# Patient Record
Sex: Female | Born: 1970 | Race: White | Hispanic: No | Marital: Married | Smoking: Never smoker
Health system: Southern US, Community
[De-identification: ages and names within clinical notes are randomized; demographics above are authoritative.]

## PROBLEM LIST (undated history)

## (undated) DIAGNOSIS — E079 Disorder of thyroid, unspecified: Secondary | ICD-10-CM

## (undated) DIAGNOSIS — C50919 Malignant neoplasm of unspecified site of unspecified female breast: Secondary | ICD-10-CM

## (undated) DIAGNOSIS — M1990 Unspecified osteoarthritis, unspecified site: Secondary | ICD-10-CM

## (undated) DIAGNOSIS — F329 Major depressive disorder, single episode, unspecified: Secondary | ICD-10-CM

## (undated) DIAGNOSIS — E288 Other ovarian dysfunction: Secondary | ICD-10-CM

## (undated) DIAGNOSIS — E2839 Other primary ovarian failure: Secondary | ICD-10-CM

## (undated) DIAGNOSIS — F32A Depression, unspecified: Secondary | ICD-10-CM

## (undated) DIAGNOSIS — Z923 Personal history of irradiation: Secondary | ICD-10-CM

## (undated) DIAGNOSIS — E349 Endocrine disorder, unspecified: Secondary | ICD-10-CM

## (undated) DIAGNOSIS — F419 Anxiety disorder, unspecified: Secondary | ICD-10-CM

## (undated) DIAGNOSIS — E039 Hypothyroidism, unspecified: Secondary | ICD-10-CM

## (undated) HISTORY — DX: Endocrine disorder, unspecified: E34.9

## (undated) HISTORY — PX: WISDOM TOOTH EXTRACTION: SHX21

## (undated) HISTORY — DX: Disorder of thyroid, unspecified: E07.9

## (undated) HISTORY — DX: Other ovarian dysfunction: E28.8

## (undated) HISTORY — DX: Other primary ovarian failure: E28.39

## (undated) HISTORY — DX: Malignant neoplasm of unspecified site of unspecified female breast: C50.919

---

## 2015-08-22 ENCOUNTER — Encounter: Payer: Self-pay | Admitting: Obstetrics and Gynecology

## 2015-08-22 ENCOUNTER — Ambulatory Visit (INDEPENDENT_AMBULATORY_CARE_PROVIDER_SITE_OTHER): Payer: Managed Care, Other (non HMO) | Admitting: Obstetrics and Gynecology

## 2015-08-22 VITALS — BP 100/66 | HR 50 | Resp 18 | Ht 62.5 in | Wt 142.8 lb

## 2015-08-22 DIAGNOSIS — Z0000 Encounter for general adult medical examination without abnormal findings: Secondary | ICD-10-CM | POA: Diagnosis not present

## 2015-08-22 DIAGNOSIS — N950 Postmenopausal bleeding: Secondary | ICD-10-CM | POA: Diagnosis not present

## 2015-08-22 DIAGNOSIS — Z23 Encounter for immunization: Secondary | ICD-10-CM

## 2015-08-22 DIAGNOSIS — Z01419 Encounter for gynecological examination (general) (routine) without abnormal findings: Secondary | ICD-10-CM | POA: Diagnosis not present

## 2015-08-22 DIAGNOSIS — E039 Hypothyroidism, unspecified: Secondary | ICD-10-CM | POA: Diagnosis not present

## 2015-08-22 LAB — CBC
HCT: 40.8 % (ref 35.0–45.0)
Hemoglobin: 13.9 g/dL (ref 11.7–15.5)
MCH: 31.1 pg (ref 27.0–33.0)
MCHC: 34.1 g/dL (ref 32.0–36.0)
MCV: 91.3 fL (ref 80.0–100.0)
MPV: 11.3 fL (ref 7.5–12.5)
Platelets: 211 10*3/uL (ref 140–400)
RBC: 4.47 MIL/uL (ref 3.80–5.10)
RDW: 13.6 % (ref 11.0–15.0)
WBC: 6.4 10*3/uL (ref 3.8–10.8)

## 2015-08-22 LAB — ESTRADIOL: Estradiol: 89 pg/mL

## 2015-08-22 LAB — POCT URINALYSIS DIPSTICK
Bilirubin, UA: NEGATIVE
Blood, UA: NEGATIVE
Glucose, UA: NEGATIVE
Ketones, UA: NEGATIVE
Leukocytes, UA: NEGATIVE
Nitrite, UA: NEGATIVE
Protein, UA: NEGATIVE
Urobilinogen, UA: NEGATIVE
pH, UA: 5

## 2015-08-22 LAB — FOLLICLE STIMULATING HORMONE: FSH: 89.6 m[IU]/mL

## 2015-08-22 MED ORDER — ESTRADIOL 0.05 MG/24HR TD PTTW: 1.0000 | MEDICATED_PATCH | TRANSDERMAL | Status: DC

## 2015-08-22 MED ORDER — LEVOTHYROXINE SODIUM 112 MCG PO TABS: 112.0000 ug | ORAL_TABLET | Freq: Every day | ORAL | Status: DC

## 2015-08-22 MED ORDER — PROGESTERONE MICRONIZED 200 MG PO CAPS: 200.0000 mg | ORAL_CAPSULE | Freq: Every day | ORAL | Status: DC

## 2015-08-22 NOTE — Progress Notes (Signed)
Patient ID: Michaela Roman, female   DOB: 1970/11/14, 45 y.o.   MRN: [id number] 45 y.o. [place] Married Caucasian female here for annual exam.    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 Has a menses twice a year.  Feels that there are really menses.  Some vaginal dryness issues.  Hot flashes are well controlled.  PCP:  None--just recently moved from [place]   Patient's last menstrual period was 08/07/2015 (exact date).     Period Pattern: (!) Irregular (usually only 2-3 times/year)     Sexually active: Yes.   female/female(currently female--husband) The current method of family planning is none.    Exercising: Yes.    cardio and weight training 5 days/week. Smoker:  no  Health Maintenance: Pap:  2016 normal per patient in [place] History of abnormal Pap:  no MMG:  2016 normal per [place] Colonoscopy:  n/a BMD:   2010  Result  Normal per [place] in [place]--performed due to hx of POF TDaP:  ?2005--WOULD LIKE TODAY Gardasil:   N/A   Screening Labs:   Urine today: Neg   reports that she has never smoked. She does not have any smokeless tobacco history on file. She reports that she drinks about 1.2 oz of alcohol per week. She reports that she does not use illicit drugs.  Past Medical History  Diagnosis Date  . Hormone disorder   . Thyroid disease     hypothyroid  . Premature ovarian failure     Dx'd age 39    History reviewed. No pertinent past surgical history.  Current Outpatient Prescriptions  Medication Sig Dispense Refill  . estradiol (VIVELLE-DOT) 0.05 MG/24HR patch Place 1 patch onto the skin 2 (two) times a week.    . levothyroxine (SYNTHROID, LEVOTHROID) 112 MCG tablet Take 112 mcg by mouth daily before breakfast.    . Multiple Vitamins-Minerals (MULTIVITAMIN PO) Take 1 tablet by mouth daily.    . Omega 3-6-9 Fatty Acids (OMEGA-3 FUSION) LIQD Take by mouth. Takes 1 tsp. daily    . progesterone (PROMETRIUM) 200 MG capsule Take 200 mg by mouth daily.     No  current facility-administered medications for this visit.    Family History  Problem Relation Age of Onset  . Thyroid disease Sister     hypothyroidism  . Hypertension Maternal Grandmother   . Heart disease Maternal Grandmother   . Diabetes Maternal Grandfather   . Other Maternal Grandfather     benign brain tumor  . Multiple sclerosis Father   . Dementia Paternal Grandmother     ROS:  Pertinent items are noted in HPI.  Otherwise, a comprehensive ROS was negative.  Exam:   BP 100/66 mmHg  Pulse 50  Resp 18  Ht 5' 2.5" (1.588 m)  Wt 142 lb 12.8 oz (64.774 kg)  BMI 25.69 kg/m2  LMP 08/07/2015 (Exact Date)    General appearance: alert, cooperative and appears stated age Head: Normocephalic, without obvious abnormality, atraumatic Neck: no adenopathy, supple, symmetrical, trachea midline and thyroid normal to inspection and palpation Lungs: clear to auscultation bilaterally Breasts: normal appearance, no masses or tenderness, Inspection negative, No nipple retraction or dimpling, No nipple discharge or bleeding, No axillary or supraclavicular adenopathy Heart: regular rate and rhythm Abdomen: incisions:  No.    , soft, non-tender; no masses, no organomegaly Extremities: extremities normal, atraumatic, no cyanosis or edema Skin: Skin color, texture, turgor normal. No rashes or lesions Lymph nodes: Cervical, supraclavicular, and axillary nodes normal. No  abnormal inguinal nodes palpated Neurologic: Grossly normal  Pelvic: External genitalia:  no lesions              Urethra:  normal appearing urethra with no masses, tenderness or lesions              Bartholins and Skenes: normal                 Vagina: normal appearing vagina with normal color and discharge, no lesions              Cervix: no lesions              Pap taken: No. Bimanual Exam:  Uterus:  normal size, contour, position, consistency, mobility, non-tender              Adnexa: normal adnexa and no mass, fullness,  tenderness              Rectal exam: Yes.  .  Confirms.              Anus:  normal sphincter tone, no lesions  Chaperone was present for exam.  Assessment:   Well woman visit with normal exam. Premature ovarian failure.  Postmenopausal bleeding on HRT. Hypothyroidism.  Plan: Yearly mammogram recommended after age 80. Information to do this at the [facility]. Recommended self breast exam.  Pap and HR HPV as above. Discussed Calcium, Vitamin D, regular exercise program including cardiovascular and weight bearing exercise. Labs performed.  Yes.  .   See orders.  Routine labs, TFTs, FSH, estradiol. Prescription medication(s) given.  Yes.  .  See orders.  Vivelle Dot 0.05 mg daily, Prometrium 200 mg daily, Synthroid 112 mcg daily.  I did discuss the benefits of HRT due to premature menopause. Rerturn for pelvic ultrasound. Explained the rationale.  This can evaluate for fibroids, polyps, endometrial hyperplasia and cancer.  She understands the importance of follow up. TDap today.  Follow up annually and prn.       After visit summary provided.

## 2015-08-23 LAB — THYROID PANEL WITH TSH
Free Thyroxine Index: 3 (ref 1.4–3.8)
T3 Uptake: 35 % (ref 22–35)
T4, Total: 8.5 ug/dL (ref 4.5–12.0)
TSH: 4.09 mIU/L

## 2015-08-23 LAB — LIPID PANEL
Cholesterol: 174 mg/dL (ref 125–200)
HDL: 74 mg/dL (ref >=46)
LDL Cholesterol: 88 mg/dL (ref <130)
Total CHOL/HDL Ratio: 2.4 Ratio (ref <=5.0)
Triglycerides: 62 mg/dL (ref <150)
VLDL: 12 mg/dL (ref <30)

## 2015-08-23 LAB — COMPREHENSIVE METABOLIC PANEL
ALT: 15 U/L (ref 6–29)
AST: 21 U/L (ref 10–30)
Albumin: 4.2 g/dL (ref 3.6–5.1)
Alkaline Phosphatase: 35 U/L (ref 33–115)
BUN: 15 mg/dL (ref 7–25)
CO2: 25 mmol/L (ref 20–31)
Calcium: 9.2 mg/dL (ref 8.6–10.2)
Chloride: 103 mmol/L (ref 98–110)
Creat: 0.95 mg/dL (ref 0.50–1.10)
Glucose, Bld: 85 mg/dL (ref 65–99)
Potassium: 4.1 mmol/L (ref 3.5–5.3)
Sodium: 139 mmol/L (ref 135–146)
Total Bilirubin: 0.6 mg/dL (ref 0.2–1.2)
Total Protein: 6.8 g/dL (ref 6.1–8.1)

## 2015-08-23 LAB — VITAMIN D 25 HYDROXY (VIT D DEFICIENCY, FRACTURES): Vit D, 25-Hydroxy: 32 ng/mL (ref 30–100)

## 2015-08-27 ENCOUNTER — Telehealth: Payer: Self-pay | Admitting: *Deleted

## 2015-08-27 DIAGNOSIS — N950 Postmenopausal bleeding: Secondary | ICD-10-CM

## 2015-08-27 NOTE — Telephone Encounter (Signed)
-----   Message from Nunzio Cobbs, MD sent at 08/24/2015 12:40 PM EDT ----- Please report to the patient her labs from her office visit.   She is menopausal by the [facility] level.  Her estradiol level is elevated, but she is using estrogen replacement (and progesterone) for premature menopause. I do recommend proceeding forward with her evaluation for postmenopausal bleeding now that I have seen the [facility] level. I already discussed with her and ordered a pelvic ultrasound, and I now would like to add a sonohysterogram and EMB.  These latter two will need to be explained to the patient and precerted. For this type of evaluation, I start with the pelvic ultrasound and then at that visit, determine the necessity of proceeding forward with the sonohysterogram and EMB depending on the findings of the ultrasound.  The remainder of her tests were normal - lipids, vitamin D, CBC, thyroid panel, and CMP.  Cc- Marisa Sprinkles

## 2015-08-27 NOTE — Telephone Encounter (Signed)
Patient notified of lab results as directed by Dr Quincy Simmonds. Advised of recommendation to add SHGM and endo biopsy to pelvic ultrasound. Procedures explained to patient. Instructed to take Motrin 800 mg one hour prior with food. Appointment scheduled for 09-04-15 at 1100 with Dr Quincy Simmonds.   Routing to provider for final review. Patient agreeable to disposition. Will close encounter.

## 2015-09-04 ENCOUNTER — Other Ambulatory Visit: Payer: Self-pay | Admitting: Obstetrics and Gynecology

## 2015-09-04 ENCOUNTER — Encounter: Payer: Self-pay | Admitting: Obstetrics and Gynecology

## 2015-09-04 ENCOUNTER — Ambulatory Visit (INDEPENDENT_AMBULATORY_CARE_PROVIDER_SITE_OTHER): Payer: Managed Care, Other (non HMO)

## 2015-09-04 ENCOUNTER — Ambulatory Visit (INDEPENDENT_AMBULATORY_CARE_PROVIDER_SITE_OTHER): Payer: Managed Care, Other (non HMO) | Admitting: Obstetrics and Gynecology

## 2015-09-04 VITALS — BP 102/60 | HR 64 | Resp 14 | Ht 62.5 in | Wt 142.0 lb

## 2015-09-04 DIAGNOSIS — N950 Postmenopausal bleeding: Secondary | ICD-10-CM | POA: Diagnosis not present

## 2015-09-04 DIAGNOSIS — N9489 Other specified conditions associated with female genital organs and menstrual cycle: Secondary | ICD-10-CM

## 2015-09-04 NOTE — Progress Notes (Signed)
GYNECOLOGY  VISIT   HPI: 45 y.o.   Married  Caucasian  female   G0P0000 with Patient's last menstrual period was 08/07/2015 (exact date).   here for   Postmenopausal bleeding.   Husband present for the discussion portion of the visit.  Hx premature menopause.  On Vivelle Dot and Prometrium. Bleeding today.  Switched patch late.  Recent labs: [place] - 89 E2 - 89.  GYNECOLOGIC HISTORY: Patient's last menstrual period was 08/07/2015 (exact date).   Menopausal hormone therapy:  Vivelle Dot, Prometrium. Last mammogram:  2016 - [place], [place]. Last pap smear:  2016 - [place], [place].        OB History    Gravida Para Term Preterm AB TAB SAB Ectopic Multiple Living   0 0 0 0 0 0 0 0 0 0          There are no active problems to display for this patient.   Past Medical History  Diagnosis Date  . Hormone disorder   . Thyroid disease     hypothyroid  . Premature ovarian failure     Dx'd age 42    History reviewed. No pertinent past surgical history.  Current Outpatient Prescriptions  Medication Sig Dispense Refill  . estradiol (VIVELLE-DOT) 0.05 MG/24HR patch Place 1 patch (0.05 mg total) onto the skin 2 (two) times a week. 8 patch 11  . levothyroxine (SYNTHROID, LEVOTHROID) 112 MCG tablet Take 1 tablet (112 mcg total) by mouth daily before breakfast. 30 tablet 11  . Multiple Vitamins-Minerals (MULTIVITAMIN PO) Take 1 tablet by mouth daily.    . Omega 3-6-9 Fatty Acids (OMEGA-3 FUSION) LIQD Take by mouth. Takes 1 tsp. daily    . progesterone (PROMETRIUM) 200 MG capsule Take 1 capsule (200 mg total) by mouth daily. 60 capsule 11   No current facility-administered medications for this visit.     ALLERGIES: Review of patient's allergies indicates no known allergies.  Family History  Problem Relation Age of Onset  . Thyroid disease Sister     hypothyroidism  . Hypertension Maternal Grandmother   . Heart disease Maternal Grandmother   . Diabetes Maternal Grandfather   . Other  Maternal Grandfather     benign brain tumor  . Multiple sclerosis Father   . Dementia Paternal Grandmother     Social History   Social History  . Marital Status: Married    Spouse Name: N/A  . Number of Children: N/A  . Years of Education: N/A   Occupational History  . Not on file.   Social History Main Topics  . Smoking status: Never Smoker   . Smokeless tobacco: Not on file  . Alcohol Use: 1.2 oz/week    2 Standard drinks or equivalent per week  . Drug Use: No  . Sexual Activity:    Partners: Female, Female    Birth Control/ Protection: None   Other Topics Concern  . Not on file   Social History Narrative    ROS:  Pertinent items are noted in HPI.  PHYSICAL EXAMINATION:    BP 102/60 mmHg  Pulse 64  Resp 14  Ht 5' 2.5" (1.588 m)  Wt 142 lb (64.411 kg)  BMI 25.54 kg/m2  LMP 08/07/2015 (Exact Date)    General appearance: alert, cooperative and appears stated age   Technique:  Both transabdominal and transvaginal ultrasound examinations of the pelvis were performed. Transabdominal technique was performed for global imaging of the pelvis including uterus, ovaries, adnexal regions, and pelvic cul-de-sac. It was  necessary to proceed with endovaginal exam following the abdominal ultrasound.  Transabdominal exam to visualize the endometrium and adnexa.  Color and duplex Doppler ultrasound was utilized to evaluate blood flow to the ovaries.   Pelvic ultrasound Uterus with no mass.  EMS - 5.11 mm. Normal ovaries.  No free fluid.  Procedure - sonohysterogram Consent performed. Speculum placed in vagina. Sterile prep of cervix with Hibiclens Cannula placed inside endometrial cavity without difficulty. Speculum removed. Sterile saline injected.      1        filling defect noted: 9 x 5 mm. Cannula removed. No complication.   Procedure - endometrial biopsy Consent performed. Speculum place in vagina.  Sterile prep of cervix with  Hibiclens Tenaculum to anterior  cervical lip.   Pipelle placed to     7     cm without difficulty twice. Tissue obtained and sent to pathology. Speculum removed.  No complications. Minimal EBL.  ASSESSMENT  Postmenopausal bleeding.  Endometrial mass.  On HRT.  PLAN  Discussion of postmenopausal bleeding, etiologies, evaluation and treatment.  Follow up EMB - sent to [facility].  Biopsy precautions given.  Discussion of hysteroscopy with Myosure polypectomy, dilation and curettage.  Risks, benefits, and alternatives reviewed. Risks include but are not limited to bleeding, infection, damage to surrounding organs including uterine perforation requiring hospitalization and laparoscopy, pulmonary edema, reaction to anesthesia, DVT, PE, death, need for further treatment and surgery including repeat hysteroscopy, hysterectomy or medical therapy.   Surgical expectations and recovery discussed.  Patient wishes to proceed.   An After Visit Summary was printed and given to the patient.  _25_____ minutes face to face time of which over 50% was spent in counseling.

## 2015-09-04 NOTE — Patient Instructions (Signed)

## 2015-09-11 ENCOUNTER — Telehealth: Payer: Self-pay | Admitting: Obstetrics and Gynecology

## 2015-09-11 NOTE — Telephone Encounter (Signed)
Please keep patient in the work queue and call her back in one week if she does not return the call to discuss scheduling.

## 2015-09-11 NOTE — Telephone Encounter (Signed)
Spoke with pt regarding benefit for recommended surgery. Patient understood and agreeable. Patient aware this is professional benefit only. Patient request to contact hospital for their benefits and states she will call back when she is ready to schedule.  Routing to Dr Quincy Simmonds  cc; Lamont Snowball

## 2015-09-12 ENCOUNTER — Encounter (HOSPITAL_COMMUNITY): Payer: Self-pay | Admitting: *Deleted

## 2015-09-12 NOTE — Telephone Encounter (Signed)
Call to patient. Surgery date options discussed. Patient requests 10-07-15. Surgery scheduled for 10-07-15 at [facility], time to be determined. Surgery instructions reviewed and printed surgery sheet will be mailed to patient. Questions answered and appointments scheduled.  Routing to provider for final review. Patient agreeable to disposition. Will close encounter.

## 2015-09-12 NOTE — Telephone Encounter (Signed)
Patient returned call and provided benefit in full. Patient ready to schedule. Forward to [place] for scheduling. Surgery chart to [facility].

## 2015-09-26 ENCOUNTER — Ambulatory Visit (INDEPENDENT_AMBULATORY_CARE_PROVIDER_SITE_OTHER): Payer: Managed Care, Other (non HMO) | Admitting: Obstetrics and Gynecology

## 2015-09-26 ENCOUNTER — Encounter: Payer: Self-pay | Admitting: Obstetrics and Gynecology

## 2015-09-26 VITALS — BP 110/70 | HR 56 | Ht 62.5 in

## 2015-09-26 DIAGNOSIS — N950 Postmenopausal bleeding: Secondary | ICD-10-CM

## 2015-09-26 DIAGNOSIS — N9489 Other specified conditions associated with female genital organs and menstrual cycle: Secondary | ICD-10-CM

## 2015-09-26 NOTE — Patient Instructions (Signed)
I will see you the day of surgery! 

## 2015-09-26 NOTE — Progress Notes (Signed)
GYNECOLOGY  VISIT   HPI: 45 y.o.   Married  Caucasian  female   G0P0000 with Patient's last menstrual period was 08/07/2015 (approximate).   here for surgical consult.     Endometrial mass noted on pelvic ultrasound/sonohysterogram ordered for postmenopausal bleeding. 9 x 5 mm mass noted. EMB showed benign proliferative endometrium.  Patient is on HRT for premature menopause. Recent labs: [place] - 89 E2 - 89.  Will do preop visit at hospital the day of the surgery.   GYNECOLOGIC HISTORY: Patient's last menstrual period was 08/07/2015 (approximate). Contraception:  Postmenopausal Menopausal hormone therapy:  Vivelle-Dot, Prometrium 200mg  Last mammogram:  [place] per [place] in [place] Last pap smear:   2016 [place]        OB History    Gravida Para Term Preterm AB Living   0 0 0 0 0 0   SAB TAB Ectopic Multiple Live Births   0 0 0 0           There are no active problems to display for this patient.   Past Medical History:  Diagnosis Date  . Anxiety    no meds  . Depression    no meds  . Hormone disorder   . Hypothyroidism   . Premature ovarian failure    Dx'd age 50  . Thyroid disease    hypothyroid    Past Surgical History:  Procedure Laterality Date  . WISDOM TOOTH EXTRACTION      Current Outpatient Prescriptions  Medication Sig Dispense Refill  . estradiol (VIVELLE-DOT) 0.05 MG/24HR patch Place 1 patch (0.05 mg total) onto the skin 2 (two) times a week. 8 patch 11  . levothyroxine (SYNTHROID, LEVOTHROID) 112 MCG tablet Take 1 tablet (112 mcg total) by mouth daily before breakfast. 30 tablet 11  . Multiple Vitamins-Minerals (MULTIVITAMIN PO) Take 1 tablet by mouth daily.    . Omega 3-6-9 Fatty Acids (OMEGA-3 FUSION) LIQD Take by mouth. Takes 1 tsp. daily    . progesterone (PROMETRIUM) 200 MG capsule Take 1 capsule (200 mg total) by mouth daily. 60 capsule 11   No current facility-administered medications for this visit.      ALLERGIES:  Review of patient's allergies indicates no known allergies.  Family History  Problem Relation Age of Onset  . Thyroid disease Sister     hypothyroidism  . Hypertension Maternal Grandmother   . Heart disease Maternal Grandmother   . Diabetes Maternal Grandfather   . Other Maternal Grandfather     benign brain tumor  . Multiple sclerosis Father   . Dementia Paternal Grandmother     Social History   Social History  . Marital status: Married    Spouse name: N/A  . Number of children: N/A  . Years of education: N/A   Occupational History  . Not on file.   Social History Main Topics  . Smoking status: Never Smoker  . Smokeless tobacco: Never Used  . Alcohol use 2.4 oz/week    2 Standard drinks or equivalent, 2 Glasses of wine per week  . Drug use: No  . Sexual activity: Yes    Partners: Female, Female    Birth control/ protection: None   Other Topics Concern  . Not on file   Social History Narrative  . No narrative on file    ROS:  Pertinent items are noted in HPI.  PHYSICAL EXAMINATION:    LMP 08/07/2015 (Approximate)     General appearance: alert, cooperative  and appears stated age Head: Normocephalic, without obvious abnormality, atraumatic Neck: no adenopathy, supple, symmetrical, trachea midline and thyroid normal to inspection and palpation Lungs: clear to auscultation bilaterally Breasts: normal appearance, no masses or tenderness, No nipple retraction or dimpling, No nipple discharge or bleeding, No axillary or supraclavicular adenopathy Heart: regular rate and rhythm Abdomen: soft, non-tender, no masses,  no organomegaly Extremities: extremities normal, atraumatic, no cyanosis or edema Skin: Skin color, texture, turgor normal. No rashes or lesions Lymph nodes: Cervical, supraclavicular, and axillary nodes normal. No abnormal inguinal nodes palpated Neurologic: Grossly normal  Pelvic: External genitalia:  no lesions              Urethra:  normal appearing  urethra with no masses, tenderness or lesions              Bartholins and Skenes: normal                 Vagina: normal appearing vagina with normal color and discharge, no lesions              Cervix: no lesions                Bimanual Exam:  Uterus:  normal size, contour, position, consistency, mobility, non-tender              Adnexa: no mass, fullness, tenderness              Chaperone was present for exam.  ASSESSMENT  Postmenopausal bleeding with endometrial mass and negative EMB. Premature menopause.  On HRT.  PLAN  Proceed with hysteroscopy with dilation and curettage.  Risks, benefits, and alternatives discussed with the patient who wishes to proceed.  I did review the possibility of a negative hysteroscopy. Ok to continue with HRT at this time.    An After Visit Summary was printed and given to the patient.  __15____ minutes face to face time of which over 50% was spent in counseling.

## 2015-10-01 ENCOUNTER — Telehealth: Payer: Self-pay | Admitting: *Deleted

## 2015-10-01 NOTE — Telephone Encounter (Signed)
Call to patient. Advised surgery time adjusted to 0730 and arrival at 0600 on 10-07-15 at [place] to provider for final review. Patient agreeable to disposition. Will close encounter.

## 2015-10-07 ENCOUNTER — Ambulatory Visit (HOSPITAL_COMMUNITY)
Admission: RE | Admit: 2015-10-07 | Discharge: 2015-10-07 | Disposition: A | Discharge status: Home / Self Care | Payer: Managed Care, Other (non HMO) | Source: Ambulatory Visit | Attending: Obstetrics and Gynecology | Admitting: Obstetrics and Gynecology

## 2015-10-07 ENCOUNTER — Encounter (HOSPITAL_COMMUNITY): Payer: Self-pay | Admitting: *Deleted

## 2015-10-07 ENCOUNTER — Encounter (HOSPITAL_COMMUNITY)
Admission: RE | Disposition: A | Discharge status: Home / Self Care | Payer: Self-pay | Source: Ambulatory Visit | Attending: Obstetrics and Gynecology

## 2015-10-07 ENCOUNTER — Ambulatory Visit (HOSPITAL_COMMUNITY): Payer: Managed Care, Other (non HMO) | Admitting: Certified Registered Nurse Anesthetist

## 2015-10-07 DIAGNOSIS — E039 Hypothyroidism, unspecified: Secondary | ICD-10-CM | POA: Insufficient documentation

## 2015-10-07 DIAGNOSIS — Z79890 Hormone replacement therapy (postmenopausal): Secondary | ICD-10-CM | POA: Insufficient documentation

## 2015-10-07 DIAGNOSIS — N840 Polyp of corpus uteri: Secondary | ICD-10-CM | POA: Insufficient documentation

## 2015-10-07 DIAGNOSIS — N950 Postmenopausal bleeding: Secondary | ICD-10-CM | POA: Insufficient documentation

## 2015-10-07 HISTORY — DX: Hypothyroidism, unspecified: E03.9

## 2015-10-07 HISTORY — DX: Depression, unspecified: F32.A

## 2015-10-07 HISTORY — PX: DILATATION & CURETTAGE/HYSTEROSCOPY WITH MYOSURE: SHX6511

## 2015-10-07 HISTORY — DX: Anxiety disorder, unspecified: F41.9

## 2015-10-07 HISTORY — DX: Major depressive disorder, single episode, unspecified: F32.9

## 2015-10-07 LAB — CBC
HCT: 37.7 % (ref 36.0–46.0)
Hemoglobin: 13.3 g/dL (ref 12.0–15.0)
MCH: 31.6 pg (ref 26.0–34.0)
MCHC: 35.3 g/dL (ref 30.0–36.0)
MCV: 89.5 fL (ref 78.0–100.0)
Platelets: 196 10*3/uL (ref 150–400)
RBC: 4.21 MIL/uL (ref 3.87–5.11)
RDW: 13 % (ref 11.5–15.5)
WBC: 5.4 10*3/uL (ref 4.0–10.5)

## 2015-10-07 LAB — PREGNANCY, URINE: Preg Test, Ur: NEGATIVE

## 2015-10-07 SURGERY — DILATATION & CURETTAGE/HYSTEROSCOPY WITH MYOSURE
Anesthesia: Choice

## 2015-10-07 MED ORDER — DEXAMETHASONE SODIUM PHOSPHATE 10 MG/ML IJ SOLN
INTRAMUSCULAR | Status: DC | PRN
  Administered 2015-10-07: 4 mg via INTRAVENOUS

## 2015-10-07 MED ORDER — SCOPOLAMINE 1 MG/3DAYS TD PT72
1.0000 | MEDICATED_PATCH | Freq: Once | TRANSDERMAL | Status: DC
  Administered 2015-10-07: 1.5 mg via TRANSDERMAL

## 2015-10-07 MED ORDER — LACTATED RINGERS IV SOLN
INTRAVENOUS | Status: DC
  Administered 2015-10-07: 125 mL/h via INTRAVENOUS

## 2015-10-07 MED ORDER — KETOROLAC TROMETHAMINE 30 MG/ML IJ SOLN
INTRAMUSCULAR | Status: AC
  Filled 2015-10-07: qty 1

## 2015-10-07 MED ORDER — GLYCOPYRROLATE 0.2 MG/ML IJ SOLN
INTRAMUSCULAR | Status: DC | PRN
  Administered 2015-10-07: 0.2 mg via INTRAVENOUS

## 2015-10-07 MED ORDER — MIDAZOLAM HCL 2 MG/2ML IJ SOLN
INTRAMUSCULAR | Status: DC | PRN
  Administered 2015-10-07: 2 mg via INTRAVENOUS

## 2015-10-07 MED ORDER — LIDOCAINE HCL (CARDIAC) 20 MG/ML IV SOLN
INTRAVENOUS | Status: DC | PRN
  Administered 2015-10-07: 100 mg via INTRAVENOUS

## 2015-10-07 MED ORDER — LIDOCAINE HCL 1 % IJ SOLN
INTRAMUSCULAR | Status: DC | PRN
  Administered 2015-10-07: 10 mL

## 2015-10-07 MED ORDER — MEPERIDINE HCL 25 MG/ML IJ SOLN: 6.2500 mg | INTRAMUSCULAR | Status: DC | PRN

## 2015-10-07 MED ORDER — MIDAZOLAM HCL 2 MG/2ML IJ SOLN
INTRAMUSCULAR | Status: AC
  Filled 2015-10-07: qty 2

## 2015-10-07 MED ORDER — HYDROMORPHONE HCL 1 MG/ML IJ SOLN
INTRAMUSCULAR | Status: DC
Start: 2015-10-07 — End: 2015-10-07
  Filled 2015-10-07: qty 1

## 2015-10-07 MED ORDER — PROPOFOL 10 MG/ML IV BOLUS
INTRAVENOUS | Status: AC
  Filled 2015-10-07: qty 20

## 2015-10-07 MED ORDER — LIDOCAINE HCL 1 % IJ SOLN
INTRAMUSCULAR | Status: AC
  Filled 2015-10-07: qty 20

## 2015-10-07 MED ORDER — GLYCOPYRROLATE 0.2 MG/ML IJ SOLN
INTRAMUSCULAR | Status: AC
  Filled 2015-10-07: qty 1

## 2015-10-07 MED ORDER — ONDANSETRON HCL 4 MG/2ML IJ SOLN: 4.0000 mg | Freq: Once | INTRAMUSCULAR | Status: DC | PRN

## 2015-10-07 MED ORDER — SODIUM CHLORIDE 0.9 % IR SOLN
Status: DC | PRN
  Administered 2015-10-07: 3000 mL

## 2015-10-07 MED ORDER — FENTANYL CITRATE (PF) 100 MCG/2ML IJ SOLN
INTRAMUSCULAR | Status: DC | PRN
  Administered 2015-10-07: 25 ug via INTRAVENOUS

## 2015-10-07 MED ORDER — ONDANSETRON HCL 4 MG/2ML IJ SOLN
INTRAMUSCULAR | Status: DC | PRN
  Administered 2015-10-07: 4 mg via INTRAVENOUS

## 2015-10-07 MED ORDER — FENTANYL CITRATE (PF) 100 MCG/2ML IJ SOLN
INTRAMUSCULAR | Status: AC
  Filled 2015-10-07: qty 2

## 2015-10-07 MED ORDER — KETOROLAC TROMETHAMINE 30 MG/ML IJ SOLN
INTRAMUSCULAR | Status: DC | PRN
  Administered 2015-10-07: 30 mg via INTRAVENOUS

## 2015-10-07 MED ORDER — SCOPOLAMINE 1 MG/3DAYS TD PT72
MEDICATED_PATCH | TRANSDERMAL | Status: AC
  Administered 2015-10-07: 1.5 mg via TRANSDERMAL
  Filled 2015-10-07: qty 1

## 2015-10-07 MED ORDER — IBUPROFEN 800 MG PO TABS: 800.0000 mg | ORAL_TABLET | Freq: Three times a day (TID) | ORAL | 0 refills | Status: DC | PRN

## 2015-10-07 MED ORDER — PROPOFOL 10 MG/ML IV BOLUS
INTRAVENOUS | Status: DC | PRN
  Administered 2015-10-07: 200 mg via INTRAVENOUS

## 2015-10-07 MED ORDER — LACTATED RINGERS IV SOLN
INTRAVENOUS | Status: DC
  Administered 2015-10-07: 07:00:00 via INTRAVENOUS

## 2015-10-07 MED ORDER — ONDANSETRON HCL 4 MG/2ML IJ SOLN
INTRAMUSCULAR | Status: AC
  Filled 2015-10-07: qty 2

## 2015-10-07 MED ORDER — DEXAMETHASONE SODIUM PHOSPHATE 4 MG/ML IJ SOLN
INTRAMUSCULAR | Status: AC
  Filled 2015-10-07: qty 1

## 2015-10-07 MED ORDER — LIDOCAINE HCL (CARDIAC) 20 MG/ML IV SOLN
INTRAVENOUS | Status: AC
  Filled 2015-10-07: qty 5

## 2015-10-07 MED ORDER — HYDROMORPHONE HCL 1 MG/ML IJ SOLN
0.2500 mg | INTRAMUSCULAR | Status: DC | PRN
  Administered 2015-10-07: 0.25 mg via INTRAVENOUS

## 2015-10-07 SURGICAL SUPPLY — 20 items
CANISTER SUCT 3000ML (MISCELLANEOUS) ×4 IMPLANT
CATH ROBINSON RED A/P 16FR (CATHETERS) ×2 IMPLANT
CLOTH BEACON ORANGE TIMEOUT ST (SAFETY) ×2 IMPLANT
CONTAINER PREFILL 10% NBF 60ML (FORM) ×4 IMPLANT
DEVICE MYOSURE LITE (MISCELLANEOUS) ×2 IMPLANT
DEVICE MYOSURE REACH (MISCELLANEOUS) IMPLANT
ELECT REM PT RETURN 9FT ADLT (ELECTROSURGICAL)
ELECTRODE REM PT RTRN 9FT ADLT (ELECTROSURGICAL) IMPLANT
FILTER ARTHROSCOPY CONVERTOR (FILTER) ×2 IMPLANT
GLOVE BIO SURGEON STRL SZ 6.5 (GLOVE) ×2 IMPLANT
GLOVE BIOGEL PI IND STRL 7.0 (GLOVE) ×2 IMPLANT
GLOVE BIOGEL PI INDICATOR 7.0 (GLOVE) ×2
GOWN STRL REUS W/TWL LRG LVL3 (GOWN DISPOSABLE) ×4 IMPLANT
PACK VAGINAL MINOR WOMEN LF (CUSTOM PROCEDURE TRAY) ×2 IMPLANT
PAD OB MATERNITY 4.3X12.25 (PERSONAL CARE ITEMS) ×2 IMPLANT
SEAL ROD LENS SCOPE MYOSURE (ABLATOR) ×2 IMPLANT
TOWEL OR 17X24 6PK STRL BLUE (TOWEL DISPOSABLE) ×4 IMPLANT
TUBING AQUILEX INFLOW (TUBING) ×2 IMPLANT
TUBING AQUILEX OUTFLOW (TUBING) ×2 IMPLANT
WATER STERILE IRR 1000ML POUR (IV SOLUTION) ×2 IMPLANT

## 2015-10-07 NOTE — Progress Notes (Signed)
Update to History and Physical  Has had some continued bleeding.   Patient examined.   OK to proceed with surgery.

## 2015-10-07 NOTE — Anesthesia Procedure Notes (Signed)
Procedure Name: LMA Insertion Date/Time: 10/07/2015 7:20 AM Performed by: Hewitt Blade Pre-anesthesia Checklist: Patient identified, Emergency Drugs available, Suction available and Patient being monitored Patient Re-evaluated:Patient Re-evaluated prior to inductionOxygen Delivery Method: Circle system utilized Preoxygenation: Pre-oxygenation with 100% oxygen Intubation Type: IV induction LMA Size: 4.0 Number of attempts: 1 Placement Confirmation: breath sounds checked- equal and bilateral and positive ETCO2 Tube secured with: Tape Dental Injury: Teeth and Oropharynx as per pre-operative assessment

## 2015-10-07 NOTE — H&P (Signed)
Office Visit   09/26/2015 [place], MD  Obstetrics and Gynecology   Postmenopausal bleeding +1 more  Dx   Surgical Consult   Reason for Visit   Progress Notes     GYNECOLOGY  VISIT   HPI: 45 y.o.   Married  Caucasian  female   G0P0000 with Patient's last menstrual period was 08/07/2015 (approximate).   here for surgical consult.     Endometrial mass noted on pelvic ultrasound/sonohysterogram ordered for postmenopausal bleeding. 9 x 5 mm mass noted. EMB showed benign proliferative endometrium.  Patient is on HRT for premature menopause. Recent labs: [place] - 89 E2 - 89.  Will do preop visit at hospital the day of the surgery.   GYNECOLOGIC HISTORY: Patient's last menstrual period was 08/07/2015 (approximate). Contraception:  Postmenopausal Menopausal hormone therapy:  Vivelle-Dot, Prometrium 200mg  Last mammogram:  2016 [place] in [place] Last pap smear:   2016 [place] in [place]                OB History    Gravida Para Term Preterm AB Living   0 0 0 0 0 0   SAB TAB Ectopic Multiple Live Births   0 0 0 0           There are no active problems to display for this patient.       Past Medical History:  Diagnosis Date  . Anxiety    no meds  . Depression    no meds  . Hormone disorder   . Hypothyroidism   . Premature ovarian failure    Dx'd age 3  . Thyroid disease    hypothyroid         Past Surgical History:  Procedure Laterality Date  . WISDOM TOOTH EXTRACTION            Current Outpatient Prescriptions  Medication Sig Dispense Refill  . estradiol (VIVELLE-DOT) 0.05 MG/24HR patch Place 1 patch (0.05 mg total) onto the skin 2 (two) times a week. 8 patch 11  . levothyroxine (SYNTHROID, LEVOTHROID) 112 MCG tablet Take 1 tablet (112 mcg total) by mouth daily before breakfast. 30 tablet 11  . Multiple Vitamins-Minerals (MULTIVITAMIN PO) Take 1 tablet by mouth daily.     . Omega 3-6-9 Fatty Acids (OMEGA-3 FUSION) LIQD Take by mouth. Takes 1 tsp. daily    . progesterone (PROMETRIUM) 200 MG capsule Take 1 capsule (200 mg total) by mouth daily. 60 capsule 11   No current facility-administered medications for this visit.      ALLERGIES: Review of patient's allergies indicates no known allergies.        Family History  Problem Relation Age of Onset  . Thyroid disease Sister     hypothyroidism  . Hypertension Maternal Grandmother   . Heart disease Maternal Grandmother   . Diabetes Maternal Grandfather   . Other Maternal Grandfather     benign brain tumor  . Multiple sclerosis Father   . Dementia Paternal Grandmother     Social History        Social History  . Marital status: Married    Spouse name: N/A  . Number of children: N/A  . Years of education: N/A      Occupational History  . Not on file.        Social History Main Topics  . Smoking status: Never Smoker  . Smokeless tobacco: Never Used  . Alcohol use 2.4  oz/week    2 Standard drinks or equivalent, 2 Glasses of wine per week  . Drug use: No  . Sexual activity: Yes    Partners: Female, Female    Birth control/ protection: None       Other Topics Concern  . Not on file      Social History Narrative  . No narrative on file    ROS:  Pertinent items are noted in HPI.  PHYSICAL EXAMINATION:    LMP 08/07/2015 (Approximate)     General appearance: alert, cooperative and appears stated age Head: Normocephalic, without obvious abnormality, atraumatic Neck: no adenopathy, supple, symmetrical, trachea midline and thyroid normal to inspection and palpation Lungs: clear to auscultation bilaterally Breasts: normal appearance, no masses or tenderness, No nipple retraction or dimpling, No nipple discharge or bleeding, No axillary or supraclavicular adenopathy Heart: regular rate and rhythm Abdomen: soft, non-tender, no masses,  no  organomegaly Extremities: extremities normal, atraumatic, no cyanosis or edema Skin: Skin color, texture, turgor normal. No rashes or lesions Lymph nodes: Cervical, supraclavicular, and axillary nodes normal. No abnormal inguinal nodes palpated Neurologic: Grossly normal  Pelvic: External genitalia:  no lesions              Urethra:  normal appearing urethra with no masses, tenderness or lesions              Bartholins and Skenes: normal                 Vagina: normal appearing vagina with normal color and discharge, no lesions              Cervix: no lesions                Bimanual Exam:  Uterus:  normal size, contour, position, consistency, mobility, non-tender              Adnexa: no mass, fullness, tenderness              Chaperone was present for exam.  ASSESSMENT  Postmenopausal bleeding with endometrial mass and negative EMB. Premature menopause.  On HRT.  PLAN  Proceed with hysteroscopy with dilation and curettage.  Risks, benefits, and alternatives discussed with the patient who wishes to proceed.  I did review the possibility of a negative hysteroscopy. Ok to continue with HRT at this time.    An After Visit Summary was printed and given to the patient.  __15____ minutes face to face time of which over 50% was spent in counseling.

## 2015-10-07 NOTE — Anesthesia Postprocedure Evaluation (Signed)
Anesthesia Post Note  Patient: Shawn Eisenstadt  Procedure(s) Performed: Procedure(s) (LRB): DILATATION & CURETTAGE/HYSTEROSCOPY WITH MYOSURE (N/A)  Patient location during evaluation: PACU Anesthesia Type: General Level of consciousness: awake and alert Pain management: pain level controlled Vital Signs Assessment: post-procedure vital signs reviewed and stable Respiratory status: spontaneous breathing, nonlabored ventilation, respiratory function stable and patient connected to nasal cannula oxygen Cardiovascular status: blood pressure returned to baseline and stable Postop Assessment: no signs of nausea or vomiting Anesthetic complications: no     Last Vitals:  Vitals:   10/07/15 0900 10/07/15 0915  BP: 107/65 105/69  Pulse: (!) 46 (!) 57  Resp: 14 13  Temp:  36.8 C    Last Pain:  Vitals:   10/07/15 0915  TempSrc:   PainSc: 2    Pain Goal: Patients Stated Pain Goal: 4 (10/07/15 0915)               Azyria Osmon DAVID

## 2015-10-07 NOTE — Op Note (Signed)
OPERATIVE REPORT   PREOPERATIVE DIAGNOSES:   Postmenopausal bleeding, endometrial polyp.  POSTOPERATIVE DIAGNOSES:   Postmenopausal bleeding, endometrial polyp.  PROCEDURE:  Hysteroscopy with dilation and curettage and Myosure resection of endometrial polyp.  SURGEON:  Lenard Galloway, MD  ANESTHESIA:  LMA, paracervical block with 10 mL of 1% lidocaine.  IV FLUIDS:   700 cc LR.  EBL:  5 cc.  URINE OUTPUT:   50 cc.  NORMAL SALINE DEFICIT:   None.  COMPLICATIONS:  None.  INDICATIONS FOR THE PROCEDURE:     The patient is a 45 year old G82 Caucasian female who presents with postmenopausal bleeding on hormone replacement therapy for premature menopause.  Sonohysterogram in the office diagnosed a 9 x 5 mm endometrial mass.   A plan is now made to proceed with a hysteroscopy with dilation and curettage and Myosure resection of endometrial polyp after risks, benefits and alternatives were reviewed.  FINDINGS:  Exam under anesthesia revealed a small anteverted, mobile uterus. The uterus was sounded to 8 cm. Hysteroscopy showed a 10 x 5 mm polyp attached to the right lateral lower uterine segment near the junction of the cervix. Endometrial currettings were scanty.  SPECIMENS:  The endometrial polyp and endometrial curettings were sent to Pathology separately.   PROCEDURE IN DETAIL:  The patient was reidentified in the preoperative hold area.  She received TED hose and PAS stockings for DVT prophylaxis.  In the operating room, the patient was placed in the dorsal lithotomy position and then an LMA anesthetic was introduced.  The patient's lower abdomen, vagina and perineum were sterilely prepped with Betadine and the  patient's bladder was catheterized of urine.  She was sterilly draped  An exam under anesthesia was performed.  A speculum was placed inside the vagina and a single-tooth tenaculum was placed on the anterior cervical lip.  A paracervical  block was performed with a total of 10 mL of 1% lidocaine plain.  The uterus was sounded. The cervix was dilated to a #23 Pratt dilator.  The MyoSure hysteroscope was then inserted inside the uterine cavity under the continuous infusion of normal saline solution.  Findings are as noted above.  The MyoSure hysteroscope was used to resect the polyp, and it was then removed.  The Specimen was sent to Pathology.  The serrated and then sharp curette were introduced  into the uterine cavity and the endometrium was curetted in all 4 quadrants.  A minimal amount of endometrial curettings was obtained.  This specimen was sent to Pathology.  The single-tooth tenaculum which had been placed on the anterior cervical lip was removed.  Hemostasis was good, and all of the vaginal instruments were  removed.  The patient was awakened and escorted to the recovery room in stable condition after she was cleansed with Betadine.  There were no complications to the procedure.  All needle, instrument and sponge counts were correct.  Lenard Galloway, MD

## 2015-10-07 NOTE — Discharge Instructions (Signed)
Post Anesthesia Home Care Instructions  Activity: Get plenty of rest for the remainder of the day. A responsible adult should stay with you for 24 hours following the procedure.  For the next 24 hours, DO NOT: -Drive a car -Paediatric nurse -Drink alcoholic beverages -Take any medication unless instructed by your physician -Make any legal decisions or sign important papers.  Meals: Start with liquid foods such as gelatin or soup. Progress to regular foods as tolerated. Avoid greasy, spicy, heavy foods. If nausea and/or vomiting occur, drink only clear liquids until the nausea and/or vomiting subsides. Call your physician if vomiting continues.  Special Instructions/Symptoms: Your throat may feel dry or sore from the anesthesia or the breathing tube placed in your throat during surgery. If this causes discomfort, gargle with warm salt water. The discomfort should disappear within 24 hours.  If you had a scopolamine patch placed behind your ear for the management of post- operative nausea and/or vomiting:  1. The medication in the patch is effective for 72 hours, after which it should be removed.  Wrap patch in a tissue and discard in the trash. Wash hands thoroughly with soap and water. 2. You may remove the patch earlier than 72 hours if you experience unpleasant side effects which may include dry mouth, dizziness or visual disturbances. 3. Avoid touching the patch. Wash your hands with soap and water after contact with the patch.   Hysteroscopy, Care After Refer to this sheet in the next few weeks. These instructions provide you with information on caring for yourself after your procedure. Your health care provider may also give you more specific instructions. Your treatment has been planned according to current medical practices, but problems sometimes occur. Call your health care provider if you have any problems or questions after your procedure.  WHAT TO EXPECT AFTER THE  PROCEDURE After your procedure, it is typical to have the following:  You may have some cramping. This normally lasts for a couple days.  You may have bleeding. This can vary from light spotting for a few days to menstrual-like bleeding for 3-7 days. HOME CARE INSTRUCTIONS  Rest for the first 1-2 days after the procedure.  Only take over-the-counter or prescription medicines as directed by your health care provider. Do not take aspirin. It can increase the chances of bleeding.  Take showers instead of baths for 2 weeks or as directed by your health care provider.  Do not drive for 24 hours or as directed.  Do not drink alcohol while taking pain medicine.  Do not use tampons, douche, or have sexual intercourse for 2 weeks or until your health care provider says it is okay.  Take your temperature twice a day for 4-5 days. Write it down each time.  Follow your health care provider's advice about diet, exercise, and lifting.  If you develop constipation, you may:  Take a mild laxative if your health care provider approves.  Add bran foods to your diet.  Drink enough fluids to keep your urine clear or pale yellow.  Try to have someone with you or available to you for the first 24-48 hours, especially if you were given a general anesthetic.  Follow up with your health care provider as directed. SEEK MEDICAL CARE IF:  You feel dizzy or lightheaded.  You feel sick to your stomach (nauseous).  You have abnormal vaginal discharge.  You have a rash.  You have pain that is not controlled with medicine. SEEK IMMEDIATE MEDICAL CARE IF:  You have bleeding that is heavier than a normal menstrual period.  You have a fever.  You have increasing cramps or pain, not controlled with medicine.  You have new belly (abdominal) pain.  You pass out.  You have pain in the tops of your shoulders (shoulder strap areas).  You have shortness of breath.   This information is not  intended to replace advice given to you by your health care provider. Make sure you discuss any questions you have with your health care provider.   Document Released: 12/06/2012 Document Reviewed: 12/06/2012 Elsevier Interactive Patient Education 2016 Else[employer]  Dilation and Curettage or Vacuum Curettage, Care After Refer to this sheet in the next few weeks. These instructions provide you with information on caring for yourself after your procedure. Your health care provider may also give you more specific instructions. Your treatment has been planned according to current medical practices, but problems sometimes occur. Call your health care provider if you have any problems or questions after your procedure. WHAT TO EXPECT AFTER THE PROCEDURE After your procedure, it is typical to have light cramping and bleeding. This may last for 2 days to 2 weeks after the procedure. HOME CARE INSTRUCTIONS   Do not drive for 24 hours.  Wait 1 week before returning to strenuous activities.  Take your temperature 2 times a day for 4 days and write it down. Provide these temperatures to your health care provider if you develop a fever.  Avoid long periods of standing.  Avoid heavy lifting, pushing, or pulling. Do not lift anything heavier than 10 pounds (4.5 kg).  Limit stair climbing to once or twice a day.  Take rest periods often.  You may resume your usual diet.  Drink enough fluids to keep your urine clear or pale yellow.  Your usual bowel function should return. If you have constipation, you may:  Take a mild laxative with permission from your health care provider.  Add fruit and bran to your diet.  Drink more fluids.  Take showers instead of baths until your health care provider gives you permission to take baths.  Do not go swimming or use a hot tub until your health care provider approves.  Try to have someone with you or available to you the first 24-48 hours, especially if  you were given a general anesthetic.  Do not douche, use tampons, or have sex (intercourse) for 2 weeks after the procedure.  Only take over-the-counter or prescription medicines as directed by your health care provider. Do not take aspirin. It can cause bleeding.  Follow up with your health care provider as directed. SEEK MEDICAL CARE IF:   You have increasing cramps or pain that is not relieved with medicine.  You have abdominal pain that does not seem to be related to the same area of earlier cramping and pain.  You have bad smelling vaginal discharge.  You have a rash.  You are having problems with any medicine. SEEK IMMEDIATE MEDICAL CARE IF:   You have bleeding that is heavier than a normal menstrual period.  You have a fever.  You have chest pain.  You have shortness of breath.  You feel dizzy or feel like fainting.  You pass out.  You have pain in your shoulder strap area.  You have heavy vaginal bleeding with or without blood clots. MAKE SURE YOU:   Understand these instructions.  Will watch your condition.  Will get help right away if you are not  doing well or get worse.   This information is not intended to replace advice given to you by your health care provider. Make sure you discuss any questions you have with your health care provider.   Document Released: 02/13/2000 Document Revised: 02/20/2013 Document Reviewed: 09/14/2012 Elsevier Interactive Patient Education [employer].

## 2015-10-07 NOTE — Anesthesia Preprocedure Evaluation (Signed)
Anesthesia Evaluation    Reviewed: Allergy & Precautions, NPO status , Patient's Chart, lab work & pertinent test results  Airway Mallampati: I  TM Distance: >3 FB Neck ROM: Full    Dental   Pulmonary    Pulmonary exam normal        Cardiovascular Normal cardiovascular exam     Neuro/Psych Anxiety Depression    GI/Hepatic   Endo/Other  Hypothyroidism   Renal/GU      Musculoskeletal   Abdominal   Peds  Hematology   Anesthesia Other Findings   Reproductive/Obstetrics                             Anesthesia Physical Anesthesia Plan  ASA: II  Anesthesia Plan: General   Post-op Pain Management:    Induction: Intravenous  Airway Management Planned: LMA  Additional Equipment:   Intra-op Plan:   Post-operative Plan: Extubation in OR  Informed Consent: I have reviewed the patients History and Physical, chart, labs and discussed the procedure including the risks, benefits and alternatives for the proposed anesthesia with the patient or authorized representative who has indicated his/her understanding and acceptance.     Plan Discussed with: CRNA and Surgeon  Anesthesia Plan Comments:         Anesthesia Quick Evaluation

## 2015-10-07 NOTE — Transfer of Care (Signed)
Immediate Anesthesia Transfer of Care Note  Patient: Michaela Roman  Procedure(s) Performed: Procedure(s) with comments: DILATATION & CURETTAGE/HYSTEROSCOPY WITH MYOSURE (N/A) - endometrial polyp  Patient Location: PACU  Anesthesia Type:General  Level of Consciousness: awake, alert  and oriented  Airway & Oxygen Therapy: Patient Spontanous Breathing and Patient connected to nasal cannula oxygen  Post-op Assessment: Report given to RN, Post -op Vital signs reviewed and stable and Patient moving all extremities  Post vital signs: Reviewed and stable  Last Vitals:  Vitals:   10/07/15 0627  BP: 102/72  Pulse: (!) 57  Resp: 18  Temp: 36.7 C    Last Pain:  Vitals:   10/07/15 0627  TempSrc: Oral      Patients Stated Pain Goal: 4 ([id number])  Complications: No apparent anesthesia complications

## 2015-10-08 ENCOUNTER — Encounter (HOSPITAL_COMMUNITY): Payer: Self-pay | Admitting: Obstetrics and Gynecology

## 2015-10-17 ENCOUNTER — Telehealth: Payer: Self-pay | Admitting: Obstetrics and Gynecology

## 2015-10-17 NOTE — Telephone Encounter (Signed)
Spoke with patient and she states had D & C/Hysteroscopy/Myosure on 10-07-15. Patient states bleeding stopped on 10-11-15 but began having light bright red vaginal bleeding again last PM. Has only changed panty liner once since last night. No temp.,only mild cramping. Patient just wants to make sure this is okay. She wasn't sure how long she should expect to see bleeding. Would discuss with Dr. Sabra Heck and call her back. Routed to provider.

## 2015-10-17 NOTE — Telephone Encounter (Signed)
This is common.  Please give bleeding precautions.  Ok to watch.

## 2015-10-17 NOTE — Telephone Encounter (Signed)
Called patient and advised per Dr. Ammie Ferrier message. Advised if she develops fever, severe abdominal pain or changing a pad every hour--she should call office. Patient voiced understanding and thanked me for calling her.

## 2015-10-17 NOTE — Telephone Encounter (Signed)
Patient said "I am having some unusual  after surgery and would like to talk with a nurse".

## 2015-10-24 ENCOUNTER — Encounter: Payer: Self-pay | Admitting: Obstetrics and Gynecology

## 2015-10-24 ENCOUNTER — Ambulatory Visit (INDEPENDENT_AMBULATORY_CARE_PROVIDER_SITE_OTHER): Payer: Managed Care, Other (non HMO) | Admitting: Obstetrics and Gynecology

## 2015-10-24 VITALS — BP 100/62 | HR 50 | Ht 62.5 in | Wt 146.6 lb

## 2015-10-24 DIAGNOSIS — Z9889 Other specified postprocedural states: Secondary | ICD-10-CM

## 2015-10-24 NOTE — Progress Notes (Signed)
GYNECOLOGY  VISIT   HPI: 45 y.o.   Married  Caucasian  female   [id number] with Patient's last menstrual period was 08/07/2015 (approximate).   here for 2 week follow up [place] (N/A ).  Final pathology showed benign endometrial polyp.   Bled for a few days after surgery.  Stopped and then started again.   Is on her HRT.   Patient complains of bilateral low back discomfort for past 2-3 days. Patient denies fever, nausea, or any urinary symptoms.  Increased activity in the last week.   Just bought a house.   GYNECOLOGIC HISTORY: Patient's last menstrual period was 08/07/2015 (approximate). Contraception:  postmenopausal Menopausal hormone therapy:  Vivelle Dot, Prometrium 200mg  Last mammogram:  2016 normal per patient in [place] Last pap smear:   2016 normal per patient in [place]        OB History    Gravida Para Term Preterm AB Living   0 0 0 0 0 0   SAB TAB Ectopic Multiple Live Births   0 0 0 0           There are no active problems to display for this patient.   Past Medical History:  Diagnosis Date  . Anxiety    no meds  . Depression    no meds  . Hormone disorder   . Hypothyroidism   . Premature ovarian failure    Dx'd age 65  . Thyroid disease    hypothyroid    Past Surgical History:  Procedure Laterality Date  . DILATATION & CURETTAGE/HYSTEROSCOPY WITH MYOSURE N/A 10/07/2015   Procedure: DILATATION & CURETTAGE/HYSTEROSCOPY WITH MYOSURE;  Surgeon: Nunzio Cobbs, MD;  Location: [facility];  Service: Gynecology;  Laterality: N/A;  endometrial polyp  . WISDOM TOOTH EXTRACTION      Current Outpatient Prescriptions  Medication Sig Dispense Refill  . estradiol (VIVELLE-DOT) 0.05 MG/24HR patch Place 1 patch (0.05 mg total) onto the skin 2 (two) times a week. 8 patch 11  . ibuprofen (ADVIL,MOTRIN) 800 MG tablet Take 1 tablet (800 mg total) by mouth every 8 (eight) hours as needed for cramping. 30 tablet 0  . levothyroxine  (SYNTHROID, LEVOTHROID) 112 MCG tablet Take 1 tablet (112 mcg total) by mouth daily before breakfast. 30 tablet 11  . Multiple Vitamins-Minerals (MULTIVITAMIN PO) Take 1 tablet by mouth daily.    . Omega 3-6-9 Fatty Acids (OMEGA-3 FUSION) LIQD Take by mouth. Takes 1 tsp. daily    . progesterone (PROMETRIUM) 200 MG capsule Take 1 capsule (200 mg total) by mouth daily. 60 capsule 11   No current facility-administered medications for this visit.      ALLERGIES: Review of patient's allergies indicates no known allergies.  Family History  Problem Relation Age of Onset  . Thyroid disease Sister     hypothyroidism  . Hypertension Maternal Grandmother   . Heart disease Maternal Grandmother   . Diabetes Maternal Grandfather   . Other Maternal Grandfather     benign brain tumor  . Multiple sclerosis Father   . Dementia Paternal Grandmother     Social History   Social History  . Marital status: Married    Spouse name: N/A  . Number of children: N/A  . Years of education: N/A   Occupational History  . Not on file.   Social History Main Topics  . Smoking status: Never Smoker  . Smokeless tobacco: Never Used  . Alcohol use 2.4 oz/week  2 Glasses of wine, 2 Standard drinks or equivalent per week  . Drug use: No  . Sexual activity: Yes    Partners: Female, Female    Birth control/ protection: None   Other Topics Concern  . Not on file   Social History Narrative  . No narrative on file    ROS:  Pertinent items are noted in HPI.  PHYSICAL EXAMINATION:    BP 100/62 (BP Location: Right Arm, Patient Position: Sitting, Cuff Size: Normal)   Pulse (!) 50   Ht 5' 2.5" (1.588 m)   Wt 146 lb 9.6 oz (66.5 kg)   LMP 08/07/2015 (Approximate)   BMI 26.39 kg/m     General appearance: alert, cooperative and appears stated age   Pelvic: External genitalia:  no lesions              Urethra:  normal appearing urethra with no masses, tenderness or lesions              Bartholins and  Skenes: normal                 Vagina: normal appearing vagina with normal color and discharge, no lesions              Cervix: no lesions                Bimanual Exam:  Uterus:  normal size, contour, position, consistency, mobility, non-tender              Adnexa: no mass, fullness, tenderness           Chaperone was present for exam.  ASSESSMENT  Status post hysteroscopy  Premature menopause. Back pain.  No sign of endometritis.   PLAN  Surgical findings, procedure and pathology report reviewed.  Ok to continue with HRT in same dosages.  NSAIDs, heating pad, and rest for back pain.  Follow up for annual exam June 2018.  return for recurrent vaginal bleeding.    An After Visit Summary was printed and given to the patient.

## 2015-11-21 ENCOUNTER — Telehealth: Payer: Self-pay | Admitting: Obstetrics and Gynecology

## 2015-11-21 NOTE — Telephone Encounter (Signed)
Patient had a D&C Hysteroscopy with myosure on 10/07/2015 with Dr.Silva. Patient reports she has not had any bleeding since right after her procedure. Reports she started bleeding on 11/19/2015. Bleeding is light per patient. Patient states she is wearing a panty liner that she changes once per day. Patient is having mild cramping with lower back pain. "It feels like it used to when I would have my cycle before the procedure." Patient is not taking any medication for cramping at this time. Denies any sharp pain or heavy bleeding. Patient is currently on Estradiol .05 mg patches biweekly and Progesterone 200 mg daily. Denies missing any doses or taking any doses late. "Dr.Silva told me at my follow up visit to call if I had any more bleeding because I may need more follow up." Advised I will speak with Dr.Silva and return call with further recommendations. Patient is agreeable.

## 2015-11-21 NOTE — Telephone Encounter (Signed)
Spoke with patient. Advised of message as seen below from Nathalie. Patient is agreeable and verbalizes understanding. Appointment scheduled for 11/26/2015 at 8:15 am with Dr.Silva. Patient is agreeable to date and time.  Routing to provider for final review. Patient agreeable to disposition. Will close encounter.

## 2015-11-21 NOTE — Telephone Encounter (Signed)
Patient called and said, "Dr. Quincy Simmonds did surgery on me and everything was fine. Now I am having bleeding and cramping like I was before. I need to know if I need to come in for a visit."  Last seen: 10/24/15

## 2015-11-21 NOTE — Telephone Encounter (Signed)
Please have patient come to office next week for [place] to discuss the bleeding and options for care.  We have some other choices for HRT we could consider.

## 2015-11-21 NOTE — Telephone Encounter (Signed)
Left message to call Kaitlyn at [phone]. 

## 2015-11-26 ENCOUNTER — Ambulatory Visit (INDEPENDENT_AMBULATORY_CARE_PROVIDER_SITE_OTHER): Payer: Managed Care, Other (non HMO) | Admitting: Obstetrics and Gynecology

## 2015-11-26 ENCOUNTER — Encounter: Payer: Self-pay | Admitting: Obstetrics and Gynecology

## 2015-11-26 VITALS — BP 100/58 | HR 50 | Ht 62.5 in | Wt 146.0 lb

## 2015-11-26 DIAGNOSIS — Z79890 Hormone replacement therapy (postmenopausal): Secondary | ICD-10-CM | POA: Diagnosis not present

## 2015-11-26 DIAGNOSIS — N950 Postmenopausal bleeding: Secondary | ICD-10-CM

## 2015-11-26 NOTE — Progress Notes (Addendum)
GYNECOLOGY  VISIT   HPI: 45 y.o.   Married  Caucasian  female   G0P0000 with No LMP recorded.   here complaining of continuing vaginal bleeding since  D & C/Hysteroscopy/Myosure and wants to discuss other HRT options.    Benign endometrial polyp removed.   Using Vivelle Dot 0.05 mg twice weekly and Prometrium 200 mg daily for premature menopause.  Hormonal testing on 08/22/15 while on HRT: FSH  89.6 Estradiol  89  Feels like she had a full period cycle again that is lasting for 7 days so far.  Cramping and breast tenderness.   No HTN.  Not a smoker.  No migraines with aura.   GYNECOLOGIC HISTORY: No LMP recorded. Contraception:  Postmenopausal Menopausal hormone therapy:  Vivelle Dot, Prometrium 200mg  Last mammogram:  2016 normal per patient in [place] Last pap smear: 2016 normal per patient in [place]          OB History    Gravida Para Term Preterm AB Living   0 0 0 0 0 0   SAB TAB Ectopic Multiple Live Births   0 0 0 0           There are no active problems to display for this patient.   Past Medical History:  Diagnosis Date  . Anxiety    no meds  . Depression    no meds  . Hormone disorder   . Hypothyroidism   . Premature ovarian failure    Dx'd age 28  . Thyroid disease    hypothyroid    Past Surgical History:  Procedure Laterality Date  . DILATATION & CURETTAGE/HYSTEROSCOPY WITH MYOSURE N/A 10/07/2015   Procedure: DILATATION & CURETTAGE/HYSTEROSCOPY WITH MYOSURE;  Surgeon: Nunzio Cobbs, MD;  Location: [facility];  Service: Gynecology;  Laterality: N/A;  endometrial polyp  . WISDOM TOOTH EXTRACTION      Current Outpatient Prescriptions  Medication Sig Dispense Refill  . estradiol (VIVELLE-DOT) 0.05 MG/24HR patch Place 1 patch (0.05 mg total) onto the skin 2 (two) times a week. 8 patch 11  . levothyroxine (SYNTHROID, LEVOTHROID) 112 MCG tablet Take 1 tablet (112 mcg total) by mouth daily before breakfast. 30 tablet 11  . Multiple Vitamins-Minerals  (MULTIVITAMIN PO) Take 1 tablet by mouth daily.    . Omega 3-6-9 Fatty Acids (OMEGA-3 FUSION) LIQD Take by mouth. Takes 1 tsp. daily    . progesterone (PROMETRIUM) 200 MG capsule Take 1 capsule (200 mg total) by mouth daily. 60 capsule 11   No current facility-administered medications for this visit.      ALLERGIES: Review of patient's allergies indicates no known allergies.  Family History  Problem Relation Age of Onset  . Thyroid disease Sister     hypothyroidism  . Hypertension Maternal Grandmother   . Heart disease Maternal Grandmother   . Diabetes Maternal Grandfather   . Other Maternal Grandfather     benign brain tumor  . Multiple sclerosis Father   . Dementia Paternal Grandmother     Social History   Social History  . Marital status: Married    Spouse name: N/A  . Number of children: N/A  . Years of education: N/A   Occupational History  . Not on file.   Social History Main Topics  . Smoking status: Never Smoker  . Smokeless tobacco: Never Used  . Alcohol use 2.4 oz/week    2 Glasses of wine, 2 Standard drinks or equivalent per week  . Drug use: No  .  Sexual activity: Yes    Partners: Female, Female    Birth control/ protection: None   Other Topics Concern  . Not on file   Social History Narrative  . No narrative on file    ROS:  Pertinent items are noted in HPI.  PHYSICAL EXAMINATION:    BP (!) 100/58 (BP Location: Right Arm, Patient Position: Sitting, Cuff Size: Normal)   Pulse (!) 50   Ht 5' 2.5" (1.588 m)   Wt 146 lb (66.2 kg)   BMI 26.28 kg/m     General appearance: alert, cooperative and appears stated age    Pelvic: External genitalia:  no lesions              Urethra:  normal appearing urethra with no masses, tenderness or lesions              Bartholins and Skenes: normal                 Vagina: normal appearing vagina with normal color and discharge, no lesions              Cervix: no lesions.  Mild bleeding noted.                 Bimanual Exam:  Uterus:  normal size, contour, position, consistency, mobility, non-tender              Adnexa: no mass, fullness, tenderness           Chaperone was present for exam.  ASSESSMENT  Postmenopausal bleeding.  Status post hysteroscopic polypectomy, dilation and curettage.  HRT patient.  Premature menopause.   PLAN  Discussion of recurrent postmenopausal bleeding.  Options for care discussed including Mirena IUD with continued transdermal estrogen, new oral progesterone such as Provera 5 mg daily and continued transdermal estrogen, LoLoestrin, hysteroscopy, and hysterectomy for recurrent bleeding.  We had a detailed discussion of Mirena IUD.  Risks and benefits reviewed in detail.  Brochure given.   Would plan for UPT day of procedure, Cytotec 200 mcg the night before and the am of the procedure, and paracervical block.  Patient appreciative of discussion.  Patient will call back to office in 5 days with her choice of treatment.   An After Visit Summary was printed and given to the patient.  _25_____ minutes face to face time of which over 50% was spent in counseling.

## 2015-12-08 ENCOUNTER — Telehealth: Payer: Self-pay | Admitting: Obstetrics and Gynecology

## 2015-12-08 NOTE — Telephone Encounter (Signed)
Please contact patient to see if she made a decision regarding a Mirena progesterone IUD for the progesterone part of her HRT.  We talked about potentially making this change and continuing her transdermal estrogen.

## 2015-12-09 NOTE — Telephone Encounter (Signed)
Left message to call Kaitlyn at [phone]. 

## 2015-12-11 MED ORDER — MEDROXYPROGESTERONE ACETATE 5 MG PO TABS: 5.0000 mg | ORAL_TABLET | Freq: Every day | ORAL | 11 refills | Status: DC

## 2015-12-11 NOTE — Telephone Encounter (Signed)
Spoke with patient. Advised of message as seen below from [place]. Patient is agreeable. Rx for Provera 5 mg daily #30 11RF sent to pharmacy on file.  Routing to provider for final review. Patient agreeable to disposition. Will close encounter.

## 2015-12-11 NOTE — Telephone Encounter (Signed)
Patient can switch to Provera 5 mg daily instead of the Prometrium 200 mg daily.  Ok to give refills until annual exam is due.   Call for further bleeding.   Thanks.

## 2015-12-11 NOTE — Telephone Encounter (Signed)
Spoke with patient. Patient states that she has decided that she would like to continue taking oral progesterone. Does not wish to have Mirena placed at this time. States that Dr.Silva mentioned switching her to a different progesterone medication if she decided to continue with oral progesterone. Advised I will speak with Dr.Silva and return call with further recommendations. Patient is agreeable.

## 2016-01-29 ENCOUNTER — Telehealth: Payer: Self-pay | Admitting: Obstetrics and Gynecology

## 2016-01-29 NOTE — Telephone Encounter (Signed)
Patient was started on a new medication and she is having some break thru bleeding.

## 2016-01-29 NOTE — Telephone Encounter (Signed)
Spoke with patient. Patient states that last week she changed her Estradiol patch 1 day late and started her menses. Reports bleeding was the flow of a normal menses and stopped on 01/24/2016. Has been taking Provera 5 mg daily and has not missed any pills. Reports she started bleeding again yesterday. Bleeding is light. Patient is very concerned. Denies any pain, fatigue, light headedness, or dizziness. Advised I will speak with Dr.Silva and return call with further recommendations. Patient is agreeable.

## 2016-01-29 NOTE — Telephone Encounter (Signed)
At her last office visit, we were talking about the Mirena IUD as her form of progesterone for her HRT.  This may stop all vaginal bleeding.  Is the patient interested in this option?

## 2016-01-30 MED ORDER — MEDROXYPROGESTERONE ACETATE 10 MG PO TABS: 10.0000 mg | ORAL_TABLET | Freq: Every day | ORAL | 6 refills | Status: DC

## 2016-01-30 NOTE — Telephone Encounter (Signed)
Left message to call Kaitlyn at [phone]. 

## 2016-01-30 NOTE — Telephone Encounter (Signed)
We can increase her Provera to 10 mg daily and have her continue on the estradiol patch 0.05 mg twice weekly.  Please keep a bleeding calendar.  If the bleeding and spotting persist, it is time for an office visit and potential sonohysterogram.

## 2016-01-30 NOTE — Telephone Encounter (Signed)
Signed rx faxed to [facility] [phone] on file with cover sheet and confirmation.  Routing to provider for final review. Patient agreeable to disposition. Will close encounter.

## 2016-01-30 NOTE — Telephone Encounter (Signed)
Spoke with patient. Patient states she has decided she does not wish to have Mirena IUD placed. Advised I will speak with Dr.Silva regarding further recommendations and return call. Patient is agreeable.

## 2016-01-30 NOTE — Telephone Encounter (Signed)
Spoke with patient. Advised of message as seen below from [place]. Patient is agreeable and verbalizes understanding. Rx for Provera 10 mg take 1 tablet daily #30 6RF until aex written. Rx to [facility] for signing before faxing. Aware if bleeding and spotting persists she will need to be seen for an OV with possible [facility].

## 2016-02-06 ENCOUNTER — Telehealth: Payer: Self-pay | Admitting: Obstetrics and Gynecology

## 2016-02-06 MED ORDER — ESTRADIOL 0.05 MG/24HR TD PTWK: 0.0500 mg | MEDICATED_PATCH | TRANSDERMAL | 7 refills | Status: DC

## 2016-02-06 NOTE — Telephone Encounter (Addendum)
Dr. Quincy Simmonds, See patient message below and advise?

## 2016-02-06 NOTE — Telephone Encounter (Signed)
Left detailed message, ok per current dpr. Advised as seen below per Dr. Quincy Simmonds. Advised patient this is a once weekly patch. Advised patient to f/u with pharmacy for coverage and return call to office if not covered for alternative or if any additional questions/concerns.

## 2016-02-06 NOTE — Telephone Encounter (Signed)
[facility] at [employer]. [phone] Patient would like to change her Estradiol prescription due to it not being covered by her new insurance.

## 2016-02-06 NOTE — Telephone Encounter (Signed)
We may need to switch her to estradiol transdermal patch 0.05 mg once weekly (NOT the kind that is twice weekly).  If this is not covered, then start oral Estrace 1 mg daily. OK for refills until next annual exam.

## 2016-03-25 ENCOUNTER — Other Ambulatory Visit: Payer: Self-pay

## 2016-03-25 MED ORDER — MEDROXYPROGESTERONE ACETATE 10 MG PO TABS: 10.0000 mg | ORAL_TABLET | Freq: Every day | ORAL | 0 refills | Status: DC

## 2016-03-25 MED ORDER — ESTRADIOL 0.05 MG/24HR TD PTWK: 0.0500 mg | MEDICATED_PATCH | TRANSDERMAL | 0 refills | Status: DC

## 2016-03-25 MED ORDER — LEVOTHYROXINE SODIUM 112 MCG PO TABS: 112.0000 ug | ORAL_TABLET | Freq: Every day | ORAL | 1 refills | Status: DC

## 2016-03-25 NOTE — Telephone Encounter (Signed)
Medication refill request: Estradiol patch Last AEX:  08/22/15 BS Next AEX: 08/26/16 Last MMG (if hormonal medication request): 2016 normal per patient in [place] Refill authorized: 02/06/16 #4 w/7 refills  Medication refill request: Levothyroxine Refill authorized: 08/22/15 #30 w/11  Medication refill request: Medroxyprogesterone  Refill authorized: 01/30/16 #30 w/6 refills;   Fax from [facility] states that they need new rx on file for each of the above medications. Please advise.

## 2016-03-25 NOTE — Telephone Encounter (Signed)
I will give her 3 month refill of the estrogen and progesterone until her mammogram is updated. She may go to the [facility] and self refer for her mammogram. Their phone number is [phone].  I will refill her synthroid until her annual exam is due.

## 2016-05-13 ENCOUNTER — Other Ambulatory Visit: Payer: Self-pay | Admitting: Obstetrics and Gynecology

## 2016-05-13 NOTE — Telephone Encounter (Signed)
Medication refill request: Estradiol  Last AEX: 08-22-15  Next AEX: 08-26-16  Last MMG (if hormonal medication request): none  Refill authorized: please advise

## 2016-05-26 ENCOUNTER — Other Ambulatory Visit: Payer: Self-pay | Admitting: Obstetrics and Gynecology

## 2016-05-26 DIAGNOSIS — Z1231 Encounter for screening mammogram for malignant neoplasm of breast: Secondary | ICD-10-CM

## 2016-05-27 ENCOUNTER — Other Ambulatory Visit: Payer: Self-pay | Admitting: *Deleted

## 2016-05-27 MED ORDER — MEDROXYPROGESTERONE ACETATE 10 MG PO TABS: 10.0000 mg | ORAL_TABLET | Freq: Every day | ORAL | 0 refills | Status: DC

## 2016-05-27 NOTE — Telephone Encounter (Signed)
Medication refill request: provera  Last AEX:  08/22/15 Next AEX: 08/26/16 Last MMG (if hormonal medication request): has appt 06/21/16  Refill authorized: 03/25/16 #90tabs/0R. Today please advise.

## 2016-06-21 ENCOUNTER — Ambulatory Visit
Admission: RE | Admit: 2016-06-21 | Discharge: 2016-06-21 | Disposition: A | Discharge status: Home / Self Care | Payer: 59 | Source: Ambulatory Visit | Attending: Obstetrics and Gynecology | Admitting: Obstetrics and Gynecology

## 2016-06-21 ENCOUNTER — Other Ambulatory Visit: Payer: Self-pay | Admitting: Obstetrics and Gynecology

## 2016-06-21 DIAGNOSIS — Z1231 Encounter for screening mammogram for malignant neoplasm of breast: Secondary | ICD-10-CM

## 2016-08-02 ENCOUNTER — Other Ambulatory Visit: Payer: Self-pay

## 2016-08-02 MED ORDER — MEDROXYPROGESTERONE ACETATE 10 MG PO TABS: 10.0000 mg | ORAL_TABLET | Freq: Every day | ORAL | 0 refills | Status: DC

## 2016-08-02 NOTE — Telephone Encounter (Signed)
Medication refill request: Medroxyprogesterone Last AEX:  08/22/15 BS Next AEX: 08/26/16 BS Last MMG (if hormonal medication request): 06/21/16 BIRADS1, Density B, [facility] Refill authorized: 05/27/16 #90 0R. Please advise. Thank you.

## 2016-08-26 ENCOUNTER — Ambulatory Visit (INDEPENDENT_AMBULATORY_CARE_PROVIDER_SITE_OTHER): Payer: 59 | Admitting: Obstetrics and Gynecology

## 2016-08-26 ENCOUNTER — Encounter: Payer: Self-pay | Admitting: Obstetrics and Gynecology

## 2016-08-26 VITALS — BP 122/70 | HR 68 | Resp 16 | Ht 63.0 in | Wt 142.0 lb

## 2016-08-26 DIAGNOSIS — E039 Hypothyroidism, unspecified: Secondary | ICD-10-CM | POA: Diagnosis not present

## 2016-08-26 DIAGNOSIS — R208 Other disturbances of skin sensation: Secondary | ICD-10-CM

## 2016-08-26 DIAGNOSIS — R200 Anesthesia of skin: Secondary | ICD-10-CM

## 2016-08-26 DIAGNOSIS — Z01419 Encounter for gynecological examination (general) (routine) without abnormal findings: Secondary | ICD-10-CM | POA: Diagnosis not present

## 2016-08-26 MED ORDER — MEDROXYPROGESTERONE ACETATE 10 MG PO TABS
10.0000 mg | ORAL_TABLET | Freq: Every day | ORAL | 3 refills | Status: DC
Start: 2016-08-26 — End: 2016-08-26

## 2016-08-26 MED ORDER — ESTRADIOL 0.05 MG/24HR TD PTTW: 1.0000 | MEDICATED_PATCH | TRANSDERMAL | 3 refills | Status: DC

## 2016-08-26 MED ORDER — MEDROXYPROGESTERONE ACETATE 10 MG PO TABS: 10.0000 mg | ORAL_TABLET | Freq: Every day | ORAL | 3 refills | Status: DC

## 2016-08-26 NOTE — Progress Notes (Signed)
46 y.o. G43P0000 Married Caucasian female here for annual exam.    Had hysteroscopy with Myosure removal of benign polyp August 2017.  No menstrual bleeding since September 2017 when she feels like she had a period. She is on HRT for premature menopause.  On Climara 0.05 mg patch weekly.  Taking Provera 10 mg daily.   The estrogen patch is not sticking well. Can get a rash from this also.  Does meditation and has left foot numbness that occurs in certain positions.  This is new onset. No other numbness or weakness in the leg/foot.  No real pain.   PCP: No PCP    No LMP recorded. Patient is postmenopausal.       Sexually active: Yes.    The current method of family planning is none.    Exercising: Yes.    cardio, strength, yoga, walking Smoker:  no  Health Maintenance: Pap:  2016  History of abnormal Pap:  no MMG:  06/21/16 BIRADS 1 negative/density b Colonoscopy:  n/a BMD:   2010  Result  Normal per [place] in [place] TDaP:  2017. Gardasil:   N/A HIV: negative/done in the past Hep C: negative done in the past per patient Screening Labs:  Discuss today   reports that she has never smoked. She has never used smokeless tobacco. She reports that she drinks about 2.4 oz of alcohol per week . She reports that she does not use drugs.  Past Medical History:  Diagnosis Date  . Anxiety    no meds  . Depression    no meds  . Hormone disorder   . Hypothyroidism   . Premature ovarian failure    Dx'd age 28  . Thyroid disease    hypothyroid    Past Surgical History:  Procedure Laterality Date  . DILATATION & CURETTAGE/HYSTEROSCOPY WITH MYOSURE N/A 10/07/2015   Procedure: DILATATION & CURETTAGE/HYSTEROSCOPY WITH MYOSURE;  Surgeon: Nunzio Cobbs, MD;  Location: [facility];  Service: Gynecology;  Laterality: N/A;  endometrial polyp  . WISDOM TOOTH EXTRACTION      Current Outpatient Prescriptions  Medication Sig Dispense Refill  . estradiol (CLIMARA - DOSED IN MG/24 HR) 0.05  mg/24hr patch APPLY 1 PATCH ONTO THE SKIN ONCE WEEKLY 12 patch 1  . levothyroxine (SYNTHROID, LEVOTHROID) 112 MCG tablet Take 1 tablet (112 mcg total) by mouth daily before breakfast. 90 tablet 1  . medroxyPROGESTERone (PROVERA) 10 MG tablet Take 1 tablet (10 mg total) by mouth daily. 90 tablet 0  . Multiple Vitamins-Minerals (MULTIVITAMIN PO) Take 1 tablet by mouth daily.    . Omega 3-6-9 Fatty Acids (OMEGA-3 FUSION) LIQD Take by mouth. Takes 1 tsp. daily     No current facility-administered medications for this visit.     Family History  Problem Relation Age of Onset  . Thyroid disease Sister        hypothyroidism  . Hypertension Maternal Grandmother   . Heart disease Maternal Grandmother   . Diabetes Maternal Grandfather   . Other Maternal Grandfather        benign brain tumor  . Multiple sclerosis Father   . Dementia Paternal Grandmother     ROS:  Pertinent items are noted in HPI.  Otherwise, a comprehensive ROS was negative.  Exam:   BP 122/70 (BP Location: Right Arm, Patient Position: Sitting, Cuff Size: Normal)   Pulse 68   Resp 16   Ht 5\' 3"  (1.6 m)   Wt 142 lb (64.4 kg)  BMI 25.15 kg/m     General appearance: alert, cooperative and appears stated age Head: Normocephalic, without obvious abnormality, atraumatic Neck: no adenopathy, supple, symmetrical, trachea midline and thyroid normal to inspection and palpation Lungs: clear to auscultation bilaterally Breasts: normal appearance, no masses or tenderness, No nipple retraction or dimpling, No nipple discharge or bleeding, No axillary or supraclavicular adenopathy Heart: regular rate and rhythm Abdomen: soft, non-tender; no masses, no organomegaly Extremities: extremities normal, atraumatic, no cyanosis or edema Skin: Skin color, texture, turgor normal. No rashes or lesions Lymph nodes: Cervical, supraclavicular, and axillary nodes normal. No abnormal inguinal nodes palpated Neurologic: Grossly normal  Pelvic:  External genitalia:  no lesions              Urethra:  normal appearing urethra with no masses, tenderness or lesions              Bartholins and Skenes: normal                 Vagina: normal appearing vagina with normal color and discharge, no lesions              Cervix: no lesions              Pap taken: No. Bimanual Exam:  Uterus:  normal size, contour, position, consistency, mobility, non-tender              Adnexa: no mass, fullness, tenderness              Rectal exam: Yes.  .  Confirms.              Anus:  normal sphincter tone, no lesions  Chaperone was present for exam.  Assessment:   Well woman visit with normal exam. Premature menopause.  On HRT.  Left foot numbness.  Positional and not spontaneous. Hypothyroidism. On Synthroid.   Plan: Mammogram screening discussed. Recommended self breast awareness. Pap and HR HPV as above. Guidelines for Calcium, Vitamin D, regular exercise program including cardiovascular and weight bearing exercise. Refill of HRT.  Will switch back to Vivelle Dot 0.05 mg twice weekly.  Provera 10 mg daily.  Return for fasting labs. Will needs refill of Synthroid after TFTs are back.  Referral to Dr. Lynann Bologna. Follow up annually and prn.   After visit summary provided.

## 2016-08-26 NOTE — Patient Instructions (Signed)

## 2016-09-03 ENCOUNTER — Other Ambulatory Visit: Payer: Self-pay | Admitting: Obstetrics and Gynecology

## 2016-09-06 ENCOUNTER — Other Ambulatory Visit (INDEPENDENT_AMBULATORY_CARE_PROVIDER_SITE_OTHER): Payer: 59

## 2016-09-06 ENCOUNTER — Other Ambulatory Visit: Payer: Self-pay | Admitting: Obstetrics and Gynecology

## 2016-09-06 DIAGNOSIS — E039 Hypothyroidism, unspecified: Secondary | ICD-10-CM

## 2016-09-06 DIAGNOSIS — Z01419 Encounter for gynecological examination (general) (routine) without abnormal findings: Secondary | ICD-10-CM

## 2016-09-06 NOTE — Telephone Encounter (Signed)
eScribe request from [facility] for refill on LEVOTHYROXINE Last filled - 03/25/16, #90 X 1 REFILL Last AEX - 08/26/16 Next AEX - 08/29/17 Patient was seen this morning for lab appointment. Refill Authorized - 90 x 3?

## 2016-09-07 LAB — COMPREHENSIVE METABOLIC PANEL
ALT: 19 IU/L (ref 0–32)
AST: 26 IU/L (ref 0–40)
Albumin/Globulin Ratio: 2.3 — ABNORMAL HIGH (ref 1.2–2.2)
Albumin: 4.1 g/dL (ref 3.5–5.5)
Alkaline Phosphatase: 40 IU/L (ref 39–117)
BUN/Creatinine Ratio: 13 (ref 9–23)
BUN: 12 mg/dL (ref 6–24)
Bilirubin Total: 0.5 mg/dL (ref 0.0–1.2)
CO2: 24 mmol/L (ref 20–29)
Calcium: 8.7 mg/dL (ref 8.7–10.2)
Chloride: 103 mmol/L (ref 96–106)
Creatinine, Ser: 0.89 mg/dL (ref 0.57–1.00)
GFR calc Af Amer: 91 mL/min/{1.73_m2} (ref >59)
GFR calc non Af Amer: 79 mL/min/{1.73_m2} (ref >59)
Globulin, Total: 1.8 g/dL (ref 1.5–4.5)
Glucose: 90 mg/dL (ref 65–99)
Potassium: 3.9 mmol/L (ref 3.5–5.2)
Sodium: 142 mmol/L (ref 134–144)
Total Protein: 5.9 g/dL — ABNORMAL LOW (ref 6.0–8.5)

## 2016-09-07 LAB — CBC
Hematocrit: 37.9 % (ref 34.0–46.6)
Hemoglobin: 13 g/dL (ref 11.1–15.9)
MCH: 31.3 pg (ref 26.6–33.0)
MCHC: 34.3 g/dL (ref 31.5–35.7)
MCV: 91 fL (ref 79–97)
Platelets: 209 10*3/uL (ref 150–379)
RBC: 4.16 x10E6/uL (ref 3.77–5.28)
RDW: 13.2 % (ref 12.3–15.4)
WBC: 7.2 10*3/uL (ref 3.4–10.8)

## 2016-09-07 LAB — THYROID PANEL WITH TSH
Free Thyroxine Index: 1.9 (ref 1.2–4.9)
T3 Uptake Ratio: 29 % (ref 24–39)
T4, Total: 6.6 ug/dL (ref 4.5–12.0)
TSH: 1.9 u[IU]/mL (ref 0.450–4.500)

## 2016-09-07 LAB — LIPID PANEL W/O CHOL/HDL RATIO
Cholesterol, Total: 137 mg/dL (ref 100–199)
HDL: 55 mg/dL (ref >39)
LDL Calculated: 74 mg/dL (ref 0–99)
Triglycerides: 40 mg/dL (ref 0–149)
VLDL Cholesterol Cal: 8 mg/dL (ref 5–40)

## 2017-01-25 ENCOUNTER — Telehealth: Payer: Self-pay | Admitting: Obstetrics and Gynecology

## 2017-01-25 NOTE — Telephone Encounter (Signed)
Lauren from [facility] would like to speak with nurse about pt's prescription. Reference number is [id number]

## 2017-01-25 NOTE — Telephone Encounter (Signed)
Spoke with pharmacist Sharyn Lull at [place].   Generic for Levothroid 112 mcg out of stock, request to fill as generic for Synthroid 112 mcg #90/2RF. Levothyroxine is same generic for both, different manufacture.   Advised may fill as levothyroxine 112 mcg.   Routing to provider for final review. Patient is agreeable to disposition. Will close encounter.

## 2017-04-05 ENCOUNTER — Other Ambulatory Visit: Payer: Self-pay | Admitting: Obstetrics and Gynecology

## 2017-04-05 NOTE — Telephone Encounter (Signed)
Patient is asking for refills of vivelle dot and medroxyprogesterone. Patient is asking for a 90 day supply.  [facility].

## 2017-04-05 NOTE — Telephone Encounter (Signed)
Medication refill request: Vivelle-Dot and Provera Last AEX:  08/26/16 BS Next AEX: 08/29/17 Last MMG (if hormonal medication request): 06/21/16 BIRADS 1 negative/density b Refill authorized: please advise

## 2017-04-06 MED ORDER — ESTRADIOL 0.05 MG/24HR TD PTTW: 1.0000 | MEDICATED_PATCH | TRANSDERMAL | 1 refills | Status: DC

## 2017-04-06 MED ORDER — MEDROXYPROGESTERONE ACETATE 10 MG PO TABS: 10.0000 mg | ORAL_TABLET | Freq: Every day | ORAL | 1 refills | Status: DC

## 2017-04-08 ENCOUNTER — Other Ambulatory Visit: Payer: Self-pay | Admitting: Obstetrics and Gynecology

## 2017-04-08 NOTE — Telephone Encounter (Signed)
Patient would like a prescription for provera sent to [facility] on [address] for a 3 month supply. Pharmacy number is [phone].

## 2017-04-08 NOTE — Telephone Encounter (Signed)
Called patient and per DPR, left message stating Rxs have been sent to her pharmacy.

## 2017-05-13 ENCOUNTER — Telehealth: Payer: Self-pay | Admitting: Obstetrics and Gynecology

## 2017-05-13 ENCOUNTER — Other Ambulatory Visit: Payer: Self-pay | Admitting: Obstetrics and Gynecology

## 2017-05-13 MED ORDER — LEVOTHYROXINE SODIUM 112 MCG PO TABS: 112.0000 ug | ORAL_TABLET | Freq: Every day | ORAL | 1 refills | Status: DC

## 2017-05-13 NOTE — Telephone Encounter (Signed)
Refill has been sent to pharmacy. Okay to send per Dr. Quincy Simmonds.

## 2017-05-13 NOTE — Telephone Encounter (Signed)
Patient wants to switch to [facility] at [facility] for her 90 day supply for Levothyroxine.

## 2017-05-13 NOTE — Telephone Encounter (Signed)
Medication refill request: Synthroid  Last AEX:  08/26/16 BS  Next AEX: 08/29/17  Last MMG (if hormonal medication request): 06/21/16 BIRADS 1 negative/density b Refill authorized: 09/07/16 #90, 3RF. Today, please advise.

## 2017-05-13 NOTE — Telephone Encounter (Signed)
Ok to refill Synthroid 90 day supply at new pharmacy until annual exam is due.

## 2017-06-24 ENCOUNTER — Other Ambulatory Visit (HOSPITAL_COMMUNITY)
Admission: RE | Admit: 2017-06-24 | Discharge: 2017-06-24 | Disposition: A | Discharge status: Home / Self Care | Payer: 59 | Source: Ambulatory Visit | Attending: Obstetrics and Gynecology | Admitting: Obstetrics and Gynecology

## 2017-06-24 ENCOUNTER — Telehealth: Payer: Self-pay | Admitting: Obstetrics and Gynecology

## 2017-06-24 ENCOUNTER — Ambulatory Visit: Payer: 59 | Admitting: Obstetrics and Gynecology

## 2017-06-24 ENCOUNTER — Encounter: Payer: Self-pay | Admitting: Obstetrics and Gynecology

## 2017-06-24 ENCOUNTER — Other Ambulatory Visit: Payer: Self-pay

## 2017-06-24 VITALS — BP 120/68 | HR 76 | Resp 14 | Ht 63.0 in | Wt 156.0 lb

## 2017-06-24 DIAGNOSIS — Z01812 Encounter for preprocedural laboratory examination: Secondary | ICD-10-CM

## 2017-06-24 DIAGNOSIS — Z124 Encounter for screening for malignant neoplasm of cervix: Secondary | ICD-10-CM | POA: Insufficient documentation

## 2017-06-24 DIAGNOSIS — N950 Postmenopausal bleeding: Secondary | ICD-10-CM

## 2017-06-24 LAB — POCT URINE PREGNANCY: Preg Test, Ur: NEGATIVE

## 2017-06-24 NOTE — Telephone Encounter (Signed)
Appt 3:30 today with me.

## 2017-06-24 NOTE — Progress Notes (Signed)
Opened in error

## 2017-06-24 NOTE — Telephone Encounter (Signed)
Spoke with patient, OV scheduled for today at 3:30pm with Dr. Quincy Simmonds. Patient verbalizes understanding and is agreeable. Will close encounter.

## 2017-06-24 NOTE — Patient Instructions (Signed)

## 2017-06-24 NOTE — Telephone Encounter (Signed)
Spoke with patient. Postmenopausal. Reports dark brown spotting on 4/24, has changed to red, no changes in flow. Denies pain or any other GYN symptoms. On estradiol patch and provera. Started new medication, Duexis, last week prescribed by podiatrist.   Experienced PMB in 2017, followed with D&C. Benign endometrial poly removed. Last PUS 04/07/15.  Advised patient will review Dr. Quincy Simmonds and return call to schedule. Patient agreeable.  Dr. Quincy Simmonds - please advise?

## 2017-06-24 NOTE — Telephone Encounter (Signed)
Patient is having post menopausal spotting. Last seen 08/26/16.

## 2017-06-24 NOTE — Progress Notes (Signed)
GYNECOLOGY  VISIT   HPI: 47 y.o.   Married  Caucasian  female   G0P0000 with Patient's last menstrual period was 08/07/2015 (approximate).   here for   Postmenopausal bleeding.  Bleed starting 2 nights ago but has not stopped.  No cramping.  No breast tenderness.  Hx premature menopause.  On Vivelle Dot and Provera 10 mg daily.   Menands 89.6 08/22/15. Has post menopausal bleeding and had hysteroscopic polypectomy of benign polyp at that time.   No missed doses of her medication.  No problems with estrogen patch application.   No change in partner.  No intercourse prior to bleeding.  No vaginal instrumenatation.  GYNECOLOGIC HISTORY: Patient's last menstrual period was 08/07/2015 (approximate). Contraception:  Postmenopausal  Menopausal hormone therapy:  Estradiol and provera Last mammogram:  06/21/16 BIRADS 1 negative  Last pap smear:   2016         OB History    Gravida  0   Para  0   Term  0   Preterm  0   AB  0   Living  0     SAB  0   TAB  0   Ectopic  0   Multiple  0   Live Births                 There are no active problems to display for this patient.   Past Medical History:  Diagnosis Date  . Anxiety    no meds  . Depression    no meds  . Hormone disorder   . Hypothyroidism   . Premature ovarian failure    Dx'd age 51  . Thyroid disease    hypothyroid    Past Surgical History:  Procedure Laterality Date  . DILATATION & CURETTAGE/HYSTEROSCOPY WITH MYOSURE N/A 10/07/2015   Procedure: DILATATION & CURETTAGE/HYSTEROSCOPY WITH MYOSURE;  Surgeon: Nunzio Cobbs, MD;  Location: [facility];  Service: Gynecology;  Laterality: N/A;  endometrial polyp  . WISDOM TOOTH EXTRACTION      Current Outpatient Medications  Medication Sig Dispense Refill  . estradiol (VIVELLE-DOT) 0.05 MG/24HR patch Place 1 patch (0.05 mg total) onto the skin 2 (two) times a week. 24 patch 1  . levothyroxine (SYNTHROID, LEVOTHROID) 112 MCG tablet Take 1 tablet  (112 mcg total) by mouth daily before breakfast. 90 tablet 1  . medroxyPROGESTERone (PROVERA) 10 MG tablet Take 1 tablet (10 mg total) by mouth daily. 90 tablet 1  . Multiple Vitamins-Minerals (MULTIVITAMIN PO) Take 1 tablet by mouth daily.    . Omega 3-6-9 Fatty Acids (OMEGA-3 FUSION) LIQD Take by mouth. Takes 1 tsp. daily     No current facility-administered medications for this visit.      ALLERGIES: Patient has no known allergies.  Family History  Problem Relation Age of Onset  . Thyroid disease Sister        hypothyroidism  . Hypertension Maternal Grandmother   . Heart disease Maternal Grandmother   . Diabetes Maternal Grandfather   . Other Maternal Grandfather        benign brain tumor  . Multiple sclerosis Father   . Dementia Paternal Grandmother     Social History   Socioeconomic History  . Marital status: Married    Spouse name: Not on file  . Number of children: Not on file  . Years of education: Not on file  . Highest education level: Not on file  Occupational History  . Not on file  Social Needs  . Financial resource strain: Not on file  . Food insecurity:    Worry: Not on file    Inability: Not on file  . Transportation needs:    Medical: Not on file    Non-medical: Not on file  Tobacco Use  . Smoking status: Never Smoker  . Smokeless tobacco: Never Used  Substance and Sexual Activity  . Alcohol use: Yes    Alcohol/week: 2.4 oz    Types: 2 Glasses of wine, 2 Standard drinks or equivalent per week  . Drug use: No  . Sexual activity: Yes    Partners: Female, Female    Birth control/protection: None  Lifestyle  . Physical activity:    Days per week: Not on file    Minutes per session: Not on file  . Stress: Not on file  Relationships  . Social connections:    Talks on phone: Not on file    Gets together: Not on file    Attends religious service: Not on file    Active member of club or organization: Not on file    Attends meetings of clubs or  organizations: Not on file    Relationship status: Not on file  . Intimate partner violence:    Fear of current or ex partner: Not on file    Emotionally abused: Not on file    Physically abused: Not on file    Forced sexual activity: Not on file  Other Topics Concern  . Not on file  Social History Narrative  . Not on file    ROS:  Pertinent items are noted in HPI.  PHYSICAL EXAMINATION:    BP 120/68 (BP Location: Right Arm, Patient Position: Sitting, Cuff Size: Normal)   Pulse 76   Resp 14   Ht 5\' 3"  (1.6 m)   Wt 156 lb (70.8 kg)   LMP 08/07/2015 (Approximate)   BMI 27.63 kg/m     General appearance: alert, cooperative and appears stated age  Pelvic: External genitalia:  no lesions              Urethra:  normal appearing urethra with no masses, tenderness or lesions              Bartholins and Skenes: normal                 Vagina: normal appearing vagina with normal color and discharge, no lesions              Cervix: no lesions.  Small amount of bloody mucous noted.                Bimanual Exam:  Uterus:  normal size, contour, position, consistency, mobility, non-tender              Adnexa: no mass, fullness, tenderness    Chaperone was present for exam.  ASSESSMENT  Premature menopause.  Post menopausal bleeding on HRT.  Hx endometrial polyp. Cervical cancer screening.   PLAN  FSH and E2.  Pap collected.  Will do pelvic US/SIS/EMB if labs confirm menopause.    An After Visit Summary was printed and given to the patient.  __15____ minutes face to face time of which over 50% was spent in counseling.

## 2017-06-25 LAB — ESTRADIOL: Estradiol: 68 pg/mL

## 2017-06-25 LAB — FOLLICLE STIMULATING HORMONE: FSH: 57.5 m[IU]/mL

## 2017-06-27 ENCOUNTER — Telehealth: Payer: Self-pay

## 2017-06-27 NOTE — Telephone Encounter (Signed)
-----   Message from Nunzio Cobbs, MD sent at 06/27/2017 12:04 PM EDT ----- Please contact the patient about her results.  Her Hamilton indicates menopause. Her estrogen is normal which reflects her estrogen therapy.   I am recommending she proceed with the sonohysterogram and endometrial biopsy to evaluate postmenopausal bleeding. Please schedule this.  I will place these orders.  She knows to expect this potential appointment.

## 2017-06-27 NOTE — Telephone Encounter (Signed)
Spoke with patient. Advised as seen below per Dr. Quincy Simmonds. SHGM with EMB scheduled for 07/14/17 at 9am, consult at 9:30am with Dr. Quincy Simmonds. Patient declined earlier appt, will be put of town. Advised to take Motrin 800 mg with food and water one hour before procedure. Patient aware will be called with benefits prior to appt.   Routing to provider for final review. Patient is agreeable to disposition. Will close encounter.  Cc: Lerry Liner, Magdalene Patricia

## 2017-06-27 NOTE — Telephone Encounter (Signed)
Left message to call Michaela Roman at [phone]. 

## 2017-06-27 NOTE — Addendum Note (Signed)
Addended by: Yisroel Ramming, Dietrich Pates E on: 06/27/2017 12:04 PM   Modules accepted: Orders

## 2017-06-29 LAB — CYTOLOGY - PAP
Diagnosis: NEGATIVE
HPV: NOT DETECTED

## 2017-07-05 ENCOUNTER — Telehealth: Payer: Self-pay | Admitting: Obstetrics and Gynecology

## 2017-07-05 NOTE — Telephone Encounter (Signed)
Patient returned call. Spoke with patient regarding benefit for sonohysterogram and endometrial biopsy. Patient understood information presented. Patient scheduled 07/14/17 with Dr Quincy Simmonds. Patient aware of appointment date, arrival time and  cancellation policy. No further questions. Ok to close

## 2017-07-05 NOTE — Telephone Encounter (Signed)
Call placed to patient to review benefits for appointment scheduled on 07/14/17. Left voicemail message requesting a return call

## 2017-07-14 ENCOUNTER — Other Ambulatory Visit: Payer: Self-pay

## 2017-07-14 ENCOUNTER — Other Ambulatory Visit: Payer: Self-pay | Admitting: Obstetrics and Gynecology

## 2017-07-14 ENCOUNTER — Encounter: Payer: Self-pay | Admitting: Obstetrics and Gynecology

## 2017-07-14 ENCOUNTER — Ambulatory Visit (INDEPENDENT_AMBULATORY_CARE_PROVIDER_SITE_OTHER): Payer: 59

## 2017-07-14 ENCOUNTER — Ambulatory Visit (INDEPENDENT_AMBULATORY_CARE_PROVIDER_SITE_OTHER): Payer: 59 | Admitting: Obstetrics and Gynecology

## 2017-07-14 DIAGNOSIS — N950 Postmenopausal bleeding: Secondary | ICD-10-CM

## 2017-07-14 NOTE — Progress Notes (Signed)
GYNECOLOGY  VISIT   HPI: 47 y.o.   Married  Caucasian  female   G0P0000 with Patient's last menstrual period was 08/07/2015 (approximate).   here for  sonohysterogram and endometrial biopsy.  No further bleeding.   Had postmenopausal bleeding 06/22/17.  On HRT.    FSH 57.5 and E2 68.   Hx endometrial polyp.  GYNECOLOGIC HISTORY: Patient's last menstrual period was 08/07/2015 (approximate). Contraception:  Postmenopausal  Menopausal hormone therapy:  vivelle-dot & provera Last mammogram:  06/21/16 BIRADS 1 negative  Last pap smear:   06/24/17 negative, HR HPV negative         OB History    Gravida  0   Para  0   Term  0   Preterm  0   AB  0   Living  0     SAB  0   TAB  0   Ectopic  0   Multiple  0   Live Births                 There are no active problems to display for this patient.   Past Medical History:  Diagnosis Date  . Anxiety    no meds  . Depression    no meds  . Hormone disorder   . Hypothyroidism   . Premature ovarian failure    Dx'd age 70  . Thyroid disease    hypothyroid    Past Surgical History:  Procedure Laterality Date  . DILATATION & CURETTAGE/HYSTEROSCOPY WITH MYOSURE N/A 10/07/2015   Procedure: DILATATION & CURETTAGE/HYSTEROSCOPY WITH MYOSURE;  Surgeon: Nunzio Cobbs, MD;  Location: [facility];  Service: Gynecology;  Laterality: N/A;  endometrial polyp  . WISDOM TOOTH EXTRACTION      Current Outpatient Medications  Medication Sig Dispense Refill  . estradiol (VIVELLE-DOT) 0.05 MG/24HR patch Place 1 patch (0.05 mg total) onto the skin 2 (two) times a week. 24 patch 1  . levothyroxine (SYNTHROID, LEVOTHROID) 112 MCG tablet Take 1 tablet (112 mcg total) by mouth daily before breakfast. 90 tablet 1  . medroxyPROGESTERone (PROVERA) 10 MG tablet Take 1 tablet (10 mg total) by mouth daily. 90 tablet 1  . Multiple Vitamins-Minerals (MULTIVITAMIN PO) Take 1 tablet by mouth daily.    . Omega 3-6-9 Fatty Acids  (OMEGA-3 FUSION) LIQD Take by mouth. Takes 1 tsp. daily     No current facility-administered medications for this visit.      ALLERGIES: Patient has no known allergies.  Family History  Problem Relation Age of Onset  . Thyroid disease Sister        hypothyroidism  . Hypertension Maternal Grandmother   . Heart disease Maternal Grandmother   . Diabetes Maternal Grandfather   . Other Maternal Grandfather        benign brain tumor  . Multiple sclerosis Father   . Dementia Paternal Grandmother     Social History   Socioeconomic History  . Marital status: Married    Spouse name: Not on file  . Number of children: Not on file  . Years of education: Not on file  . Highest education level: Not on file  Occupational History  . Not on file  Social Needs  . Financial resource strain: Not on file  . Food insecurity:    Worry: Not on file    Inability: Not on file  . Transportation needs:    Medical: Not on file    Non-medical: Not on  file  Tobacco Use  . Smoking status: Never Smoker  . Smokeless tobacco: Never Used  Substance and Sexual Activity  . Alcohol use: Yes    Alcohol/week: 2.4 oz    Types: 2 Glasses of wine, 2 Standard drinks or equivalent per week  . Drug use: No  . Sexual activity: Yes    Partners: Female, Female    Birth control/protection: None  Lifestyle  . Physical activity:    Days per week: Not on file    Minutes per session: Not on file  . Stress: Not on file  Relationships  . Social connections:    Talks on phone: Not on file    Gets together: Not on file    Attends religious service: Not on file    Active member of club or organization: Not on file    Attends meetings of clubs or organizations: Not on file    Relationship status: Not on file  . Intimate partner violence:    Fear of current or ex partner: Not on file    Emotionally abused: Not on file    Physically abused: Not on file    Forced sexual activity: Not on file  Other Topics Concern    . Not on file  Social History Narrative  . Not on file    Review of Systems  Constitutional: Negative.   HENT: Negative.   Eyes: Negative.   Respiratory: Negative.   Cardiovascular: Negative.   Gastrointestinal: Negative.   Endocrine: Negative.   Genitourinary: Negative.   Musculoskeletal: Negative.   Skin: Negative.   Allergic/Immunologic: Negative.   Neurological: Negative.   Hematological: Negative.   Psychiatric/Behavioral: Negative.     PHYSICAL EXAMINATION:    BP 104/70 (BP Location: Right Arm, Patient Position: Sitting, Cuff Size: Normal)   Pulse 64   Resp 12   Ht 5\' 3"  (1.6 m)   Wt 149 lb (67.6 kg)   LMP 08/07/2015 (Approximate)   BMI 26.39 kg/m     General appearance: alert, cooperative and appears stated age   Pelvic US Normal uterus with no masses. EMS 2.85 mm. Normal ovaries.  No free fluid.   Sonohysterogram and EBM Consent for procedures.  Sterile sonohysterogramaphy performed after Hibiclens prep.  Cannula placed without difficulty.  NS placed inside endometrial cavity.  No filling defects noted.   EMB performed under sterile conditions with Hibiclens reprep.  paracervial block 10 cc 1% Lidocaine, lot [id number], exp 1/23. Tenaculum to ant cervical lip.  Pipelle passed twice to 8 cm.  Tissue to pathology.  No complications.  Minimal EBL.   Chaperone was present for exam.  ASSESSMENT  Postmenopausal bleeding on HRT.  Premature menopause. Hx endometrial polyp.  No evidence of polyp today.   PLAN  FU EMB. Instructions/precautions given. We discussed potential change in her HRT - lowering her Provera or using Mirena IUD instead.  We reviewed risks and benefits of Mirena.  Brochure given.  FU prn.    An After Visit Summary was printed and given to the patient.  ___15___ minutes face to face time of which over 50% was spent in counseling.

## 2017-07-14 NOTE — Patient Instructions (Signed)

## 2017-07-15 ENCOUNTER — Other Ambulatory Visit: Payer: Self-pay | Admitting: Obstetrics and Gynecology

## 2017-07-15 DIAGNOSIS — Z1231 Encounter for screening mammogram for malignant neoplasm of breast: Secondary | ICD-10-CM

## 2017-07-26 ENCOUNTER — Telehealth: Payer: Self-pay | Admitting: Obstetrics and Gynecology

## 2017-07-26 DIAGNOSIS — N950 Postmenopausal bleeding: Secondary | ICD-10-CM

## 2017-07-26 NOTE — Telephone Encounter (Signed)
Patient called requesting to speak with the nurse about her appointment on 08/29/17. She is scheduled for her AEX that day but said she already had her AEX at her ultrasound visit on 07/14/17. Please confirm that she's already had her AEX this year. Patient requesting to have a Mirena placed on 08/29/17 instead.

## 2017-07-26 NOTE — Telephone Encounter (Signed)
Patient had a PUS on 07/14/2017 for PMB evaluation. Last aex was 08/26/2016. Is scheduled for next aex on 08/29/2017. Wants to use this appointment for IUD insertion instead of aex. Last pap 2016.  Routing to Dr.Silva for review as patient did not have an aex at her PUS visit.

## 2017-07-26 NOTE — Telephone Encounter (Signed)
I recommend doing the annual exam and pap first, and then scheduling the IUD insertion.

## 2017-07-27 NOTE — Telephone Encounter (Signed)
Spoke with patient. Patient states that she had a pap in April. Per review of records again patient did have a pap on 06/24/2017 which was normal with negative HPV. This was done as part of PMB evaluation. Asking if based on this she can have her IUD placed and if a separate appointment is needed. Advised will review with Dr.Silva and return call.

## 2017-07-28 NOTE — Telephone Encounter (Signed)
Ok for IUD insertion on 08/29/17.  We can do her IUD check one month later and do the annual exam at the same time. Please go ahead and move her annual exam with me to beginning of August.  Thank you.

## 2017-07-28 NOTE — Telephone Encounter (Signed)
Spoke with patient. Advised of message as seen below from [place]. Patient verbalizes understanding. Patient is post menopausal. Appointment switched from aex to IUD insertion. Patient is aware she will need a 1 month recheck in August and will do annual exam at the same time. Patient is agreeable and would like to schedule that appointment when she comes in for her IUD insertion. Order placed.  Routing to provider for final review. Patient agreeable to disposition. Will close encounter.

## 2017-08-08 ENCOUNTER — Ambulatory Visit
Admission: RE | Admit: 2017-08-08 | Discharge: 2017-08-08 | Disposition: A | Discharge status: Home / Self Care | Payer: 59 | Source: Ambulatory Visit | Attending: Obstetrics and Gynecology | Admitting: Obstetrics and Gynecology

## 2017-08-08 DIAGNOSIS — Z1231 Encounter for screening mammogram for malignant neoplasm of breast: Secondary | ICD-10-CM

## 2017-08-09 ENCOUNTER — Telehealth: Payer: Self-pay | Admitting: Obstetrics and Gynecology

## 2017-08-09 MED ORDER — MEDROXYPROGESTERONE ACETATE 5 MG PO TABS: 5.0000 mg | ORAL_TABLET | Freq: Every day | ORAL | 0 refills | Status: DC

## 2017-08-09 NOTE — Telephone Encounter (Signed)
Spoke with patient. Advised of message as seen below from [place] regarding alternative options. Patent verbalizes understanding and states that she would like to return to taking Provera daily if this is the next best option for her.  Advised will review with Dr.Silva and return call to patient.  Notes recorded by Nunzio Cobbs, MD on 07/17/2017 at 9:44 AM EDT Results to patient through My Chart.  Hi Kayna,   I am sharing good news.  Your endometrial biopsy is benign and shows atrophy of the tissue.  You do not need to make any changes in your hormone therapy if you would like to have everything continue the same.  If you wish to have the Mirena IUD placed, this has the potential to deliver the progesterone you need directly to the uterus. You would then stop the Provera. The Mirena can be associated with irregular bleeding right after placement and this may last for 4 months or so when women use it and are premenopausal.  I would expect much less in your circumstance.  Please contact the office for any questions and if you would like to proceed with the IUD.   Have a good day!  Josefa Half, MD

## 2017-08-09 NOTE — Telephone Encounter (Signed)
[place] for Provera.  We can reduce her dose to 5 mg daily.  Dispense:  30 tabs, RF none. Please schedule annual exam for July.  Thank you.

## 2017-08-09 NOTE — Telephone Encounter (Signed)
Left message to call Kaitlyn at [phone]. 

## 2017-08-09 NOTE — Telephone Encounter (Signed)
Patient wants to cancel her appointment for IUD insertion due to benefits. What other options for her?

## 2017-08-10 ENCOUNTER — Other Ambulatory Visit: Payer: Self-pay | Admitting: Obstetrics and Gynecology

## 2017-08-10 DIAGNOSIS — R928 Other abnormal and inconclusive findings on diagnostic imaging of breast: Secondary | ICD-10-CM

## 2017-08-10 NOTE — Telephone Encounter (Signed)
Spoke with patient. Advised as seen below per Dr. Quincy Simmonds. AEX scheduled for 08/29/17 at 8:30am. Patient verbalizes understanding and is agreeable. Encounter closed.

## 2017-08-11 ENCOUNTER — Telehealth: Payer: Self-pay | Admitting: Obstetrics and Gynecology

## 2017-08-11 NOTE — Telephone Encounter (Signed)
Spoke with Research officer, political party at [employer]. Advised RX for Provera 5mg  tab po daily #30/0RF sent to [facility] on 08/09/17.   Cassy advised RX not in [facility] system to transfer. Norton Pastel will f/u with [facility] to confirm Rx not on file or pended.  Verbal order given for provera as seen above to be filled at [facility].    Routing to provider, will close encounter.

## 2017-08-11 NOTE — Telephone Encounter (Signed)
Cassy  from [facility] called about a prescription the patient stated should have been sent in recently. The patient did not know the name but the pharmacist said they do not have a new prescription on file. See last telephone note for reference.

## 2017-08-12 ENCOUNTER — Ambulatory Visit
Admission: RE | Admit: 2017-08-12 | Discharge: 2017-08-12 | Disposition: A | Discharge status: Home / Self Care | Payer: 59 | Source: Ambulatory Visit | Attending: Obstetrics and Gynecology | Admitting: Obstetrics and Gynecology

## 2017-08-12 ENCOUNTER — Ambulatory Visit: Payer: 59

## 2017-08-12 DIAGNOSIS — R928 Other abnormal and inconclusive findings on diagnostic imaging of breast: Secondary | ICD-10-CM

## 2017-08-29 ENCOUNTER — Ambulatory Visit: Payer: 59 | Admitting: Obstetrics and Gynecology

## 2017-08-29 ENCOUNTER — Other Ambulatory Visit: Payer: Self-pay

## 2017-08-29 ENCOUNTER — Ambulatory Visit (INDEPENDENT_AMBULATORY_CARE_PROVIDER_SITE_OTHER): Payer: 59 | Admitting: Obstetrics and Gynecology

## 2017-08-29 ENCOUNTER — Encounter: Payer: Self-pay | Admitting: Obstetrics and Gynecology

## 2017-08-29 VITALS — BP 100/64 | HR 62 | Resp 12 | Ht 63.0 in | Wt 151.1 lb

## 2017-08-29 DIAGNOSIS — Z01419 Encounter for gynecological examination (general) (routine) without abnormal findings: Secondary | ICD-10-CM

## 2017-08-29 DIAGNOSIS — Z1211 Encounter for screening for malignant neoplasm of colon: Secondary | ICD-10-CM

## 2017-08-29 DIAGNOSIS — E2839 Other primary ovarian failure: Secondary | ICD-10-CM

## 2017-08-29 DIAGNOSIS — E288 Other ovarian dysfunction: Secondary | ICD-10-CM

## 2017-08-29 MED ORDER — ESTRADIOL 0.05 MG/24HR TD PTTW: 1.0000 | MEDICATED_PATCH | TRANSDERMAL | 3 refills | Status: DC

## 2017-08-29 MED ORDER — MEDROXYPROGESTERONE ACETATE 5 MG PO TABS: 5.0000 mg | ORAL_TABLET | Freq: Every day | ORAL | 3 refills | Status: DC

## 2017-08-29 MED ORDER — LEVOTHYROXINE SODIUM 112 MCG PO TABS: 112.0000 ug | ORAL_TABLET | Freq: Every day | ORAL | 3 refills | Status: DC

## 2017-08-29 NOTE — Patient Instructions (Signed)

## 2017-08-29 NOTE — Progress Notes (Signed)
47 y.o. G38P0000 Married Caucasian female here for annual exam.    No more bleeding.  Doing well on Provera 5 mg and Vivelle 0.05 mg.  No hot flashes.   Takes ca/vit D twice daily.   PCP:   None per patient  Patient's last menstrual period was 08/07/2015 (approximate).           Sexually active: Yes.    The current method of family planning is post menopausal status.    Exercising: Yes.    HIIT, strength training, yoga, walking Smoker:  no  Health Maintenance: Pap:  06-24-17 negative, HR HPV negative  History of abnormal Pap:  no MMG:  08-12-17 density b/BIRADS 1 negative  Colonoscopy:  n/a BMD:   2010  Result  Normal per patient in [place] TDaP:  08-22-15  Gardasil:   no HIV: done in the past, negative per patient Hep C: done in the past, negative per patient  Screening Labs:  Discuss with provider- patient is fasting today Hb today: same, Urine today: not collected   reports that she has never smoked. She has never used smokeless tobacco. She reports that she drinks about 2.4 oz of alcohol per week. She reports that she does not use drugs.  Past Medical History:  Diagnosis Date  . Anxiety    no meds  . Depression    no meds  . Hormone disorder   . Hypothyroidism   . Premature ovarian failure    Dx'd age 66  . Thyroid disease    hypothyroid    Past Surgical History:  Procedure Laterality Date  . DILATATION & CURETTAGE/HYSTEROSCOPY WITH MYOSURE N/A 10/07/2015   Procedure: DILATATION & CURETTAGE/HYSTEROSCOPY WITH MYOSURE;  Surgeon: Nunzio Cobbs, MD;  Location: [facility];  Service: Gynecology;  Laterality: N/A;  endometrial polyp  . WISDOM TOOTH EXTRACTION      Current Outpatient Medications  Medication Sig Dispense Refill  . estradiol (VIVELLE-DOT) 0.05 MG/24HR patch Place 1 patch (0.05 mg total) onto the skin 2 (two) times a week. 24 patch 1  . medroxyPROGESTERone (PROVERA) 5 MG tablet Take 1 tablet (5 mg total) by mouth daily. 30 tablet 0  . Multiple  Vitamins-Minerals (MULTIVITAMIN PO) Take 1 tablet by mouth daily.    . Omega 3-6-9 Fatty Acids (OMEGA-3 FUSION) LIQD Take by mouth. Takes 1 tsp. daily    . levothyroxine (SYNTHROID, LEVOTHROID) 112 MCG tablet Take 1 tablet (112 mcg total) by mouth daily before breakfast. 90 tablet 1   No current facility-administered medications for this visit.     Family History  Problem Relation Age of Onset  . Thyroid disease Sister        hypothyroidism  . Hypertension Maternal Grandmother   . Heart disease Maternal Grandmother   . Diabetes Maternal Grandfather   . Other Maternal Grandfather        benign brain tumor  . Multiple sclerosis Father   . Dementia Paternal Grandmother     Review of Systems  Constitutional: Negative.   HENT: Negative.   Eyes: Negative.   Respiratory: Negative.   Cardiovascular: Negative.   Gastrointestinal: Negative.   Endocrine: Negative.   Genitourinary: Negative.   Musculoskeletal: Negative.   Skin: Positive for rash.  Allergic/Immunologic: Negative.   Neurological: Negative.   Hematological: Negative.   Psychiatric/Behavioral: Negative.     Exam:   BP 100/64 (BP Location: Right Arm, Patient Position: Sitting, Cuff Size: Normal)   Pulse 62   Resp 12   Ht  5\' 3"  (1.6 m)   Wt 151 lb 1.6 oz (68.5 kg)   LMP 08/07/2015 (Approximate)   BMI 26.77 kg/m     General appearance: alert, cooperative and appears stated age Head: Normocephalic, without obvious abnormality, atraumatic Neck: no adenopathy, supple, symmetrical, trachea midline and thyroid normal to inspection and palpation Lungs: clear to auscultation bilaterally Breasts: normal appearance, no masses or tenderness, No nipple retraction or dimpling, No nipple discharge or bleeding, No axillary or supraclavicular adenopathy Heart: regular rate and rhythm Abdomen: soft, non-tender; no masses, no organomegaly Extremities: extremities normal, atraumatic, no cyanosis or edema Skin: Skin color, texture,  turgor normal. No rashes or lesions Lymph nodes: Cervical, supraclavicular, and axillary nodes normal. No abnormal inguinal nodes palpated Neurologic: Grossly normal  Pelvic: External genitalia:  no lesions              Urethra:  normal appearing urethra with no masses, tenderness or lesions              Bartholins and Skenes: normal                 Vagina: normal appearing vagina with normal color and discharge, no lesions              Cervix: no lesions              Pap taken: No. Bimanual Exam:  Uterus:  normal size, contour, position, consistency, mobility, non-tender              Adnexa: no mass, fullness, tenderness              Rectal exam: Yes.  .  Confirms.              Anus:  normal sphincter tone, no lesions  Chaperone was present for exam.  Assessment:   Well woman visit with normal exam. Premature ovarian failure - insufficiency.  HRT patient.   Plan: Mammogram screening. Recommended self breast awareness. Pap and HR HPV as above. Guidelines for Calcium, Vitamin D, regular exercise program including cardiovascular and weight bearing exercise. Continue HRT.  Discussed risks of breast cancer, MI, stroke, DVT, PE.  Adrenal cortical and 21 hydroxylase antibodies.  Routine labs. IFOB. Follow up annually and prn.    After visit summary provided.

## 2017-09-04 LAB — CBC
Hematocrit: 40.4 % (ref 34.0–46.6)
Hemoglobin: 13.7 g/dL (ref 11.1–15.9)
MCH: 31.4 pg (ref 26.6–33.0)
MCHC: 33.9 g/dL (ref 31.5–35.7)
MCV: 92 fL (ref 79–97)
Platelets: 203 10*3/uL (ref 150–450)
RBC: 4.37 x10E6/uL (ref 3.77–5.28)
RDW: 13.5 % (ref 12.3–15.4)
WBC: 5.1 10*3/uL (ref 3.4–10.8)

## 2017-09-04 LAB — COMPREHENSIVE METABOLIC PANEL
ALT: 13 IU/L (ref 0–32)
AST: 15 IU/L (ref 0–40)
Albumin/Globulin Ratio: 2.2 (ref 1.2–2.2)
Albumin: 4.4 g/dL (ref 3.5–5.5)
Alkaline Phosphatase: 43 IU/L (ref 39–117)
BUN/Creatinine Ratio: 14 (ref 9–23)
BUN: 13 mg/dL (ref 6–24)
Bilirubin Total: 0.6 mg/dL (ref 0.0–1.2)
CO2: 24 mmol/L (ref 20–29)
Calcium: 8.7 mg/dL (ref 8.7–10.2)
Chloride: 106 mmol/L (ref 96–106)
Creatinine, Ser: 0.9 mg/dL (ref 0.57–1.00)
GFR calc Af Amer: 89 mL/min/{1.73_m2} (ref >59)
GFR calc non Af Amer: 77 mL/min/{1.73_m2} (ref >59)
Globulin, Total: 2 g/dL (ref 1.5–4.5)
Glucose: 88 mg/dL (ref 65–99)
Potassium: 4 mmol/L (ref 3.5–5.2)
Sodium: 144 mmol/L (ref 134–144)
Total Protein: 6.4 g/dL (ref 6.0–8.5)

## 2017-09-04 LAB — T4, FREE: Free T4: 1.57 ng/dL (ref 0.82–1.77)

## 2017-09-04 LAB — LIPID PANEL
Chol/HDL Ratio: 2.7 ratio (ref 0.0–4.4)
Cholesterol, Total: 162 mg/dL (ref 100–199)
HDL: 59 mg/dL (ref >39)
LDL Calculated: 92 mg/dL (ref 0–99)
Triglycerides: 55 mg/dL (ref 0–149)
VLDL Cholesterol Cal: 11 mg/dL (ref 5–40)

## 2017-09-04 LAB — 21-HYDROXYLASE ANTIBODIES: 21-Hydroxylase Antibodies: <1 U/mL

## 2017-09-04 LAB — TSH: TSH: 0.763 u[IU]/mL (ref 0.450–4.500)

## 2017-09-17 LAB — SPECIMEN STATUS REPORT

## 2017-09-17 LAB — ANTIADRENAL ANTIBODIES, QUANT: Adrenal Total AutoAb: NEGATIVE

## 2017-09-30 LAB — FECAL OCCULT BLOOD, IMMUNOCHEMICAL: Fecal Occult Bld: NEGATIVE

## 2017-11-09 ENCOUNTER — Other Ambulatory Visit: Payer: Self-pay | Admitting: Obstetrics and Gynecology

## 2017-11-09 NOTE — Telephone Encounter (Signed)
Medication refill request: synthroid Last AEX:  08-29-17 BS Next AEX: 09-20-18 Last MMG (if hormonal medication request): 08-12-17 density B/BIRADS 1 negative  Refill authorized: today, please advise  Spoke with patient. Patient requests prescription at [facility] now as medication is cheaper for her there. RN advised would send request to Dr. Quincy Simmonds.   Medication pended for #90, 3RF.

## 2018-06-30 ENCOUNTER — Other Ambulatory Visit: Payer: Self-pay | Admitting: Obstetrics and Gynecology

## 2018-06-30 DIAGNOSIS — Z1231 Encounter for screening mammogram for malignant neoplasm of breast: Secondary | ICD-10-CM

## 2018-08-21 ENCOUNTER — Ambulatory Visit: Payer: 59

## 2018-08-22 ENCOUNTER — Ambulatory Visit
Admission: RE | Admit: 2018-08-22 | Discharge: 2018-08-22 | Disposition: A | Discharge status: Home / Self Care | Payer: 59 | Source: Ambulatory Visit | Attending: Obstetrics and Gynecology | Admitting: Obstetrics and Gynecology

## 2018-08-22 ENCOUNTER — Other Ambulatory Visit: Payer: Self-pay

## 2018-08-22 DIAGNOSIS — Z1231 Encounter for screening mammogram for malignant neoplasm of breast: Secondary | ICD-10-CM

## 2018-08-29 ENCOUNTER — Other Ambulatory Visit: Payer: Self-pay | Admitting: Obstetrics and Gynecology

## 2018-08-30 ENCOUNTER — Other Ambulatory Visit: Payer: Self-pay | Admitting: Obstetrics and Gynecology

## 2018-08-30 NOTE — Telephone Encounter (Signed)
[facility] calling to request refill on estradiol patch. Pharmacy number [phone].

## 2018-08-30 NOTE — Telephone Encounter (Signed)
Medication refill request: provera Last AEX:  08/29/17 Next AEX: 09/20/18 Last MMG (if hormonal medication request): 08/22/18 Bi-rads 1 neg  Refill authorized: #30  With 0 RF to last until her AEX.

## 2018-08-30 NOTE — Telephone Encounter (Signed)
Medication refill request: Vivelle Dot   Last AEX:  08/29/17 Next AEX: 09/20/18 Last MMG (if hormonal medication request):08/22/18 Bi-rads 1 neg  Refill authorized: #8 with 0 Rf

## 2018-08-31 MED ORDER — ESTRADIOL 0.05 MG/24HR TD PTTW: 1.0000 | MEDICATED_PATCH | TRANSDERMAL | 0 refills | Status: DC

## 2018-09-04 ENCOUNTER — Other Ambulatory Visit: Payer: Self-pay | Admitting: Obstetrics and Gynecology

## 2018-09-04 NOTE — Telephone Encounter (Signed)
Patient is calling regarding a refill request for estradiol 0.05MG /24 HR patch. Patient is requesting that it be sent to [facility] on [address].

## 2018-09-04 NOTE — Telephone Encounter (Signed)
Medication refill request:  Last AEX:  08-29-2017 Next AEX: 09-20-2018 Last MMG (if hormonal medication request): 07/2018 birads 1:neg Refill authorized: [facility]. Patient would like this rx sent to [facility] [address]. Please approve if appropriate.

## 2018-09-05 ENCOUNTER — Other Ambulatory Visit: Payer: Self-pay | Admitting: Sports Medicine

## 2018-09-05 ENCOUNTER — Ambulatory Visit (INDEPENDENT_AMBULATORY_CARE_PROVIDER_SITE_OTHER): Payer: 59

## 2018-09-05 ENCOUNTER — Encounter: Payer: Self-pay | Admitting: Sports Medicine

## 2018-09-05 ENCOUNTER — Other Ambulatory Visit: Payer: Self-pay

## 2018-09-05 ENCOUNTER — Ambulatory Visit: Payer: 59 | Admitting: Sports Medicine

## 2018-09-05 VITALS — BP 117/69 | HR 53 | Temp 98.1°F | Resp 16

## 2018-09-05 DIAGNOSIS — M205X1 Other deformities of toe(s) (acquired), right foot: Secondary | ICD-10-CM

## 2018-09-05 DIAGNOSIS — M2011 Hallux valgus (acquired), right foot: Secondary | ICD-10-CM | POA: Diagnosis not present

## 2018-09-05 DIAGNOSIS — M7750 Other enthesopathy of unspecified foot: Secondary | ICD-10-CM

## 2018-09-05 DIAGNOSIS — G5752 Tarsal tunnel syndrome, left lower limb: Secondary | ICD-10-CM

## 2018-09-05 DIAGNOSIS — M7752 Other enthesopathy of left foot: Secondary | ICD-10-CM

## 2018-09-05 MED ORDER — ESTRADIOL 0.05 MG/24HR TD PTTW: 1.0000 | MEDICATED_PATCH | TRANSDERMAL | 0 refills | Status: DC

## 2018-09-05 MED ORDER — METHYLPREDNISOLONE 4 MG PO TBPK: ORAL_TABLET | ORAL | 0 refills | Status: DC

## 2018-09-05 NOTE — Progress Notes (Signed)
Subjective: Michaela Roman is a 48 y.o. female patient who presents to office for evaluation of bilateral foot pain. Patient complains of progressive pain especially over the last year in both feet however over the last month the pain has flared back up states that she was seen by another podiatrist who gave her injections in the past that seemed to help a little bit but never got long-term relief.  Patient reports that she is here for a second opinion on what is going on with her feet and wants to know what she can do for more longer relief.  Patient denies any trauma or injury.  Patient reports that the pain at the instep of her left ankle started last year and a nerve shooting sharp tingling pain that was worse with certain positions and excessive walking and reports that her bunion pain has been slowly getting worse over the last years with occasional redness and pain across the toe joint especially with walking.  Patient denies any other pedal complaints at this time.  Review of Systems  Musculoskeletal: Positive for joint pain.  Neurological: Positive for tingling.  All other systems reviewed and are negative.   There are no active problems to display for this patient.   Current Outpatient Medications on File Prior to Visit  Medication Sig Dispense Refill  . [START ON 09/07/2018] estradiol (VIVELLE-DOT) 0.05 MG/24HR patch Place 1 patch (0.05 mg total) onto the skin 2 (two) times a week. 8 patch 0  . levothyroxine (SYNTHROID, LEVOTHROID) 112 MCG tablet TAKE 1 TABLET BY MOUTH ONCE DAILY BEFORE BREAKFAST 90 tablet 3  . medroxyPROGESTERone (PROVERA) 5 MG tablet Take 1 tablet by mouth once daily 30 tablet 0  . Multiple Vitamins-Minerals (MULTIVITAMIN PO) Take 1 tablet by mouth daily.     No current facility-administered medications on file prior to visit.     No Known Allergies  Objective:  General: Alert and oriented x3 in no acute distress  Dermatology: No open lesions bilateral  lower extremities, no webspace macerations, no ecchymosis bilateral, all nails x 10 are well manicured.  Vascular: Dorsalis Pedis and Posterior Tibial pedal pulses palpable, Capillary Fill Time 3 seconds,(+) pedal hair growth bilateral, no edema bilateral lower extremities, Temperature gradient within normal limits.  Neurology: Johney Maine sensation intact via light touch bilateral, no reproducible Tinel sign over the tarsal tunnel on the left foot however highly suspicious since patient is complaining of shooting pain that starts at the medial ankle.  Musculoskeletal: Mild tenderness with palpation at dorsal bunion with significant limitation of range of motion right greater than left first MPJ.  Strength within normal limits in all groups bilateral.   Gait: Antalgic gait  Xrays  Left ankle and right foot   Impression: Normal osseous mineralization there is decreased first metatarsophalangeal joint spacing right greater than left with enlarged medial and dorsal eminence right greater than left, left ankle joint within normal limits.  Soft tissue margins within normal limits.  No other acute findings.  Assessment and Plan: Problem List Items Addressed This Visit    None    Visit Diagnoses    Tendonitis of foot    -  Primary   Relevant Medications   methylPREDNISolone (MEDROL DOSEPAK) 4 MG TBPK tablet   Hav (hallux abducto valgus), right       Relevant Medications   methylPREDNISolone (MEDROL DOSEPAK) 4 MG TBPK tablet   Hallux limitus, right       Tarsal tunnel syndrome, left           -  Complete examination performed -Xrays reviewed -Discussed treatement options for likely neuritis/tarsal tunnel syndrome and tendinitis at the left medial foot and ankle with hallux limitus/bunion on right -Rx Medrol Dosepak for patient to take as instructed -Advised patient to continue with good supportive shoes and office will check orthotic benefits and if covered she may benefit long-term from orthotics  however I did advise patient that if after finishing the steroid medication if her pain continues and with getting new shoes and insoles if she is still painful then it may be a good idea to have a further testing including a MRI and a nerve study especially at the left ankle to evaluate for tarsal tunnel syndrome -Patient to return to office for orthotics or sooner if condition worsens.  Landis Martins, DPM

## 2018-09-05 NOTE — Patient Instructions (Signed)
For tennis shoes recommend:  Kandy Garrison Ascis New balance [place] Can be purchased at [employer] sports or [employer] Can be purchased at [employer] or [employer]   For work shoes recommend: [employer]  Can be purchased at a variety of places or Engineer, maintenance (IT)   For casual shoes recommend: Vionic  Can be purchased at [employer] or [employer]

## 2018-09-20 ENCOUNTER — Ambulatory Visit: Payer: 59 | Admitting: Obstetrics and Gynecology

## 2018-09-22 ENCOUNTER — Telehealth: Payer: Self-pay | Admitting: Sports Medicine

## 2018-09-22 NOTE — Telephone Encounter (Signed)
Pt returned call and is aware of the cost of orthotics and our payment plan.  She was asking about a pair of over the counter inserts that you recommended to her(Not powersteps) these you would get at the running store. She is wanting to know what she should get. I told pt I would ask you and would give her a call back probably beginning of next week with an answer.

## 2018-09-22 NOTE — Telephone Encounter (Signed)
Pt left message asking about orthotic coverage, she seen Dr Cannon Kettle a couple of weeks ago..  I returned call and left message orthotics only covered if diabetic and to call to discuss further.

## 2018-09-22 NOTE — Telephone Encounter (Signed)
Superfeet from [employer] or [employer]. She can try these instead of the powersteps. The associate at the store will help her with fitting.  Thanks Dr. Cannon Kettle

## 2018-10-03 ENCOUNTER — Other Ambulatory Visit: Payer: Self-pay | Admitting: Obstetrics and Gynecology

## 2018-10-03 NOTE — Telephone Encounter (Signed)
Medication refill request: Vivelle- dot  Last AEX:  08-29-17 BS Next AEX: 10-31-2018 Last MMG (if hormonal medication request): 08-22-2018 density B/BIRADS 1 negative  Refill authorized: Today, please advise.   Medication refill request: Provera  Refill authorized: Today, please advise.   Medications pended for 1 month supply as patient has aex on 10-31-2018.

## 2018-10-12 ENCOUNTER — Other Ambulatory Visit: Payer: Self-pay

## 2018-10-12 NOTE — Progress Notes (Signed)
48 y.o. G72P0000 Married Caucasian female here for annual exam. Patient would like to discuss issues with vaginal dryness. Sex is painful.   Noticing weight gain.   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   PCP: No PCP     Patient's last menstrual period was 08/07/2015 (approximate).           Sexually active: Yes.    The current method of family planning is post menopausal status.    Exercising: Yes.    strength training, walking, and cardio Smoker:  no  Health Maintenance: Pap: 06-24-17 Neg:Neg HR HPV History of abnormal Pap:  no MMG: 08-22-18 3D Neg/density B/BiRads1 Colonoscopy:  Patient has done stool kit 09/26/17 that was normal. BMD:   Years ago  Result  Normal per patient TDaP:  08-22-15 Gardasil:   no HIV: years ago -- negative Hep C: never Screening Labs: discuss today   reports that she has never smoked. She has never used smokeless tobacco. She reports current alcohol use of about 4.0 standard drinks of alcohol per week. She reports that she does not use drugs.  Past Medical History:  Diagnosis Date  . Anxiety    no meds  . Depression    no meds  . Hormone disorder   . Hypothyroidism   . Premature ovarian failure    Dx'd age 21  . Thyroid disease    hypothyroid    Past Surgical History:  Procedure Laterality Date  . DILATATION & CURETTAGE/HYSTEROSCOPY WITH MYOSURE N/A 10/07/2015   Procedure: DILATATION & CURETTAGE/HYSTEROSCOPY WITH MYOSURE;  Surgeon: Nunzio Cobbs, MD;  Location: [facility];  Service: Gynecology;  Laterality: N/A;  endometrial polyp  . WISDOM TOOTH EXTRACTION      Current Outpatient Medications  Medication Sig Dispense Refill  . estradiol (VIVELLE-DOT) 0.05 MG/24HR patch PLACE ONE PATCH ONTO THE SKIN TWO TIMES A WEEK 8 patch 0  . levothyroxine (SYNTHROID, LEVOTHROID) 112 MCG tablet TAKE 1 TABLET BY MOUTH ONCE DAILY BEFORE BREAKFAST 90 tablet 3  . medroxyPROGESTERone (PROVERA) 5 MG tablet Take 1 tablet by mouth once daily 30  tablet 0  . Multiple Vitamins-Minerals (MULTIVITAMIN PO) Take 1 tablet by mouth daily.     No current facility-administered medications for this visit.     Family History  Problem Relation Age of Onset  . Thyroid disease Sister        hypothyroidism  . Hypertension Maternal Grandmother   . Heart disease Maternal Grandmother   . Diabetes Maternal Grandfather   . Other Maternal Grandfather        benign brain tumor  . Multiple sclerosis Father   . Dementia Paternal Grandmother     Review of Systems  Constitutional: Negative.   HENT: Negative.   Eyes: Negative.   Respiratory: Negative.   Cardiovascular: Negative.   Gastrointestinal: Negative.   Endocrine: Negative.   Genitourinary: Negative.   Musculoskeletal: Negative.   Skin: Negative.   Allergic/Immunologic: Negative.   Neurological: Negative.   Hematological: Negative.   Psychiatric/Behavioral: Negative.     Exam:   BP 100/62 (BP Location: Left Arm, Patient Position: Sitting, Cuff Size: Normal)   Pulse 76   Temp (!) 97.4 F (36.3 C) (Temporal)   Resp 12   Ht 5' 2.75" (1.594 m)   Wt 157 lb 6.4 oz (71.4 kg)   LMP 08/07/2015 (Approximate)   BMI 28.10 kg/m     General appearance: alert, cooperative and appears stated age Head: normocephalic, without obvious abnormality,  atraumatic Neck: no adenopathy, supple, symmetrical, trachea midline and thyroid normal to inspection and palpation Lungs: clear to auscultation bilaterally Breasts: normal appearance, no masses or tenderness, No nipple retraction or dimpling, No nipple discharge or bleeding, No axillary adenopathy Heart: regular rate and rhythm Abdomen: soft, non-tender; no masses, no organomegaly Extremities: extremities normal, atraumatic, no cyanosis or edema Skin: skin color, texture, turgor normal. No rashes or lesions Lymph nodes: cervical, supraclavicular, and axillary nodes normal. Neurologic: grossly normal  Pelvic: External genitalia:  no lesions               No abnormal inguinal nodes palpated.              Urethra:  normal appearing urethra with no masses, tenderness or lesions              Bartholins and Skenes: normal                 Vagina: normal appearing vagina with normal color and discharge, no lesions              Cervix: no lesions              Pap taken: No. Bimanual Exam:  Uterus:  normal size, contour, position, consistency, mobility, non-tender              Adnexa: no mass, fullness, tenderness              Rectal exam: Yes.  .  Confirms.              Anus:  normal sphincter tone, no lesions  Chaperone was present for exam.  Assessment:   Well woman visit with normal exam. Premature ovarian failure - insufficiency.  HRT patient.  Plan: Mammogram screening discussed. Self breast awareness reviewed. Pap and HR HPV as above. Guidelines for Calcium, Vitamin D, regular exercise program including cardiovascular and weight bearing exercise. Routine labs and TFTs.  Bone density.  Refills of HRT for one year.   We discussed benefits of HRT. We also reviewed the WHI and risks of HRT noted in that study - stroke, MI, DVT, PE, and breast cancer.  Rx for Premarin cream.  Coupon given.  Follow up annually and prn.   After visit summary provided.

## 2018-10-17 ENCOUNTER — Other Ambulatory Visit: Payer: Self-pay

## 2018-10-17 ENCOUNTER — Ambulatory Visit: Payer: 59 | Admitting: Obstetrics and Gynecology

## 2018-10-17 ENCOUNTER — Encounter: Payer: Self-pay | Admitting: Obstetrics and Gynecology

## 2018-10-17 VITALS — BP 100/62 | HR 76 | Temp 97.4°F | Resp 12 | Ht 62.75 in | Wt 157.4 lb

## 2018-10-17 DIAGNOSIS — Z01419 Encounter for gynecological examination (general) (routine) without abnormal findings: Secondary | ICD-10-CM

## 2018-10-17 DIAGNOSIS — E28319 Asymptomatic premature menopause: Secondary | ICD-10-CM | POA: Diagnosis not present

## 2018-10-17 DIAGNOSIS — Z1211 Encounter for screening for malignant neoplasm of colon: Secondary | ICD-10-CM | POA: Diagnosis not present

## 2018-10-17 MED ORDER — MEDROXYPROGESTERONE ACETATE 5 MG PO TABS: 5.0000 mg | ORAL_TABLET | Freq: Every day | ORAL | 3 refills | Status: DC

## 2018-10-17 MED ORDER — ESTRADIOL 0.05 MG/24HR TD PTTW: MEDICATED_PATCH | TRANSDERMAL | 3 refills | Status: DC

## 2018-10-17 MED ORDER — PREMARIN 0.625 MG/GM VA CREA: TOPICAL_CREAM | VAGINAL | 3 refills | Status: DC

## 2018-10-17 MED ORDER — LEVOTHYROXINE SODIUM 112 MCG PO TABS: ORAL_TABLET | ORAL | 3 refills | Status: DC

## 2018-10-17 NOTE — Patient Instructions (Signed)

## 2018-10-19 LAB — VITAMIN D 25 HYDROXY (VIT D DEFICIENCY, FRACTURES): Vit D, 25-Hydroxy: 35.2 ng/mL (ref 30.0–100.0)

## 2018-10-19 LAB — COMPREHENSIVE METABOLIC PANEL
ALT: 16 IU/L (ref 0–32)
AST: 20 IU/L (ref 0–40)
Albumin/Globulin Ratio: 2.3 — ABNORMAL HIGH (ref 1.2–2.2)
Albumin: 4.5 g/dL (ref 3.8–4.8)
Alkaline Phosphatase: 41 IU/L (ref 39–117)
BUN/Creatinine Ratio: 12 (ref 9–23)
BUN: 12 mg/dL (ref 6–24)
Bilirubin Total: 0.7 mg/dL (ref 0.0–1.2)
CO2: 25 mmol/L (ref 20–29)
Calcium: 8.9 mg/dL (ref 8.7–10.2)
Chloride: 103 mmol/L (ref 96–106)
Creatinine, Ser: 0.97 mg/dL (ref 0.57–1.00)
GFR calc Af Amer: 81 mL/min/{1.73_m2} (ref >59)
GFR calc non Af Amer: 70 mL/min/{1.73_m2} (ref >59)
Globulin, Total: 2 g/dL (ref 1.5–4.5)
Glucose: 81 mg/dL (ref 65–99)
Potassium: 4.2 mmol/L (ref 3.5–5.2)
Sodium: 140 mmol/L (ref 134–144)
Total Protein: 6.5 g/dL (ref 6.0–8.5)

## 2018-10-19 LAB — CBC
Hematocrit: 40.9 % (ref 34.0–46.6)
Hemoglobin: 13.5 g/dL (ref 11.1–15.9)
MCH: 30.7 pg (ref 26.6–33.0)
MCHC: 33 g/dL (ref 31.5–35.7)
MCV: 93 fL (ref 79–97)
Platelets: 188 10*3/uL (ref 150–450)
RBC: 4.4 x10E6/uL (ref 3.77–5.28)
RDW: 13.1 % (ref 11.7–15.4)
WBC: 4.7 10*3/uL (ref 3.4–10.8)

## 2018-10-19 LAB — LIPID PANEL
Chol/HDL Ratio: 2.7 ratio (ref 0.0–4.4)
Cholesterol, Total: 169 mg/dL (ref 100–199)
HDL: 63 mg/dL (ref >39)
LDL Calculated: 95 mg/dL (ref 0–99)
Triglycerides: 57 mg/dL (ref 0–149)
VLDL Cholesterol Cal: 11 mg/dL (ref 5–40)

## 2018-10-19 LAB — TSH: TSH: 2.1 u[IU]/mL (ref 0.450–4.500)

## 2018-10-19 LAB — T4, FREE: Free T4: 1.65 ng/dL (ref 0.82–1.77)

## 2018-10-31 ENCOUNTER — Ambulatory Visit: Payer: 59 | Admitting: Obstetrics and Gynecology

## 2018-10-31 ENCOUNTER — Encounter

## 2018-12-08 ENCOUNTER — Other Ambulatory Visit: Payer: Self-pay

## 2018-12-08 ENCOUNTER — Ambulatory Visit
Admission: RE | Admit: 2018-12-08 | Discharge: 2018-12-08 | Disposition: A | Discharge status: Home / Self Care | Payer: 59 | Source: Ambulatory Visit | Attending: Obstetrics and Gynecology | Admitting: Obstetrics and Gynecology

## 2018-12-08 DIAGNOSIS — E28319 Asymptomatic premature menopause: Secondary | ICD-10-CM

## 2018-12-09 LAB — FECAL OCCULT BLOOD, IMMUNOCHEMICAL: Fecal Occult Bld: NEGATIVE

## 2018-12-09 LAB — SPECIMEN STATUS REPORT

## 2018-12-27 ENCOUNTER — Telehealth: Payer: Self-pay | Admitting: Obstetrics and Gynecology

## 2018-12-27 NOTE — Telephone Encounter (Signed)
[facility] about the patient's bone density from 12/08/18.  She is postmenopausal, and her report used a Z score instead of a T score to determine her result.  She has premature ovarian failure diagnosed at age 48 yo.   Please have the radiologist re-evaluate this report.

## 2018-12-28 NOTE — Telephone Encounter (Signed)
Message to [facility] with request as seen below.

## 2019-01-04 NOTE — Telephone Encounter (Signed)
Updated BMD report not received to date.   Call returned to [facility], spoke with Sula. States BMD has been reviewed, report updated. Advised unable to see updated report in Epic, have not received updated report via fax. She will review with radiologist and provide update.

## 2019-01-04 NOTE — Telephone Encounter (Signed)
Abby returned call. Radiologist to update report today. Return call if unable to view.

## 2019-01-04 NOTE — Telephone Encounter (Signed)
Addended BMD report in Epic.   Routing to Dr. Antony Blackbird.   Encounter closed.

## 2019-01-12 ENCOUNTER — Telehealth: Payer: Self-pay | Admitting: Obstetrics and Gynecology

## 2019-01-12 NOTE — Telephone Encounter (Signed)
Patient is calling regarding BMD results. Patient would like to discuss these results with a nurse.

## 2019-01-12 NOTE — Telephone Encounter (Signed)
Left message to call Mattison Golay, RN at [facility] [phone].   

## 2019-01-12 NOTE — Telephone Encounter (Signed)
Spoke with patient. Patient would like to discuss 12/08/18 BMD results. Recommended OV or MyChart visit with Dr. Quincy Simmonds, patient agreeable. MyChart visit scheduled for 01/15/19 at 9:30am.   Routing to provider for final review. Patient is agreeable to disposition. Will close encounter.

## 2019-01-15 ENCOUNTER — Telehealth (INDEPENDENT_AMBULATORY_CARE_PROVIDER_SITE_OTHER): Payer: 59 | Admitting: Obstetrics and Gynecology

## 2019-01-15 ENCOUNTER — Encounter: Payer: Self-pay | Admitting: Obstetrics and Gynecology

## 2019-01-15 VITALS — Ht 62.75 in | Wt 157.0 lb

## 2019-01-15 DIAGNOSIS — M85859 Other specified disorders of bone density and structure, unspecified thigh: Secondary | ICD-10-CM | POA: Diagnosis not present

## 2019-01-15 NOTE — Progress Notes (Signed)
GYNECOLOGY  VISIT   HPI: 48 y.o.   Married  Caucasian  female   G0P0000 with Patient's last menstrual period was 08/07/2015 (approximate).   here for consult to discuss BMD results. She has premature menopause and has osteopenia.  T score of the hip is -1.5. She is on HRT.     She gives permission for the visit.  Arranged by Glorianne Manchester, RN. Started at 9:33.  Ended 9:59. I am at the office with the door closed.  She is at her office also.   GYNECOLOGIC HISTORY: Patient's last menstrual period was 08/07/2015 (approximate). Contraception: Postmenopausal Menopausal hormone therapy:  Premarin cream, Vivelle Dot 0.05mg , Progesterone 5mg  Last mammogram: 08-22-18 3D Neg/density B/BiRads1 Last pap smear:  06-24-17 Neg:Neg HR HPV        OB History    Gravida  0   Para  0   Term  0   Preterm  0   AB  0   Living  0     SAB  0   TAB  0   Ectopic  0   Multiple  0   Live Births                 There are no active problems to display for this patient.   Past Medical History:  Diagnosis Date  . Anxiety    no meds  . Depression    no meds  . Hormone disorder   . Hypothyroidism   . Premature ovarian failure    Dx'd age 47  . Thyroid disease    hypothyroid    Past Surgical History:  Procedure Laterality Date  . DILATATION & CURETTAGE/HYSTEROSCOPY WITH MYOSURE N/A 10/07/2015   Procedure: DILATATION & CURETTAGE/HYSTEROSCOPY WITH MYOSURE;  Surgeon: Nunzio Cobbs, MD;  Location: [facility];  Service: Gynecology;  Laterality: N/A;  endometrial polyp  . WISDOM TOOTH EXTRACTION      Current Outpatient Medications  Medication Sig Dispense Refill  . conjugated estrogens (PREMARIN) vaginal cream Use 1/2 g vaginally every night at bed time for the first 2 weeks, then use 1/2 g vaginally two or three times per week. 30 g 3  . estradiol (VIVELLE-DOT) 0.05 MG/24HR patch PLACE ONE PATCH ONTO THE SKIN TWO TIMES A WEEK 24 patch 3  . levothyroxine (SYNTHROID) 112 MCG tablet  TAKE 1 TABLET BY MOUTH ONCE DAILY BEFORE BREAKFAST 90 tablet 3  . medroxyPROGESTERone (PROVERA) 5 MG tablet Take 1 tablet (5 mg total) by mouth daily. 90 tablet 3  . Multiple Vitamins-Minerals (MULTIVITAMIN PO) Take 1 tablet by mouth daily.     No current facility-administered medications for this visit.      ALLERGIES: Patient has no known allergies.  Family History  Problem Relation Age of Onset  . Thyroid disease Sister        hypothyroidism  . Hypertension Maternal Grandmother   . Heart disease Maternal Grandmother   . Diabetes Maternal Grandfather   . Other Maternal Grandfather        benign brain tumor  . Multiple sclerosis Father   . Dementia Paternal Grandmother     Social History   Socioeconomic History  . Marital status: Married    Spouse name: Not on file  . Number of children: Not on file  . Years of education: Not on file  . Highest education level: Not on file  Occupational History  . Not on file  Social Needs  . Financial resource strain: Not on  file  . Food insecurity    Worry: Not on file    Inability: Not on file  . Transportation needs    Medical: Not on file    Non-medical: Not on file  Tobacco Use  . Smoking status: Never Smoker  . Smokeless tobacco: Never Used  Substance and Sexual Activity  . Alcohol use: Yes    Alcohol/week: 4.0 standard drinks    Types: 2 Glasses of wine, 2 Standard drinks or equivalent per week  . Drug use: No  . Sexual activity: Yes    Partners: Female, Female    Birth control/protection: None, Post-menopausal  Lifestyle  . Physical activity    Days per week: Not on file    Minutes per session: Not on file  . Stress: Not on file  Relationships  . Social Herbalist on phone: Not on file    Gets together: Not on file    Attends religious service: Not on file    Active member of club or organization: Not on file    Attends meetings of clubs or organizations: Not on file    Relationship status: Not on file   . Intimate partner violence    Fear of current or ex partner: Not on file    Emotionally abused: Not on file    Physically abused: Not on file    Forced sexual activity: Not on file  Other Topics Concern  . Not on file  Social History Narrative  . Not on file    Review of Systems  All other systems reviewed and are negative.   PHYSICAL EXAMINATION:    Ht 5' 2.75" (1.594 m)   Wt 157 lb (71.2 kg)   LMP 08/07/2015 (Approximate)   BMI 28.03 kg/m     General appearance: alert, cooperative and appears stated age   ASSESSMENT  Osteopenia.  Premature menopause.  HRT.   PLAN  We discussed bone density - and the range from normal to osteoporosis and how these are defined.  Z scores and T scores reviewed.  We reviewed the continuum of fracture risk.  Risk factors for osteopenia/osteoporosis discussed.  HRT, calcium 1200 mg daily in divided dosages, bit D 600- 800 IU daily, and weight bearing exercise reviewed.  We reviewed caffeine consumption and bone health by doing a review in Up to Date. FU BMD in 2 years.    An After Visit Summary was printed and given to the patient.  ___25___ minutes face to face time of which over 50% was spent in counseling.

## 2019-03-07 ENCOUNTER — Telehealth: Payer: Self-pay | Admitting: Obstetrics and Gynecology

## 2019-03-07 NOTE — Telephone Encounter (Signed)
Patient is asking for a "mental health referral".

## 2019-03-07 NOTE — Telephone Encounter (Signed)
Spoke back with pt. Pt agreeable to OV. OV scheduled for 03/12/2019 at 1100. Pt verbalize understanding. Will route to Dr Quincy Simmonds for review and will close encounter.

## 2019-03-07 NOTE — Telephone Encounter (Signed)
Spoke with pt. Pt states having been "struggling with depression" for last month. Pt states has hx of depression and has had cognitive therapy in the past. Pt also wanting to know if hormone imbalance? Was ok when here in Aug for AEX. Does pt need OV or just referral?  Will review with Dr Quincy Simmonds and return call to pt. Pt agreeable.   Routing to Dr Quincy Simmonds for review and recommendations.

## 2019-03-07 NOTE — Telephone Encounter (Signed)
Please make an appointment with me so I can help her best.

## 2019-03-08 NOTE — Progress Notes (Signed)
GYNECOLOGY  VISIT   HPI: 49 y.o.   Married  Caucasian  female   [id number] with Patient's last menstrual period was 08/07/2015 (approximate).   here for consult for anxiety and depression for the last month but also last fall.  Currently not working.  Having anxiety in her dreams. Is feeding, bathing, and exercising.  Feeling exhausted.  Decreased sex drive. Really worried about weight gain. Not concerned about insomnia.   Helping her husband at [employer].   They rent a space on [address].  This is going ok.   Feeling hopeless and having negative self talk.  Feeling less worth.  Denies suicide.  Her husband and mother are aware of her struggles currently.  Depression long term.  She has had therapy but not taken medication.  GYNECOLOGIC HISTORY: Patient's last menstrual period was 08/07/2015 (approximate). Contraception:  PMP Menopausal hormone therapy:  Premarin cream, Vivelle Dot 0.05mg  Last mammogram:  08-22-18 3D Neg/density B/BiRads1  Last pap smear:  06-24-17 Neg:Neg HR HPV        OB History    Gravida  0   Para  0   Term  0   Preterm  0   AB  0   Living  0     SAB  0   TAB  0   Ectopic  0   Multiple  0   Live Births                 There are no problems to display for this patient.   Past Medical History:  Diagnosis Date  . Anxiety    no meds  . Depression    no meds  . Hormone disorder   . Hypothyroidism   . Premature ovarian failure    Dx'd age 52  . Thyroid disease    hypothyroid    Past Surgical History:  Procedure Laterality Date  . DILATATION & CURETTAGE/HYSTEROSCOPY WITH MYOSURE N/A 10/07/2015   Procedure: DILATATION & CURETTAGE/HYSTEROSCOPY WITH MYOSURE;  Surgeon: Nunzio Cobbs, MD;  Location: [facility];  Service: Gynecology;  Laterality: N/A;  endometrial polyp  . WISDOM TOOTH EXTRACTION      Current Outpatient Medications  Medication Sig Dispense Refill  . Ascorbic Acid (VITAMIN C) 1000 MG tablet  Take 1,000 mg by mouth daily.    . calcium gluconate 500 MG tablet Take 1 tablet by mouth daily.    . Cholecalciferol (VITAMIN D3 PO) Take by mouth. 2000 IU daily    . estradiol (VIVELLE-DOT) 0.05 MG/24HR patch PLACE ONE PATCH ONTO THE SKIN TWO TIMES A WEEK 24 patch 3  . levothyroxine (SYNTHROID) 112 MCG tablet TAKE 1 TABLET BY MOUTH ONCE DAILY BEFORE BREAKFAST 90 tablet 3  . medroxyPROGESTERone (PROVERA) 5 MG tablet Take 1 tablet (5 mg total) by mouth daily. 90 tablet 3  . Multiple Vitamins-Minerals (MULTIVITAMIN PO) Take 1 tablet by mouth daily.    Marland Kitchen zinc gluconate 50 MG tablet Take 50 mg by mouth daily.     No current facility-administered medications for this visit.     ALLERGIES: Patient has no known allergies.  Family History  Problem Relation Age of Onset  . Thyroid disease Sister        hypothyroidism  . Hypertension Maternal Grandmother   . Heart disease Maternal Grandmother   . Diabetes Maternal Grandfather   . Other Maternal Grandfather        benign brain tumor  . Multiple sclerosis Father   .  Dementia Paternal Grandmother     Social History   Socioeconomic History  . Marital status: Married    Spouse name: Not on file  . Number of children: Not on file  . Years of education: Not on file  . Highest education level: Not on file  Occupational History  . Not on file  Tobacco Use  . Smoking status: Never Smoker  . Smokeless tobacco: Never Used  Substance and Sexual Activity  . Alcohol use: Yes    Alcohol/week: 4.0 standard drinks    Types: 2 Glasses of wine, 2 Standard drinks or equivalent per week  . Drug use: No  . Sexual activity: Yes    Partners: Female, Female    Birth control/protection: None, Post-menopausal  Other Topics Concern  . Not on file  Social History Narrative  . Not on file   Social Determinants of Health   Financial Resource Strain:   . Difficulty of Paying Living Expenses: Not on file  Food Insecurity:   . Worried About Ship broker in the Last Year: Not on file  . Ran Out of Food in the Last Year: Not on file  Transportation Needs:   . Lack of Transportation (Medical): Not on file  . Lack of Transportation (Non-Medical): Not on file  Physical Activity:   . Days of Exercise per Week: Not on file  . Minutes of Exercise per Session: Not on file  Stress:   . Feeling of Stress : Not on file  Social Connections:   . Frequency of Communication with Friends and Family: Not on file  . Frequency of Social Gatherings with Friends and Family: Not on file  . Attends Religious Services: Not on file  . Active Member of Clubs or Organizations: Not on file  . Attends Archivist Meetings: Not on file  . Marital Status: Not on file  Intimate Partner Violence:   . Fear of Current or Ex-Partner: Not on file  . Emotionally Abused: Not on file  . Physically Abused: Not on file  . Sexually Abused: Not on file    Review of Systems  Psychiatric/Behavioral: The patient is nervous/anxious.        Depression  All other systems reviewed and are negative.   PHYSICAL EXAMINATION:    BP 110/64   Pulse 60   Temp (!) 97.2 F (36.2 C) (Temporal)   Ht 5' 2.75" (1.594 m)   Wt 161 lb (73 kg)   LMP 08/07/2015 (Approximate)   BMI 28.75 kg/m     General appearance: alert, cooperative and appears stated age   ASSESSMENT  Depression and anxiety.   PLAN  We discussed medication options for treating depression and anxiety.  We chose Wellbutrin XL due to the low side effect profile for causing weight gain and decreased libido. She understands this can cause insomnia, so she will take in the am.  We reviewed options for counseling also.  She will see who is on her insurance.  We talked about increasing her circle of support.  She will consider making a schedule for herself to follow daily. She knows that [facility] is available if she feels overwhelmed and needs more acute care. Fu with virtual visit in 6  weeks, sooner as needed.   An After Visit Summary was printed and given to the patient.  ___30___ minutes face to face time of which over 50% was spent in counseling.

## 2019-03-09 ENCOUNTER — Other Ambulatory Visit: Payer: Self-pay

## 2019-03-12 ENCOUNTER — Ambulatory Visit: Payer: 59 | Admitting: Obstetrics and Gynecology

## 2019-03-12 ENCOUNTER — Other Ambulatory Visit: Payer: Self-pay

## 2019-03-12 ENCOUNTER — Encounter: Payer: Self-pay | Admitting: Obstetrics and Gynecology

## 2019-03-12 VITALS — BP 110/64 | HR 60 | Temp 97.2°F | Ht 62.75 in | Wt 161.0 lb

## 2019-03-12 DIAGNOSIS — F419 Anxiety disorder, unspecified: Secondary | ICD-10-CM

## 2019-03-12 DIAGNOSIS — F32A Depression, unspecified: Secondary | ICD-10-CM

## 2019-03-12 DIAGNOSIS — F329 Major depressive disorder, single episode, unspecified: Secondary | ICD-10-CM

## 2019-03-12 MED ORDER — BUPROPION HCL ER (XL) 150 MG PO TB24: 150.0000 mg | ORAL_TABLET | Freq: Every day | ORAL | 1 refills | Status: DC

## 2019-03-12 NOTE — Patient Instructions (Signed)

## 2019-04-02 ENCOUNTER — Telehealth: Payer: Self-pay | Admitting: Obstetrics and Gynecology

## 2019-04-02 MED ORDER — SERTRALINE HCL 50 MG PO TABS: 50.0000 mg | ORAL_TABLET | Freq: Every day | ORAL | 1 refills | Status: DC

## 2019-04-02 NOTE — Telephone Encounter (Signed)
Spoke with patient. Patient was seen for OV on 03/12/19, started on Wellbutrin 150mg  daily. Has been on medication now for 2 wks, reports insomnia and "racing thoughts at night". Reports symptoms started immediately. Takes medication in the morning.   Patient states she is aware that trouble sleeping can be a side effect, would like to know if Dr. Quincy Simmonds would recommend continuing medication or switch?   Dr. Quincy Simmonds -please review and advise.

## 2019-04-02 NOTE — Telephone Encounter (Signed)
Spoke with patient, advised per Dr. Quincy Simmonds. Rx for zoloft 50 mg po daily, #30/1RF to verified pharmacy. MyChart visit scheduled for 05/17/19 at 4pm. Patient verbalizes understanding and is agreeable.   Encounter closed.

## 2019-04-02 NOTE — Telephone Encounter (Signed)
I recommend stopping the Wellbutrin and switching to Zoloft 50 mg daily.  Zoloft has a side range of dosages available.   This would be considered a low dosage.  Please have her do a recheck with me in 6 weeks.

## 2019-04-02 NOTE — Telephone Encounter (Signed)
Patient is having side effects from the new medication.

## 2019-04-17 ENCOUNTER — Telehealth: Payer: Self-pay | Admitting: Obstetrics and Gynecology

## 2019-04-17 DIAGNOSIS — F329 Major depressive disorder, single episode, unspecified: Secondary | ICD-10-CM

## 2019-04-17 DIAGNOSIS — F32A Depression, unspecified: Secondary | ICD-10-CM

## 2019-04-17 NOTE — Telephone Encounter (Signed)
Please contact patient in follow up to her need for treatment for depression.  She did not tolerate Wellbutrin and she had a rash with Zoloft.   I recommend that she see a psychiatrist for treatment of the depression.  A psychiatrist is an expert in prescribing these types of medications.  I want to be sure she is getting the help she needs and want to minimize any side effects of medication.   I recommend cancelling the upcoming web visit with me and instead make a referral to [facility].

## 2019-04-17 NOTE — Telephone Encounter (Signed)
Patient is having side effects from the new medication.

## 2019-04-17 NOTE — Telephone Encounter (Signed)
Spoke to pt. Pt states having reaction with Zoloft since starting 2 weeks ago on 04/05/2019. Pt states having hives/rash since Saturday with diarrhea on Friday. Pt has not stopped taking Zoloft. Took dose last night. Pt has rash on neck and arms and torso. Pt instructed to stop taking Zoloft now. Pt agreeable. Pt instructed to seek emergency care if having trouble breathing or swelling of throat or tongue. Pt agreeable. Pt also states seeing therapist.   Pt rescheduled for med recheck due to having reaction. Pt scheduled for web visit to discuss other options for depression medication on 04/23/2019 at 1130 am. Pt agreeable and verbalized understanding of appt date and time.   Routing to Dr Quincy Simmonds for review and will close encounter.

## 2019-04-17 NOTE — Telephone Encounter (Signed)
Spoke with patient. Advised as seen below per Dr. Quincy Simmonds. MyChart visit cancelled. Patient states she would like to check with her insurance provider for covered providers, will return call to office to advise where she would like referral to be placed. Questions answered. Patient verbalizes understanding and is agreeable.

## 2019-04-20 NOTE — Telephone Encounter (Signed)
Spoke with patient. Patient request referral to be placed to [facility], order placed. Advised patient our office referral coordinator will f/u with appt details once scheduled. Patient request female provider if possible.   Routing to provider for final review. Patient is agreeable to disposition. Will close encounter.  Cc: Magdalene Patricia

## 2019-04-20 NOTE — Telephone Encounter (Signed)
Patient is returning a call to Jill. °

## 2019-04-23 ENCOUNTER — Telehealth: Payer: 59 | Admitting: Obstetrics and Gynecology

## 2019-05-17 ENCOUNTER — Telehealth: Payer: 59 | Admitting: Obstetrics and Gynecology

## 2019-05-22 ENCOUNTER — Encounter: Payer: Self-pay | Admitting: Psychiatry

## 2019-05-22 ENCOUNTER — Ambulatory Visit (INDEPENDENT_AMBULATORY_CARE_PROVIDER_SITE_OTHER): Payer: 59 | Admitting: Psychiatry

## 2019-05-22 ENCOUNTER — Other Ambulatory Visit: Payer: Self-pay

## 2019-05-22 VITALS — BP 110/64 | HR 62 | Ht 63.0 in | Wt 145.0 lb

## 2019-05-22 DIAGNOSIS — F331 Major depressive disorder, recurrent, moderate: Secondary | ICD-10-CM | POA: Diagnosis not present

## 2019-05-22 DIAGNOSIS — F411 Generalized anxiety disorder: Secondary | ICD-10-CM | POA: Diagnosis not present

## 2019-05-22 MED ORDER — ESCITALOPRAM OXALATE 5 MG PO TABS: 5.0000 mg | ORAL_TABLET | Freq: Every day | ORAL | 1 refills | Status: DC

## 2019-05-22 NOTE — Progress Notes (Signed)
[facility] MD/PA/NP Initial Note  05/23/2019 4:05 PM Michaela Roman  MRN:  [id number]  Chief Complaint:  Chief Complaint    Depression; Anxiety      HPI: Patient is a 49 year old female being seen for initial evaluation for anxiety and depression.  She reports that she has had depression and anxiety since she was a teenager and has been in and out of therapy for about 2 years when s/s occur. Notices that depressive episodes occur about every 6-7 years with periods without any depressive s/s between episodes. Last therapy 2017. Anxiety increases with depression and reports that there is also some constant low level depression.  She reports that she started having some depression around September of 2020. She reports that her husband had talked to her about starting a job with more routine pay and this triggered anxiety and depression.  She reports that she experienced some SIB with beating herself on the chest and stomach for the first time in about a decade. These behaviors abated and returned in December. She reports that she was having difficulty getting relief from usual coping strategies (meditation, deep breathing, journaling, etc).   She reports long-standing anxiety and describes fear about the future. Catastrophic thinking. Some rumination. Stomach and throat will tighten with anxiety. Breath can become shallow. She reports some anxiety when her schedule is disrupted. Denies obsessions or compulsions. Denies rituals or routines. Occ anxiety in social situations. Some anxiety about her weight and what she is eating. Denies food restriction. Reports that in the past she would be obsessive about exercise and feel anxious if she did not exercise daily. H/o binge eating. Denies purging.   She reports that crying has decreased recently. She reports that she is now able to write again. Continues to experience persistent sadness. She reports some irritability and that decreased sleep  contributes to this. Difficulty staying asleep and having middle of the night awakenings. Can usually fall back to sleep in 15-20 minutes now and it was longer before. Estimates sleeping about 5 hours a night. Sleep hygiene has helped with sleep initiation but not maintenance. Appetite has been good. Has gone down one clothing size since August. Energy was low several months ago with some slight improvement this month. Motivation has improved some and is back into a regular writing routine and doing more things around the house. Has had more desire for social interaction after being being more withdrawn in the last few months. Has been more interested in things with other people. Had severe difficulty with concentration in the last several months. Concentration has improved some recently but remains less than usual. Occ passive death wishes. Denies SI.   Denies any past manic s/s. Denies AH or VH. Denies paranoia.   In January her mother encouraged her to seek treatment.   Born and raised in [place]. Has an older brother and a younger sister. Parents were together. Before pt was born, father had MS s/s and was not dx'd until pt was 60-68 years old. Reports that father's health put a strain in the family. Mother completed her degree and returned to work full-time. Recalls feeling as if she was on her own on a lot of things. Reports that she was bullied as a child. 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 Had to wear full length cast in Kindergarten. Close with sister. Less close with brother. Has a close relationship with mother and does not feel close with father.  Has a bachelor's degree. Has been working as a Media planner fiction and fantasy. Active in politics and volunteering. Married almost 15 years. Reports that things have recently improved with their relationship. No other marriages. No children. Sister is in [place], best friend in  [place], and mom in [place]. Enjoys walking and hiking and being outside. Enjoys Firefighter.   Past Psychiatric Medication Trials: Wellbutrin- Insomnia, racing thoughts Stopped after 2 weeks.  Zoloft- Took for a few weeks. Had insomnia. Developed hives.   Visit Diagnosis:    ICD-10-CM   1. GAD (generalized anxiety disorder)  F41.1 escitalopram (LEXAPRO) 5 MG tablet  2. Moderate recurrent major depression (HCC)  F33.1 escitalopram (LEXAPRO) 5 MG tablet    Past Psychiatric History: Has seen different therapists in the past since she was a teenager. Was seeing a therapist with [facility]. Started seeing Vanetta Mulders, LCSW last week.   Past Medical History:  Past Medical History:  Diagnosis Date  . Anxiety    no meds  . Depression    no meds  . Hormone disorder   . Hypothyroidism   . Premature ovarian failure    Dx'd age 56  . Thyroid disease    hypothyroid    Past Surgical History:  Procedure Laterality Date  . DILATATION & CURETTAGE/HYSTEROSCOPY WITH MYOSURE N/A 10/07/2015   Procedure: DILATATION & CURETTAGE/HYSTEROSCOPY WITH MYOSURE;  Surgeon: Nunzio Cobbs, MD;  Location: [facility];  Service: Gynecology;  Laterality: N/A;  endometrial polyp  . WISDOM TOOTH EXTRACTION      Family History:  Family History  Problem Relation Age of Onset  . Thyroid disease Sister        hypothyroidism  . Hypertension Maternal Grandmother   . Heart disease Maternal Grandmother   . Depression Maternal Grandmother   . Diabetes Maternal Grandfather   . Other Maternal Grandfather        benign brain tumor  . Depression Mother   . Multiple sclerosis Father   . Dementia Paternal Grandmother     Social History:  Social History   Socioeconomic History  . Marital status: Married    Spouse name: Not on file  . Number of children: Not on file  . Years of education: Not on file  . Highest education level: Not on file  Occupational History  . Not on file  Tobacco Use  .  Smoking status: Never Smoker  . Smokeless tobacco: Never Used  Substance and Sexual Activity  . Alcohol use: Yes    Alcohol/week: 4.0 standard drinks    Types: 2 Glasses of wine, 2 Standard drinks or equivalent per week    Comment: one drink a week  . Drug use: No  . Sexual activity: Yes    Partners: Female, Female    Birth control/protection: None, Post-menopausal  Other Topics Concern  . Not on file  Social History Narrative  . Not on file   Social Determinants of Health   Financial Resource Strain:   . Difficulty of Paying Living Expenses:   Food Insecurity:   . Worried About Charity fundraiser in the Last Year:   . Arboriculturist in the Last Year:   Transportation Needs:   . Film/video editor (Medical):   Marland Kitchen Lack of Transportation (Non-Medical):   Physical Activity:   . Days of Exercise per Week:   . Minutes of Exercise per Session:   Stress:   . Feeling of Stress :  Social Connections:   . Frequency of Communication with Friends and Family:   . Frequency of Social Gatherings with Friends and Family:   . Attends Religious Services:   . Active Member of Clubs or Organizations:   . Attends Archivist Meetings:   Marland Kitchen Marital Status:     Allergies:  Allergies  Allergen Reactions  . Sertraline Hives    Metabolic Disorder Labs: No results found for: HGBA1C, MPG No results found for: PROLACTIN Lab Results  Component Value Date   CHOL 169 10/17/2018   TRIG 57 10/17/2018   HDL 63 10/17/2018   CHOLHDL 2.7 10/17/2018   VLDL 12 08/22/2015   LDLCALC 95 10/17/2018   LDLCALC 92 08/29/2017   Lab Results  Component Value Date   TSH 2.100 10/17/2018   TSH 0.763 08/29/2017    Therapeutic Level Labs: No results found for: LITHIUM No results found for: VALPROATE No components found for:  CBMZ  Current Medications: Current Outpatient Medications  Medication Sig Dispense Refill  . Ascorbic Acid (VITAMIN C) 1000 MG tablet Take 1,000 mg by mouth daily.     . calcium gluconate 500 MG tablet Take 1 tablet by mouth daily.    . Cholecalciferol (VITAMIN D3 PO) Take by mouth. 2000 IU daily    . estradiol (VIVELLE-DOT) 0.05 MG/24HR patch PLACE ONE PATCH ONTO THE SKIN TWO TIMES A WEEK 24 patch 3  . levothyroxine (SYNTHROID) 112 MCG tablet TAKE 1 TABLET BY MOUTH ONCE DAILY BEFORE BREAKFAST 90 tablet 3  . medroxyPROGESTERone (PROVERA) 5 MG tablet Take 1 tablet (5 mg total) by mouth daily. 90 tablet 3  . Multiple Vitamins-Minerals (MULTIVITAMIN PO) Take 1 tablet by mouth daily.    Marland Kitchen zinc gluconate 50 MG tablet Take 50 mg by mouth daily.    Marland Kitchen escitalopram (LEXAPRO) 5 MG tablet Take 1 tablet (5 mg total) by mouth daily. 30 tablet 1   No current facility-administered medications for this visit.    Medication Side Effects: N/A  Orders placed this visit:  No orders of the defined types were placed in this encounter.   Psychiatric Specialty Exam:  Review of Systems  Blood pressure 110/64, pulse 62, height 5\' 3"  (1.6 m), weight 145 lb (65.8 kg), last menstrual period 08/07/2015.Body mass index is 25.69 kg/m.  General Appearance: Casual  Eye Contact:  Good  Speech:  Clear and Coherent and Normal Rate  Volume:  Normal  Mood:  Anxious and Depressed  Affect:  Congruent, Depressed and Anxious  Thought Process:  Coherent, Linear and Descriptions of Associations: Intact  Orientation:  Full (Time, Place, and Person)  Thought Content: Logical and Hallucinations: None   Suicidal Thoughts:  No  Homicidal Thoughts:  No  Memory:  WNL  Judgement:  Good  Insight:  Good  Psychomotor Activity:  Normal  Concentration:  Concentration: Good and Attention Span: Good  Recall:  Good  Fund of Knowledge: Good  Language: Good  Assets:  Communication Skills Desire for Improvement Resilience Social Support Talents/Skills  ADL's:  Intact  Cognition: WNL  Prognosis:  Good   Receiving Psychotherapy: Yes   Treatment Plan/Recommendations: Discussed several  possible treatment options and reviewed different types of medications and their proposed mechanism of action.  Discussed considering a different SSRI since patient had an allergic reaction to sertraline and SSRIs may be able to treat both anxiety and depressive signs and symptoms.  Discussed potential benefits, risks, and side effects of Lexapro and patient agrees to trial of Lexapro.  Discussed starting with low dose Lexapro 5 mg p.o. daily for anxiety depression and then increasing based upon tolerability.  Recommend patient continue psychotherapy.  Patient to follow-up with this provider in 4 weeks or sooner if clinically indicated. Patient advised to contact office with any questions, adverse effects, or acute worsening in signs and symptoms.   Thayer Headings, PMHNP

## 2019-05-23 ENCOUNTER — Telehealth: Payer: Self-pay | Admitting: Obstetrics and Gynecology

## 2019-05-23 NOTE — Telephone Encounter (Signed)
Ok for Levothyroxine 112 mcg daily.  #90, RF until annual exam is due.

## 2019-05-23 NOTE — Telephone Encounter (Signed)
[facility] calling to speak with nurse regarding patient's Levothroxine prescription. [phone]

## 2019-05-23 NOTE — Telephone Encounter (Signed)
Spoke with Gerald Stabs at [facility].  Patient has RX on file for levothyroxine 112 mcg tab.  Manufacture has changed, ok to fill levothyroxine, requires provider authorization when manufacture changes.   Advised will review with provider and f/u.  Dr. Quincy Simmonds -ok fill levothyroxine, new manufacture?

## 2019-05-24 NOTE — Telephone Encounter (Signed)
Reviewed with Dr. Quincy Simmonds.  Call returned to [facility], spoke with [employer]. Authorized to dispense Levothyroxine 112 mcg, new manufacture.  Does not require new RX.    Call to patient, left detailed message, ok per dpr, advised as seen above. Advised patient to return call to office or pharmacy if any additional questions.   Encounter closed.

## 2019-06-19 ENCOUNTER — Ambulatory Visit (INDEPENDENT_AMBULATORY_CARE_PROVIDER_SITE_OTHER): Payer: 59 | Admitting: Psychiatry

## 2019-06-19 ENCOUNTER — Encounter: Payer: Self-pay | Admitting: Psychiatry

## 2019-06-19 ENCOUNTER — Other Ambulatory Visit: Payer: Self-pay

## 2019-06-19 DIAGNOSIS — F411 Generalized anxiety disorder: Secondary | ICD-10-CM | POA: Diagnosis not present

## 2019-06-19 DIAGNOSIS — F330 Major depressive disorder, recurrent, mild: Secondary | ICD-10-CM

## 2019-06-19 MED ORDER — ESCITALOPRAM OXALATE 5 MG PO TABS: 5.0000 mg | ORAL_TABLET | Freq: Every day | ORAL | 1 refills | Status: DC

## 2019-06-19 NOTE — Progress Notes (Signed)
Michaela Roman [id number] 22-Aug-1970 49 y.o.  Subjective:   Patient ID:  Michaela Roman is a 49 y.o. (DOB February 11, 1971) female.  Chief Complaint:  Chief Complaint  Patient presents with  . Follow-up    Depression, Anxiety    HPI [name] presents to the office today for follow-up of anxiety and depression. She reports that she had some insomnia initially and this subsided and is now sleeping better. Reports that she  Is awakening once during the night and is able to return to sleep, which is an improvement compared to before starting Lexapro. She reports that "my mood seems to have evened out... a little bit of the edge of the anxiety has been taken off." She reports that she continues to have some depression and anxiety "but not as deep." She reports that she now notices more relief and enjoyment from things that she has been doing to cope, such as deep breathing, exercise, meditation, and journaling. Continues to notices some chest, stomach, or shoulder tightening with anxiety. She reports that her worry has been "a little bit less." Continued catastrophic thinking. Less rumination. Some irritability. Appetite has been good. Reports that crying has continued to improve. Energy and motivation have been higher. Concentration has improved. Started volunteering again with an organization. Has been socializing more now that she and her husband have been fully vaccinated. She reports that she has been surprised by how much she has enjoyed socializing recently.   Denies any recent SIB. Denies SI.   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  Continues to see therapist regularly.    Past Psychiatric Medication Trials: Wellbutrin- Insomnia, racing thoughts Stopped after 2 weeks.  Zoloft- Took for a few weeks. Had insomnia. Developed hives.  Lexapro    Review of  Systems:  Review of Systems  Gastrointestinal: Negative.   Musculoskeletal: Negative for gait problem.  Neurological: Negative for tremors and headaches.  Psychiatric/Behavioral:       Please refer to HPI   Notices she has been dropping things more.   Medications: I have reviewed the patient's current medications.  Current Outpatient Medications  Medication Sig Dispense Refill  . Ascorbic Acid (VITAMIN C) 1000 MG tablet Take 1,000 mg by mouth daily.    . calcium gluconate 500 MG tablet Take 1 tablet by mouth daily.    . Cholecalciferol (VITAMIN D3 PO) Take by mouth. 2000 IU daily    . escitalopram (LEXAPRO) 5 MG tablet Take 1 tablet (5 mg total) by mouth daily. 30 tablet 1  . estradiol (VIVELLE-DOT) 0.05 MG/24HR patch PLACE ONE PATCH ONTO THE SKIN TWO TIMES A WEEK 24 patch 3  . levothyroxine (SYNTHROID) 112 MCG tablet TAKE 1 TABLET BY MOUTH ONCE DAILY BEFORE BREAKFAST 90 tablet 3  . medroxyPROGESTERone (PROVERA) 5 MG tablet Take 1 tablet (5 mg total) by mouth daily. 90 tablet 3  . Multiple Vitamins-Minerals (MULTIVITAMIN PO) Take 1 tablet by mouth daily.    Marland Kitchen zinc gluconate 50 MG tablet Take 50 mg by mouth daily.     No current facility-administered medications for this visit.    Medication Side Effects: None  Allergies:  Allergies  Allergen Reactions  . Sertraline Hives    Past Medical History:  Diagnosis Date  . Anxiety    no meds  . Depression    no meds  . Hormone disorder   . Hypothyroidism   .  Premature ovarian failure    Dx'd age 59  . Thyroid disease    hypothyroid    Family History  Problem Relation Age of Onset  . Thyroid disease Sister        hypothyroidism  . Hypertension Maternal Grandmother   . Heart disease Maternal Grandmother   . Depression Maternal Grandmother   . Diabetes Maternal Grandfather   . Other Maternal Grandfather        benign brain tumor  . Depression Mother   . Multiple sclerosis Father   . Dementia Paternal Grandmother      Social History   Socioeconomic History  . Marital status: Married    Spouse name: Not on file  . Number of children: Not on file  . Years of education: Not on file  . Highest education level: Not on file  Occupational History  . Not on file  Tobacco Use  . Smoking status: Never Smoker  . Smokeless tobacco: Never Used  Substance and Sexual Activity  . Alcohol use: Yes    Alcohol/week: 4.0 standard drinks    Types: 2 Glasses of wine, 2 Standard drinks or equivalent per week    Comment: one drink a week  . Drug use: No  . Sexual activity: Yes    Partners: Female, Female    Birth control/protection: None, Post-menopausal  Other Topics Concern  . Not on file  Social History Narrative  . Not on file   Social Determinants of Health   Financial Resource Strain:   . Difficulty of Paying Living Expenses:   Food Insecurity:   . Worried About Charity fundraiser in the Last Year:   . Arboriculturist in the Last Year:   Transportation Needs:   . Film/video editor (Medical):   Marland Kitchen Lack of Transportation (Non-Medical):   Physical Activity:   . Days of Exercise per Week:   . Minutes of Exercise per Session:   Stress:   . Feeling of Stress :   Social Connections:   . Frequency of Communication with Friends and Family:   . Frequency of Social Gatherings with Friends and Family:   . Attends Religious Services:   . Active Member of Clubs or Organizations:   . Attends Archivist Meetings:   Marland Kitchen Marital Status:   Intimate Partner Violence:   . Fear of Current or Ex-Partner:   . Emotionally Abused:   Marland Kitchen Physically Abused:   . Sexually Abused:     Past Medical History, Surgical history, Social history, and Family history were reviewed and updated as appropriate.   Please see review of systems for further details on the patient's review from today.   Objective:   Physical Exam:  LMP 08/07/2015 (Approximate)   Physical Exam Constitutional:      General: She is  not in acute distress. Musculoskeletal:        General: No deformity.  Neurological:     Mental Status: She is alert and oriented to person, place, and time.     Coordination: Coordination normal.  Psychiatric:        Attention and Perception: Attention and perception normal. She does not perceive auditory or visual hallucinations.        Mood and Affect: Mood normal. Mood is not anxious or depressed. Affect is not labile, blunt, angry or inappropriate.        Speech: Speech normal.        Behavior: Behavior normal.  Thought Content: Thought content normal. Thought content is not paranoid or delusional. Thought content does not include homicidal or suicidal ideation. Thought content does not include homicidal or suicidal plan.        Cognition and Memory: Cognition and memory normal.        Judgment: Judgment normal.     Comments: Insight intact     Lab Review:     Component Value Date/Time   NA 140 10/17/2018 0922   K 4.2 10/17/2018 0922   CL 103 10/17/2018 0922   CO2 25 10/17/2018 0922   GLUCOSE 81 10/17/2018 0922   GLUCOSE 85 08/22/2015 1653   BUN 12 10/17/2018 0922   CREATININE 0.97 10/17/2018 0922   CREATININE 0.95 08/22/2015 1653   CALCIUM 8.9 10/17/2018 0922   PROT 6.5 10/17/2018 0922   ALBUMIN 4.5 10/17/2018 0922   AST 20 10/17/2018 0922   ALT 16 10/17/2018 0922   ALKPHOS 41 10/17/2018 0922   BILITOT 0.7 10/17/2018 0922   GFRNONAA 70 10/17/2018 0922   GFRAA 81 10/17/2018 0922       Component Value Date/Time   WBC 4.7 10/17/2018 0922   WBC 5.4 10/07/2015 0620   RBC 4.40 10/17/2018 0922   RBC 4.21 10/07/2015 0620   HGB 13.5 10/17/2018 0922   HCT 40.9 10/17/2018 0922   PLT 188 10/17/2018 0922   MCV 93 10/17/2018 0922   MCH 30.7 10/17/2018 0922   MCH 31.6 10/07/2015 0620   MCHC 33.0 10/17/2018 0922   MCHC 35.3 10/07/2015 0620   RDW 13.1 10/17/2018 0922    No results found for: POCLITH, LITHIUM   No results found for: PHENYTOIN, PHENOBARB,  VALPROATE, CBMZ   .res Assessment: Plan:   Discussed option to increase Lexapro to 10 mg po qd or continuing Lexapro 5 mg po qd. Pt reports that she would like to continue Lexapro 5 mg po qd for another month. Will continue Lexapro 5 mg po qd for mood and anxiety. Pt to f/u in 4 weeks or sooner if clinically indicated. Patient advised to contact office with any questions, adverse effects, or acute worsening in signs and symptoms.  Crusita was seen today for follow-up.  Diagnoses and all orders for this visit:  GAD (generalized anxiety disorder) -     escitalopram (LEXAPRO) 5 MG tablet; Take 1 tablet (5 mg total) by mouth daily.  Mild episode of recurrent major depressive disorder (HCC) -     escitalopram (LEXAPRO) 5 MG tablet; Take 1 tablet (5 mg total) by mouth daily.     Please see After Visit Summary for patient specific instructions.  Future Appointments  Date Time Provider [place]  07/17/2019  9:30 AM Thayer Headings, PMHNP [facility] None  10/18/2019  3:00 PM Nunzio Cobbs, MD [place] None    No orders of the defined types were placed in this encounter.   -------------------------------

## 2019-07-12 ENCOUNTER — Other Ambulatory Visit: Payer: Self-pay | Admitting: Obstetrics and Gynecology

## 2019-07-12 DIAGNOSIS — Z1231 Encounter for screening mammogram for malignant neoplasm of breast: Secondary | ICD-10-CM

## 2019-07-17 ENCOUNTER — Ambulatory Visit (INDEPENDENT_AMBULATORY_CARE_PROVIDER_SITE_OTHER): Payer: 59 | Admitting: Psychiatry

## 2019-07-17 ENCOUNTER — Other Ambulatory Visit: Payer: Self-pay

## 2019-07-17 ENCOUNTER — Encounter: Payer: Self-pay | Admitting: Psychiatry

## 2019-07-17 DIAGNOSIS — F330 Major depressive disorder, recurrent, mild: Secondary | ICD-10-CM

## 2019-07-17 DIAGNOSIS — F411 Generalized anxiety disorder: Secondary | ICD-10-CM | POA: Diagnosis not present

## 2019-07-17 MED ORDER — ESCITALOPRAM OXALATE 5 MG PO TABS: 5.0000 mg | ORAL_TABLET | Freq: Every day | ORAL | 0 refills | Status: DC

## 2019-07-17 NOTE — Progress Notes (Signed)
Michaela Roman [id number] Dec 30, 1970 49 y.o.  Subjective:   Patient ID:  Michaela Roman is a 49 y.o. (DOB [birth year]) female.  Chief Complaint:  Chief Complaint  Patient presents with  . Follow-up    Anxiety, depression    HPI APIPHANY TROCHEZ presents to the office today for follow-up of anxiety and depression. She reports that she will experience occasional sadness in response to certain triggers, such as therapy sessions. She reports that she has not had any uncontrolled crying. She reports some irritability and reports that this has "been about the same." She reports that her sleep has been consistently better. She reports that she has been feeling less anxious. She reports occasional anxious thoughts and is able to recognize these thoughts and redirect them. Denies any recent physical s/s of anxiety. Has been able to take streps towards finding another job and has had less anxiety and depression around this. She reports that she was able to make a long term plan and setting goals over the weekend. She has been socializing more and enjoying this. Has also been doing a 30-day yoga program and has been going on more walks. Has been writing a lot. She reports that her energy and motivation have continued to improve and are now good. Appetite has been good. She reports that her concentration has been good. Denies SI.   Continues to see her therapist.     Review of Systems:  Review of Systems  Gastrointestinal: Negative.   Musculoskeletal: Negative for gait problem.  Neurological: Negative for tremors and headaches.  Psychiatric/Behavioral:       Please refer to HPI    Medications: I have reviewed the patient's current medications.  Current Outpatient Medications  Medication Sig Dispense Refill  . Ascorbic Acid (VITAMIN C) 1000 MG tablet Take 1,000 mg by mouth daily.    . calcium gluconate 500 MG tablet Take 1 tablet by mouth daily.    Marland Kitchen escitalopram (LEXAPRO) 5  MG tablet Take 1 tablet (5 mg total) by mouth daily. 90 tablet 0  . estradiol (VIVELLE-DOT) 0.05 MG/24HR patch PLACE ONE PATCH ONTO THE SKIN TWO TIMES A WEEK 24 patch 3  . levothyroxine (SYNTHROID) 112 MCG tablet TAKE 1 TABLET BY MOUTH ONCE DAILY BEFORE BREAKFAST 90 tablet 3  . medroxyPROGESTERone (PROVERA) 5 MG tablet Take 1 tablet (5 mg total) by mouth daily. 90 tablet 3  . Multiple Vitamins-Minerals (MULTIVITAMIN PO) Take 1 tablet by mouth daily.    Marland Kitchen UNABLE TO FIND CBD Oil 10 mg    . zinc gluconate 50 MG tablet Take 50 mg by mouth daily.     No current facility-administered medications for this visit.    Medication Side Effects: None  Allergies:  Allergies  Allergen Reactions  . Sertraline Hives    Past Medical History:  Diagnosis Date  . Anxiety    no meds  . Depression    no meds  . Hormone disorder   . Hypothyroidism   . Premature ovarian failure    Dx'd age 35  . Thyroid disease    hypothyroid    Family History  Problem Relation Age of Onset  . Thyroid disease Sister        hypothyroidism  . Hypertension Maternal Grandmother   . Heart disease Maternal Grandmother   . Depression Maternal Grandmother   . Diabetes Maternal Grandfather   . Other Maternal Grandfather        benign brain tumor  . Depression Mother   .  Multiple sclerosis Father   . Dementia Paternal Grandmother     Social History   Socioeconomic History  . Marital status: Married    Spouse name: Not on file  . Number of children: Not on file  . Years of education: Not on file  . Highest education level: Not on file  Occupational History  . Not on file  Tobacco Use  . Smoking status: Never Smoker  . Smokeless tobacco: Never Used  Substance and Sexual Activity  . Alcohol use: Yes    Alcohol/week: 4.0 standard drinks    Types: 2 Glasses of wine, 2 Standard drinks or equivalent per week    Comment: one drink a week  . Drug use: No  . Sexual activity: Yes    Partners: Female, Female     Birth control/protection: None, Post-menopausal  Other Topics Concern  . Not on file  Social History Narrative  . Not on file   Social Determinants of Health   Financial Resource Strain:   . Difficulty of Paying Living Expenses:   Food Insecurity:   . Worried About Charity fundraiser in the Last Year:   . Arboriculturist in the Last Year:   Transportation Needs:   . Film/video editor (Medical):   Marland Kitchen Lack of Transportation (Non-Medical):   Physical Activity:   . Days of Exercise per Week:   . Minutes of Exercise per Session:   Stress:   . Feeling of Stress :   Social Connections:   . Frequency of Communication with Friends and Family:   . Frequency of Social Gatherings with Friends and Family:   . Attends Religious Services:   . Active Member of Clubs or Organizations:   . Attends Archivist Meetings:   Marland Kitchen Marital Status:   Intimate Partner Violence:   . Fear of Current or Ex-Partner:   . Emotionally Abused:   Marland Kitchen Physically Abused:   . Sexually Abused:     Past Medical History, Surgical history, Social history, and Family history were reviewed and updated as appropriate.   Please see review of systems for further details on the patient's review from today.   Objective:   Physical Exam:  LMP 08/07/2015 (Approximate)   Physical Exam Constitutional:      General: She is not in acute distress. Musculoskeletal:        General: No deformity.  Neurological:     Mental Status: She is alert and oriented to person, place, and time.     Coordination: Coordination normal.  Psychiatric:        Attention and Perception: Attention and perception normal. She does not perceive auditory or visual hallucinations.        Mood and Affect: Mood normal. Mood is not anxious or depressed. Affect is not labile, blunt, angry or inappropriate.        Speech: Speech normal.        Behavior: Behavior normal.        Thought Content: Thought content normal. Thought content is not  paranoid or delusional. Thought content does not include homicidal or suicidal ideation. Thought content does not include homicidal or suicidal plan.        Cognition and Memory: Cognition and memory normal.        Judgment: Judgment normal.     Comments: Insight intact     Lab Review:     Component Value Date/Time   NA 140 10/17/2018 0922   K 4.2 10/17/2018  0922   CL 103 10/17/2018 0922   CO2 25 10/17/2018 0922   GLUCOSE 81 10/17/2018 0922   GLUCOSE 85 08/22/2015 1653   BUN 12 10/17/2018 0922   CREATININE 0.97 10/17/2018 0922   CREATININE 0.95 08/22/2015 1653   CALCIUM 8.9 10/17/2018 0922   PROT 6.5 10/17/2018 0922   ALBUMIN 4.5 10/17/2018 0922   AST 20 10/17/2018 0922   ALT 16 10/17/2018 0922   ALKPHOS 41 10/17/2018 0922   BILITOT 0.7 10/17/2018 0922   GFRNONAA 70 10/17/2018 0922   GFRAA 81 10/17/2018 0922       Component Value Date/Time   WBC 4.7 10/17/2018 0922   WBC 5.4 10/07/2015 0620   RBC 4.40 10/17/2018 0922   RBC 4.21 10/07/2015 0620   HGB 13.5 10/17/2018 0922   HCT 40.9 10/17/2018 0922   PLT 188 10/17/2018 0922   MCV 93 10/17/2018 0922   MCH 30.7 10/17/2018 0922   MCH 31.6 10/07/2015 0620   MCHC 33.0 10/17/2018 0922   MCHC 35.3 10/07/2015 0620   RDW 13.1 10/17/2018 0922    No results found for: POCLITH, LITHIUM   No results found for: PHENYTOIN, PHENOBARB, VALPROATE, CBMZ   .res Assessment: Plan:   Patient seen for 30 minutes and time spent discussing long-term treatment plan and discussing that Lexapro could be taken long-term to prevent recurrence of depression and anxiety.  Recommended continuing treatment for at least 9 months and studies indicate that risk of recurrence decreases with taking SSRIs for at least 9 months.  Discussed continuing Lexapro 5 mg daily since patient reports that there has been significant improvement in her mood and anxiety signs and symptoms with Lexapro 5 mg daily.  Also discussed patient's question about possible therapy  referrals since her insurance is changing and her current therapist no longer being in network.  Discussed several possible therapy referrals and contacted therapist that uses modalities similar to what patient is seeking. Patient to follow-up with this provider in 3 to 4 months or sooner if clinically indicated. Patient advised to contact office with any questions, adverse effects, or acute worsening in signs and symptoms.  Miriya was seen today for follow-up.  Diagnoses and all orders for this visit:  GAD (generalized anxiety disorder) -     escitalopram (LEXAPRO) 5 MG tablet; Take 1 tablet (5 mg total) by mouth daily.  Mild episode of recurrent major depressive disorder (HCC) -     escitalopram (LEXAPRO) 5 MG tablet; Take 1 tablet (5 mg total) by mouth daily.     Please see After Visit Summary for patient specific instructions.  Future Appointments  Date Time Provider [place]  08/27/2019  4:10 PM [facility]  10/18/2019  8:30 AM Thayer Headings, PMHNP [facility] None  10/18/2019  3:00 PM Amundson Raliegh Ip, MD [place] None    No orders of the defined types were placed in this encounter.   -------------------------------

## 2019-08-16 ENCOUNTER — Encounter: Payer: Self-pay | Admitting: Addiction (Substance Use Disorder)

## 2019-08-16 ENCOUNTER — Other Ambulatory Visit: Payer: Self-pay

## 2019-08-16 ENCOUNTER — Ambulatory Visit (INDEPENDENT_AMBULATORY_CARE_PROVIDER_SITE_OTHER): Payer: 59 | Admitting: Addiction (Substance Use Disorder)

## 2019-08-16 DIAGNOSIS — F331 Major depressive disorder, recurrent, moderate: Secondary | ICD-10-CM | POA: Diagnosis not present
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
## 2019-08-24 DIAGNOSIS — Z1231 Encounter for screening mammogram for malignant neoplasm of breast: Secondary | ICD-10-CM

## 2019-08-27 ENCOUNTER — Ambulatory Visit
Admission: RE | Admit: 2019-08-27 | Discharge: 2019-08-27 | Disposition: A | Discharge status: Home / Self Care | Payer: 59 | Source: Ambulatory Visit | Attending: Obstetrics and Gynecology | Admitting: Obstetrics and Gynecology

## 2019-08-27 ENCOUNTER — Other Ambulatory Visit: Payer: Self-pay

## 2019-08-27 DIAGNOSIS — Z1231 Encounter for screening mammogram for malignant neoplasm of breast: Secondary | ICD-10-CM

## 2019-09-04 ENCOUNTER — Other Ambulatory Visit: Payer: Self-pay

## 2019-09-04 ENCOUNTER — Encounter: Payer: Self-pay | Admitting: Addiction (Substance Use Disorder)

## 2019-09-04 ENCOUNTER — Ambulatory Visit (INDEPENDENT_AMBULATORY_CARE_PROVIDER_SITE_OTHER): Payer: 59 | Admitting: Addiction (Substance Use Disorder)

## 2019-09-04 DIAGNOSIS — F331 Major depressive disorder, recurrent, moderate: Secondary | ICD-10-CM | POA: Diagnosis not present

## 2019-09-04 NOTE — Progress Notes (Signed)
°      [facility] Counselor/Therapist Progress Note  Patient ID: KELLSEY SANSONE, MRN: [id number],    Date: 09/04/2019  Time Spent: 10:08-11:01 64mins  Treatment Type: Individual Therapy  Reported Symptoms: some shame, more motivation.  Mental Status Exam:  Appearance:   Neat     Behavior:  Appropriate and Motivated  Motor:  Normal  Speech/Language:   Clear and Coherent and Slow  Affect:  Appropriate  Mood:  anxious and sad  Thought process:  circumstantial, concrete and goal directed  Thought content:    Rumination  Sensory/Perceptual disturbances:    WNL  Orientation:  x4  Attention:  Good  Concentration:  Good  Memory:  WNL  Fund of knowledge:   Good  Insight:    Good  Judgment:   Good  Impulse Control:  Good   Risk Assessment: Danger to Self:  No Self-injurious Behavior: No Danger to Others: No Duty to Warn:no Physical Aggression / Violence:No  Access to Firearms a concern: No  Gang Involvement:No   Subjective: Client came in reporting having some shame from her depressive episodes and feeling "broken". Therapist used MI to support client in processing the painful emotions. 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 Therapist used CBT with client as she worked to validate client's feelings, while helping her to tease out what she is thinking she may need to hear from her husband in contrast from what she feels internally. Client processed areas of growth she wants to continue to work on that would help her writing. Client reports like shes siloing her spiritual practice (of loving kindness and compassion) from her personal life and writing short stories work. 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 Therapist encouraged the use of mindfulness to help client come back to the  present to ground in her body before doing her writing.   Interventions: Cognitive Behavioral Therapy, Mindfulness Meditation and Motivational Interviewing  Diagnosis:   ICD-10-CM   1. 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 Client is to continue seeing medication provider for support of mood management.   Client to engage in CBT: challenging negative internal ruminationsand self-talk AEB journaling daily or expressing thoughts to support persons in their life and then challenging it with truth.  Client to engage in tapping to tap in healthy cognition that challenges negative rumination or deep negative core belief.  Client to practice DBT distress tolerance skills to decrease crying spells and thoughts of not being able to endure their suffering AEB using mindfulness, deep breathing, and TIP to increase tolerance for discomfort, discharge emotional distress, and increase their understanding that they can do hard things.  Client to utilize BSP (brainspotting) with therapist to help client identify and process triggers for their depressive episode and anxiety with goal of reducing said SUDs caused by depression/anxiety by 33% in the next 6 months. Client to prioritize sleep 8+ hours each week night AEB going to bed by 10pm each night.  Barnie Del, LCSW, LCAS, CCTP, CCS-I, BSP

## 2019-09-10 ENCOUNTER — Ambulatory Visit: Payer: 59 | Admitting: Addiction (Substance Use Disorder)

## 2019-09-17 ENCOUNTER — Encounter: Payer: Self-pay | Admitting: Addiction (Substance Use Disorder)

## 2019-09-17 ENCOUNTER — Ambulatory Visit (INDEPENDENT_AMBULATORY_CARE_PROVIDER_SITE_OTHER): Payer: 59 | Admitting: Addiction (Substance Use Disorder)

## 2019-09-17 ENCOUNTER — Other Ambulatory Visit: Payer: Self-pay

## 2019-09-17 DIAGNOSIS — F411 Generalized anxiety disorder: Secondary | ICD-10-CM

## 2019-09-17 NOTE — Progress Notes (Signed)
      [facility] Counselor/Therapist Progress Note  Patient ID: Michaela Roman, MRN: [id number],    Date: 09/17/2019  Time Spent:  47mins  Treatment Type: Individual Therapy  Reported Symptoms: frustration with misogamy, frustration with dad & husband.   Mental Status Exam:  Appearance:   Casual and Neat     Behavior:  Appropriate and Motivated  Motor:  Normal  Speech/Language:   Clear and Coherent and Slow  Affect:  Appropriate  Mood:  anxious and irritable  Thought process:  circumstantial, concrete and goal directed  Thought content:    Rumination  Sensory/Perceptual disturbances:    WNL  Orientation:  x4  Attention:  Good  Concentration:  Good  Memory:  WNL  Fund of knowledge:   Good  Insight:    Good  Judgment:   Good  Impulse Control:  Good   Risk Assessment: Danger to Self:  No. Denied.  Self-injurious Behavior: No Danger to Others: No Duty to Warn:no Physical Aggression / Violence:No  Access to Firearms a concern: No  Gang Involvement:No   Subjective: Client came in sharing her  frustration with misogamy, frustration with dad & husband. Client processed some of the roots of her frustration along with the distress she feels in her body: in her chest & throat, in relation to the relationship with her dad. Client felt tightness a 6/10 there. Therapist used BSP with client to encourage the processing of those sensations. Client processed her frustration, guilt, expectations, and desires for her relationship with her dad. Client reported reduced SUDs of 2/10 and a hope to "see where things go" with her dad following the session. Therapist used MI & CBT with client to support her in processing while helping her to see more of her thoughts related to her feelings about her dad, to help her gain perspective of internalized thoughts about herself stemming from childhood with her dad.   Interventions: Cognitive Behavioral Therapy, Motivational Interviewing and  Brainspotting  Diagnosis:   ICD-10-CM   1. GAD (generalized anxiety disorder)  F41.1     Plan of Care: Client to return for weekly therapy with Sammuel Cooper, therapist, to review again in 6 months. Client is to continue seeing medication provider for support of mood management.   Client to engage in CBT: challenging negative internal ruminationsand self-talk AEB journaling daily or expressing thoughts to support persons in their life and then challenging it with truth.  Client to engage in tapping to tap in healthy cognition that challenges negative rumination or deep negative core belief.  Client to practice DBT distress tolerance skills to decrease crying spells and thoughts of not being able to endure their suffering AEB using mindfulness, deep breathing, and TIP to increase tolerance for discomfort, discharge emotional distress, and increase their understanding that they can do hard things.  Client to utilize BSP (brainspotting) with therapist to help client identify and process triggers for their depressive episode and anxiety with goal of reducing said SUDs caused by depression/anxiety by 33% in the next 6 months. Client to prioritize sleep 8+ hours each week night AEB going to bed by 10pm each night.  Barnie Del, LCSW, LCAS, CCTP, CCS-I, BSP

## 2019-10-04 ENCOUNTER — Other Ambulatory Visit: Payer: Self-pay | Admitting: Obstetrics and Gynecology

## 2019-10-04 ENCOUNTER — Other Ambulatory Visit: Payer: Self-pay

## 2019-10-04 ENCOUNTER — Ambulatory Visit (INDEPENDENT_AMBULATORY_CARE_PROVIDER_SITE_OTHER): Payer: 59 | Admitting: Addiction (Substance Use Disorder)

## 2019-10-04 ENCOUNTER — Encounter: Payer: Self-pay | Admitting: Addiction (Substance Use Disorder)

## 2019-10-04 DIAGNOSIS — F411 Generalized anxiety disorder: Secondary | ICD-10-CM

## 2019-10-04 DIAGNOSIS — E28319 Asymptomatic premature menopause: Secondary | ICD-10-CM

## 2019-10-04 NOTE — Progress Notes (Signed)
      [facility] Counselor/Therapist Progress Note  Patient ID: Michaela Roman, MRN: [id number],    Date: 10/04/2019  Time Spent:  54 mins  Treatment Type: Individual Therapy  Reported Symptoms: feeling flooded/anxious, overwhelmed.   Mental Status Exam:  Appearance:   Neat and Well Groomed     Behavior:  Appropriate and Motivated  Motor:  Normal  Speech/Language:   Clear and Coherent and Normal Rate  Affect:  Appropriate  Mood:  anxious and sad  Thought process:  normal  Thought content:    Rumination  Sensory/Perceptual disturbances:    WNL  Orientation:  x4  Attention:  Good  Concentration:  Good  Memory:  WNL  Fund of knowledge:   Good  Insight:    Good  Judgment:   Good  Impulse Control:  Good   Risk Assessment: Danger to Self:  No. Denied.  Self-injurious Behavior: No Danger to Others: No Duty to Warn:no Physical Aggression / Violence:No  Access to Firearms a concern: No  Gang Involvement:No   Subjective: Client came in sharing her frustration with feeling like men "take take take" from her and women. Client reporting distress in her marriage as a result and feeling distrust of her husband as a result and a desire to process it. Therapist used MI & BSP with client to process. Client reported a SUDs of 5/10, in her chest back, and brow: feeling a crushing, numb, protective, angry, tightness. Client processed the emotional flooding & feelings of overwhelm. Client made progress processing and her SUDs dropped to a 1/10 and felt grounded in her pelvis. Client made progress but continuing to explore where she can practice noticing the most resourced body spot when flooded and practice coping and journaling her emotions to help express them.  Interventions: Motivational Interviewing and Brainspotting  Diagnosis:   ICD-10-CM   1. GAD (generalized anxiety disorder)  F41.1     Plan of Care: Client to return for weekly therapy with Sammuel Cooper, therapist, to review  again in 6 months. Client is to continue seeing medication provider for support of mood management.   Client to engage in CBT: challenging negative internal ruminationsand self-talk AEB journaling daily or expressing thoughts to support persons in their life and then challenging it with truth.  Client to engage in tapping to tap in healthy cognition that challenges negative rumination or deep negative core belief.  Client to practice DBT distress tolerance skills to decrease crying spells and thoughts of not being able to endure their suffering AEB using mindfulness, deep breathing, and TIP to increase tolerance for discomfort, discharge emotional distress, and increase their understanding that they can do hard things.  Client to utilize BSP (brainspotting) with therapist to help client identify and process triggers for their depressive episode and anxiety with goal of reducing said SUDs caused by depression/anxiety by 33% in the next 6 months. Client to prioritize sleep 8+ hours each week night AEB going to bed by 10pm each night.  Barnie Del, LCSW, LCAS, CCTP, CCS-I, BSP

## 2019-10-04 NOTE — Telephone Encounter (Signed)
Medication refill request: Dotti 0.05mg /24hr patch Last AEX:  10/18/19 Next AEX: 10/18/19 Last MMG (if hormonal medication request): 08/27/19  Normal  Refill authorized: 24/0

## 2019-10-11 ENCOUNTER — Ambulatory Visit: Payer: 59 | Admitting: Addiction (Substance Use Disorder)

## 2019-10-16 ENCOUNTER — Ambulatory Visit (INDEPENDENT_AMBULATORY_CARE_PROVIDER_SITE_OTHER): Payer: 59 | Admitting: Addiction (Substance Use Disorder)

## 2019-10-16 ENCOUNTER — Encounter: Payer: Self-pay | Admitting: Addiction (Substance Use Disorder)

## 2019-10-16 ENCOUNTER — Other Ambulatory Visit: Payer: Self-pay

## 2019-10-16 DIAGNOSIS — F411 Generalized anxiety disorder: Secondary | ICD-10-CM | POA: Diagnosis not present

## 2019-10-16 NOTE — Progress Notes (Signed)
      [facility] Counselor/Therapist Progress Note  Patient ID: Michaela Roman, MRN: [id number],    Date: 10/16/2019  Time Spent:  54 mins  Treatment Type: Individual Therapy  Reported Symptoms: nervous but excited.  Mental Status Exam:  Appearance:   Neat and Well Groomed     Behavior:  Appropriate and Motivated  Motor:  Normal  Speech/Language:   Clear and Coherent and Normal Rate  Affect:  Appropriate  Mood:  anxious and sad  Thought process:  normal  Thought content:    Rumination  Sensory/Perceptual disturbances:    WNL  Orientation:  x4  Attention:  Good  Concentration:  Good  Memory:  WNL  Fund of knowledge:   Good  Insight:    Good  Judgment:   Good  Impulse Control:  Good   Risk Assessment: Danger to Self:  No. Denied.  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 Client processed the feelings of being ignored or not respected by men at her work and in her personal life: her dad and husband. Therapist used MI & mindfulness with client to help affirm her frustration and support her in processing the frustration to alleviate her irritation/bitterness holding her back from engaging in the relationships she cares about with her dad/husband. Therapist led client in grounding exercise and assessed for depression/mh and client denied SI/HI/AVH.  Interventions: Mindfulness Meditation and Motivational Interviewing  Diagnosis:   ICD-10-CM   1. GAD (generalized anxiety disorder)  F41.1    Plan of Care: Client to return for weekly therapy with Sammuel Cooper, therapist, to review again in 6 months. Client is to continue seeing medication provider for support of mood management.   Client to engage in CBT: challenging negative  internal ruminationsand self-talk AEB journaling daily or expressing thoughts to support persons in their life and then challenging it with truth.  Client to engage in tapping to tap in healthy cognition that challenges negative rumination or deep negative core belief.  Client to practice DBT distress tolerance skills to decrease crying spells and thoughts of not being able to endure their suffering AEB using mindfulness, deep breathing, and TIP to increase tolerance for discomfort, discharge emotional distress, and increase their understanding that they can do hard things.  Client to utilize BSP (brainspotting) with therapist to help client identify and process triggers for their depressive episode and anxiety with goal of reducing said SUDs caused by depression/anxiety by 33% in the next 6 months. Client to prioritize sleep 8+ hours each week night AEB going to bed by 10pm each night.  Barnie Del, LCSW, LCAS, CCTP, CCS-I, BSP

## 2019-10-18 ENCOUNTER — Other Ambulatory Visit: Payer: Self-pay

## 2019-10-18 ENCOUNTER — Ambulatory Visit (INDEPENDENT_AMBULATORY_CARE_PROVIDER_SITE_OTHER): Payer: 59 | Admitting: Psychiatry

## 2019-10-18 ENCOUNTER — Ambulatory Visit (INDEPENDENT_AMBULATORY_CARE_PROVIDER_SITE_OTHER): Payer: 59 | Admitting: Obstetrics and Gynecology

## 2019-10-18 ENCOUNTER — Encounter: Payer: Self-pay | Admitting: Obstetrics and Gynecology

## 2019-10-18 ENCOUNTER — Encounter: Payer: Self-pay | Admitting: Psychiatry

## 2019-10-18 VITALS — BP 104/64 | HR 60 | Resp 20 | Ht 62.5 in | Wt 155.0 lb

## 2019-10-18 DIAGNOSIS — F330 Major depressive disorder, recurrent, mild: Secondary | ICD-10-CM

## 2019-10-18 DIAGNOSIS — E28319 Asymptomatic premature menopause: Secondary | ICD-10-CM | POA: Diagnosis not present

## 2019-10-18 DIAGNOSIS — F411 Generalized anxiety disorder: Secondary | ICD-10-CM | POA: Diagnosis not present

## 2019-10-18 DIAGNOSIS — E039 Hypothyroidism, unspecified: Secondary | ICD-10-CM

## 2019-10-18 DIAGNOSIS — Z01419 Encounter for gynecological examination (general) (routine) without abnormal findings: Secondary | ICD-10-CM | POA: Diagnosis not present

## 2019-10-18 MED ORDER — ESCITALOPRAM OXALATE 5 MG PO TABS: 5.0000 mg | ORAL_TABLET | Freq: Every day | ORAL | 1 refills | Status: DC

## 2019-10-18 MED ORDER — ESTRADIOL 0.05 MG/24HR TD PTTW: MEDICATED_PATCH | TRANSDERMAL | 3 refills | Status: DC

## 2019-10-18 MED ORDER — MEDROXYPROGESTERONE ACETATE 5 MG PO TABS
5.0000 mg | ORAL_TABLET | Freq: Every day | ORAL | 3 refills | Status: DC
Start: 2019-10-18 — End: 2020-02-13

## 2019-10-18 NOTE — Patient Instructions (Signed)

## 2019-10-18 NOTE — Progress Notes (Signed)
49 y.o. G45P0000 Married Caucasian female here for annual exam.    Taking Lexapro, which is working well.  Doing counseling.   Sleep is good.  Energy level is generally good.  No hot flashes.  Wants to continue her HRT.  Denies vaginal bleeding.   PCP:   Will go to [facility] [place].   Patient's last menstrual period was 08/07/2015 (approximate).           Sexually active: Yes.    The current method of family planning is post menopausal status.    Exercising: Yes.    HIT cardio, strength, pilates 5-6 days/week Smoker:  no  Health Maintenance: Pap:  06-24-17 Neg:Neg HR HPV History of abnormal Pap:  no MMG: 08-27-19 3D/Neg/density B/BiRads1 Colonoscopy:  NEVER--did stool kit 09-26-17 which was negative BMD:  01/04/19 - osteopenia. TDaP: 08-22-15 Gardasil:   no HIV: years ago Neg Hep C:never Screening Labs:  PCP.   reports that she has never smoked. She has never used smokeless tobacco. She reports current alcohol use of about 4.0 standard drinks of alcohol per week. She reports that she does not use drugs.  Past Medical History:  Diagnosis Date  . Anxiety   . Depression   . Hormone disorder   . Hypothyroidism   . Premature ovarian failure    Dx'd age 65  . Thyroid disease    hypothyroid    Past Surgical History:  Procedure Laterality Date  . DILATATION & CURETTAGE/HYSTEROSCOPY WITH MYOSURE N/A 10/07/2015   Procedure: DILATATION & CURETTAGE/HYSTEROSCOPY WITH MYOSURE;  Surgeon: Nunzio Cobbs, MD;  Location: [facility];  Service: Gynecology;  Laterality: N/A;  endometrial polyp  . WISDOM TOOTH EXTRACTION      Current Outpatient Medications  Medication Sig Dispense Refill  . calcium gluconate 500 MG tablet Take 1 tablet by mouth daily.    Marland Kitchen escitalopram (LEXAPRO) 5 MG tablet Take 1 tablet (5 mg total) by mouth daily. 90 tablet 1  . estradiol (DOTTI) 0.05 MG/24HR patch APPLY 1 PATCH TOPICALLY TWICE A WEEK 24 patch 3  . levothyroxine (SYNTHROID) 112 MCG tablet TAKE  1 TABLET BY MOUTH ONCE DAILY BEFORE BREAKFAST 90 tablet 3  . medroxyPROGESTERone (PROVERA) 5 MG tablet Take 1 tablet (5 mg total) by mouth daily. 90 tablet 3  . Multiple Vitamins-Minerals (MULTIVITAMIN PO) Take 1 tablet by mouth daily.     No current facility-administered medications for this visit.    Family History  Problem Relation Age of Onset  . Thyroid disease Sister        hypothyroidism  . Hypertension Maternal Grandmother   . Heart disease Maternal Grandmother   . Depression Maternal Grandmother   . Diabetes Maternal Grandfather   . Other Maternal Grandfather        benign brain tumor  . Depression Mother   . Multiple sclerosis Father   . Dementia Paternal Grandmother     Review of Systems  All other systems reviewed and are negative.   Exam:   BP 104/64   Pulse 60   Resp 20   Ht 5' 2.5" (1.588 m)   Wt 155 lb (70.3 kg)   LMP 08/07/2015 (Approximate)   BMI 27.90 kg/m     General appearance: alert, cooperative and appears stated age Head: normocephalic, without obvious abnormality, atraumatic Neck: no adenopathy, supple, symmetrical, trachea midline and thyroid normal to inspection and palpation Lungs: clear to auscultation bilaterally Breasts: normal appearance, no masses or tenderness, No nipple retraction or dimpling,  No nipple discharge or bleeding, No axillary adenopathy Heart: regular rate and rhythm Abdomen: soft, non-tender; no masses, no organomegaly Extremities: extremities normal, atraumatic, no cyanosis or edema Skin: skin color, texture, turgor normal. No rashes or lesions Lymph nodes: cervical, supraclavicular, and axillary nodes normal. Neurologic: grossly normal  Pelvic: External genitalia:  no lesions              No abnormal inguinal nodes palpated.              Urethra:  normal appearing urethra with no masses, tenderness or lesions              Bartholins and Skenes: normal                 Vagina: normal appearing vagina with normal color  and discharge, no lesions              Cervix: no lesions              Pap taken: No. Bimanual Exam:  Uterus:  normal size, contour, position, consistency, mobility, non-tender              Adnexa: no mass, fullness, tenderness              Rectal exam: Yes.  .  Confirms.              Anus:  normal sphincter tone, no lesions  Chaperone was present for exam.  Assessment:   Well woman visit with normal exam. Premature ovarian failure - insufficiency.  HRT patient. Hypothyroidism.  Osteopenia.   Plan: Mammogram screening discussed. Self breast awareness reviewed. Pap and HR HPV 2024.  Guidelines for Calcium, Vitamin D, regular exercise program including cardiovascular and weight bearing exercise. Discused WHI and use of HRT which can increase risk of PE, DVT, MI, stroke and breast cancer.  Refill of HRT for one year.  Check TFTs today and will then prescribe her Synthroid. Rx will go to [facility].  Her remaining labs she will do with her new PCP. Follow up annually and prn.   After visit summary provided.

## 2019-10-18 NOTE — Progress Notes (Signed)
Michaela Roman [id number] Apr 26, 1970 49 y.o.  Subjective:   Patient ID:  Michaela Roman is a 49 y.o. (DOB 1971/02/22) female.  Chief Complaint:  Chief Complaint  Patient presents with  . Follow-up    h/o Depression and anxiety    HPI Michaela Roman presents to the office today for follow-up of depression and anxiety. She reports that her mood has been "pretty good." Had a good visit with family last month. She reports that she feels "more level." She reports that now when she experiences sadness or anger she is able to identify a trigger and that she is not having persistent depression. She reports that her anxiety has been manageable with using techniques such as meditation, grounding exercises, etc. Sleep has been consistent. Appetite has been good. Energy has been slightly lower over the last few weeks. Has had to be intentional with motivating herself and setting goals. Reports that she is able to accomplish what she needs to. Notices some distractibility. Denies anhedonia. Has been enjoying outdoor pizza oven and having people over. Denies SI.   Father had to have an unexpected foot amputation. She and her husband will be celebrating their 15th anniversary next month. Friends came to visit from [place].   Past Psychiatric Medication Trials: Wellbutrin- Insomnia, racing thoughts Stopped after 2 weeks.  Zoloft- Took for a few weeks. Had insomnia. Developed hives. Lexapro    Review of Systems:  Review of Systems  Gastrointestinal: Negative.   Musculoskeletal: Negative for gait problem.  Neurological: Negative for tremors and headaches.  Psychiatric/Behavioral:       Please refer to HPI    Medications: I have reviewed the patient's current medications.  Current Outpatient Medications  Medication Sig Dispense Refill  . calcium gluconate 500 MG tablet Take 1 tablet by mouth daily.    . DOTTI 0.05 MG/24HR patch APPLY 1 PATCH TOPICALLY TWICE A WEEK 8  patch 0  . levothyroxine (SYNTHROID) 112 MCG tablet TAKE 1 TABLET BY MOUTH ONCE DAILY BEFORE BREAKFAST 90 tablet 3  . medroxyPROGESTERone (PROVERA) 5 MG tablet Take 1 tablet (5 mg total) by mouth daily. 90 tablet 3  . Multiple Vitamins-Minerals (MULTIVITAMIN PO) Take 1 tablet by mouth daily.    Marland Kitchen escitalopram (LEXAPRO) 5 MG tablet Take 1 tablet (5 mg total) by mouth daily. 90 tablet 1   No current facility-administered medications for this visit.    Medication Side Effects: None  Allergies:  Allergies  Allergen Reactions  . Sertraline Hives    Past Medical History:  Diagnosis Date  . Anxiety    no meds  . Depression    no meds  . Hormone disorder   . Hypothyroidism   . Premature ovarian failure    Dx'd age 49  . Thyroid disease    hypothyroid    Family History  Problem Relation Age of Onset  . Thyroid disease Sister        hypothyroidism  . Hypertension Maternal Grandmother   . Heart disease Maternal Grandmother   . Depression Maternal Grandmother   . Diabetes Maternal Grandfather   . Other Maternal Grandfather        benign brain tumor  . Depression Mother   . Multiple sclerosis Father   . Dementia Paternal Grandmother     Social History   Socioeconomic History  . Marital status: Married    Spouse name: Not on file  . Number of children: Not on file  . Years of education: Not on file  .  Highest education level: Not on file  Occupational History  . Not on file  Tobacco Use  . Smoking status: Never Smoker  . Smokeless tobacco: Never Used  Vaping Use  . Vaping Use: Never used  Substance and Sexual Activity  . Alcohol use: Yes    Alcohol/week: 4.0 standard drinks    Types: 2 Glasses of wine, 2 Standard drinks or equivalent per week    Comment: one drink a week  . Drug use: No  . Sexual activity: Yes    Partners: Female, Female    Birth control/protection: None, Post-menopausal  Other Topics Concern  . Not on file  Social History Narrative  . Not on  file   Social Determinants of Health   Financial Resource Strain:   . Difficulty of Paying Living Expenses: Not on file  Food Insecurity:   . Worried About Charity fundraiser in the Last Year: Not on file  . Ran Out of Food in the Last Year: Not on file  Transportation Needs:   . Lack of Transportation (Medical): Not on file  . Lack of Transportation (Non-Medical): Not on file  Physical Activity:   . Days of Exercise per Week: Not on file  . Minutes of Exercise per Session: Not on file  Stress:   . Feeling of Stress : Not on file  Social Connections:   . Frequency of Communication with Friends and Family: Not on file  . Frequency of Social Gatherings with Friends and Family: Not on file  . Attends Religious Services: Not on file  . Active Member of Clubs or Organizations: Not on file  . Attends Archivist Meetings: Not on file  . Marital Status: Not on file  Intimate Partner Violence:   . Fear of Current or Ex-Partner: Not on file  . Emotionally Abused: Not on file  . Physically Abused: Not on file  . Sexually Abused: Not on file    Past Medical History, Surgical history, Social history, and Family history were reviewed and updated as appropriate.   Please see review of systems for further details on the patient's review from today.   Objective:   Physical Exam:  LMP 08/07/2015 (Approximate)   Physical Exam Constitutional:      General: She is not in acute distress. Musculoskeletal:        General: No deformity.  Neurological:     Mental Status: She is alert and oriented to person, place, and time.     Coordination: Coordination normal.  Psychiatric:        Attention and Perception: Attention and perception normal. She does not perceive auditory or visual hallucinations.        Mood and Affect: Mood normal. Mood is not anxious or depressed. Affect is not labile, blunt, angry or inappropriate.        Speech: Speech normal.        Behavior: Behavior  normal.        Thought Content: Thought content normal. Thought content is not paranoid or delusional. Thought content does not include homicidal or suicidal ideation. Thought content does not include homicidal or suicidal plan.        Cognition and Memory: Cognition and memory normal.        Judgment: Judgment normal.     Comments: Insight intact     Lab Review:     Component Value Date/Time   NA 140 10/17/2018 0922   K 4.2 10/17/2018 0922   CL 103  10/17/2018 0922   CO2 25 10/17/2018 0922   GLUCOSE 81 10/17/2018 0922   GLUCOSE 85 08/22/2015 1653   BUN 12 10/17/2018 0922   CREATININE 0.97 10/17/2018 0922   CREATININE 0.95 08/22/2015 1653   CALCIUM 8.9 10/17/2018 0922   PROT 6.5 10/17/2018 0922   ALBUMIN 4.5 10/17/2018 0922   AST 20 10/17/2018 0922   ALT 16 10/17/2018 0922   ALKPHOS 41 10/17/2018 0922   BILITOT 0.7 10/17/2018 0922   GFRNONAA 70 10/17/2018 0922   GFRAA 81 10/17/2018 0922       Component Value Date/Time   WBC 4.7 10/17/2018 0922   WBC 5.4 10/07/2015 0620   RBC 4.40 10/17/2018 0922   RBC 4.21 10/07/2015 0620   HGB 13.5 10/17/2018 0922   HCT 40.9 10/17/2018 0922   PLT 188 10/17/2018 0922   MCV 93 10/17/2018 0922   MCH 30.7 10/17/2018 0922   MCH 31.6 10/07/2015 0620   MCHC 33.0 10/17/2018 0922   MCHC 35.3 10/07/2015 0620   RDW 13.1 10/17/2018 0922    No results found for: POCLITH, LITHIUM   No results found for: PHENYTOIN, PHENOBARB, VALPROATE, CBMZ   .res Assessment: Plan:   Will continue Lexapro 5 mg po qd for depression and anxiety since pt reports that signs and symptoms are currently well controlled without any tolerability issues. Recommend continuing therapy with Sammuel Cooper, LCSW. Pt to f/u in 6 months or sooner if clinically indicated.  Patient advised to contact office with any questions, adverse effects, or acute worsening in signs and symptoms.  Michaela Roman was seen today for follow-up.  Diagnoses and all orders for this visit:  GAD  (generalized anxiety disorder) -     escitalopram (LEXAPRO) 5 MG tablet; Take 1 tablet (5 mg total) by mouth daily.  Mild episode of recurrent major depressive disorder (HCC) -     escitalopram (LEXAPRO) 5 MG tablet; Take 1 tablet (5 mg total) by mouth daily.     Please see After Visit Summary for patient specific instructions.  Future Appointments  Date Time Provider [place]  10/18/2019  3:00 PM Nunzio Cobbs, MD [facility] None  10/23/2019  9:00 AM Barnie Del, LCSW [facility] None  10/30/2019 10:00 AM Barnie Del, LCSW [facility] None  11/06/2019  9:00 AM Barnie Del, LCSW [facility] None  11/13/2019  9:00 AM Barnie Del, LCSW [facility] None  11/20/2019 11:00 AM Barnie Del, LCSW [facility] None  11/27/2019  8:00 AM Barnie Del, LCSW [facility] None  04/15/2020  8:30 AM Thayer Headings, PMHNP [facility] None    No orders of the defined types were placed in this encounter.   -------------------------------

## 2019-10-19 ENCOUNTER — Other Ambulatory Visit: Payer: Self-pay | Admitting: Obstetrics and Gynecology

## 2019-10-19 LAB — TSH: TSH: 2.09 u[IU]/mL (ref 0.450–4.500)

## 2019-10-19 LAB — T4, FREE: Free T4: 1.28 ng/dL (ref 0.82–1.77)

## 2019-10-19 MED ORDER — LEVOTHYROXINE SODIUM 112 MCG PO TABS
ORAL_TABLET | ORAL | 3 refills | Status: DC
Start: 2019-10-19 — End: 2020-08-12

## 2019-10-22 ENCOUNTER — Telehealth: Payer: Self-pay | Admitting: Addiction (Substance Use Disorder)

## 2019-10-22 NOTE — Telephone Encounter (Signed)
I assume you are aware of her allergy to zoloft

## 2019-10-22 NOTE — Telephone Encounter (Signed)
[employer] called to inform pt is allergic to Zoloft. They needed [place] to know that Lexapro has a similar allergy. Ok to fill Lexapro? [phone] #3 option

## 2019-10-23 ENCOUNTER — Ambulatory Visit (INDEPENDENT_AMBULATORY_CARE_PROVIDER_SITE_OTHER): Payer: 59 | Admitting: Addiction (Substance Use Disorder)

## 2019-10-23 ENCOUNTER — Encounter: Payer: Self-pay | Admitting: Addiction (Substance Use Disorder)

## 2019-10-23 ENCOUNTER — Other Ambulatory Visit: Payer: Self-pay

## 2019-10-23 DIAGNOSIS — F411 Generalized anxiety disorder: Secondary | ICD-10-CM | POA: Diagnosis not present

## 2019-10-23 NOTE — Progress Notes (Signed)
[facility] Counselor/Therapist Progress Note  Patient ID: Michaela Roman, MRN: [id number],    Date: 10/23/2019  Time Spent:  55 mins  Treatment Type: Individual Therapy  Reported Symptoms: feeling unsure, lots of thoughts.   Mental Status Exam:  Appearance:   Casual and Neat     Behavior:  Appropriate and Motivated  Motor:  Normal  Speech/Language:   Clear and Coherent and Normal Rate  Affect:  Appropriate  Mood:  normal  Thought process:  normal  Thought content:    Rumination  Sensory/Perceptual disturbances:    WNL  Orientation:  x4  Attention:  Good  Concentration:  Good  Memory:  WNL  Fund of knowledge:   Good  Insight:    Good  Judgment:   Good  Impulse Control:  Good   Risk Assessment: Danger to Self:  No. Denied.  Self-injurious Behavior: No Danger to Others: No Duty to Warn:no Physical Aggression / Violence:No  Access to Firearms a concern: No  Gang Involvement:No   Subjective: Client reported feeling unsure about what to do with her time: work with her husband or in her own lane, and is having lots of intrusive thoughts. 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 Therapist used MI & CBT with client to affirm her fears but provide affirmations for her strengths and courage and help challenge the client's irrational distressed fears. Client still struggling to consider helping her husband on his new venture with a new restaurant but struggles with extreme thoughts at times such as running from him completely to keep from him "overshadowing her or her work". Client struggles with identity and feels like she is always living in the shadows of a female in her life. Therapist explored this further with client, looking for triggers but also helping client begin to recognize ways to calm her intrusive thoughts and false alarms. Therapist taught client how  to identify the false alarms in her thoughts that they discussed in session and client made progress. Therapist assessed for safety and client denied SI/HI/AVH.  Interventions: Cognitive Behavioral Therapy and Motivational Interviewing  Diagnosis:   ICD-10-CM   1. GAD (generalized anxiety disorder)  F41.1    Plan of Care: Client to return for weekly therapy with Sammuel Cooper, therapist, to review again in 6 months. Client is to continue seeing medication provider for support of mood management.   Client to engage in CBT: challenging negative internal ruminationsand self-talk AEB journaling daily or expressing thoughts to support persons in their life and then challenging it with truth.  Client to engage in tapping to tap in healthy cognition that challenges negative rumination or deep negative core belief.  Client to practice DBT distress tolerance skills to decrease crying spells and thoughts of not being able to endure their suffering AEB using mindfulness, deep breathing, and TIP to increase tolerance for discomfort, discharge emotional distress, and increase their understanding that they can do hard things.  Client to utilize BSP (brainspotting) with therapist to help client identify and process triggers for their depressive episode and anxiety with goal of reducing said SUDs caused by depression/anxiety by 33% in the next 6 months. Client to prioritize sleep 8+ hours each week night AEB going to bed by 10pm each night.  Barnie Del, LCSW, LCAS, CCTP, CCS-I, BSP

## 2019-10-23 NOTE — Telephone Encounter (Signed)
Spoke with [facility] and they made note.

## 2019-10-30 ENCOUNTER — Encounter: Payer: Self-pay | Admitting: Addiction (Substance Use Disorder)

## 2019-10-30 ENCOUNTER — Other Ambulatory Visit: Payer: Self-pay

## 2019-10-30 ENCOUNTER — Ambulatory Visit (INDEPENDENT_AMBULATORY_CARE_PROVIDER_SITE_OTHER): Payer: 59 | Admitting: Addiction (Substance Use Disorder)

## 2019-10-30 DIAGNOSIS — F411 Generalized anxiety disorder: Secondary | ICD-10-CM | POA: Diagnosis not present

## 2019-10-30 NOTE — Progress Notes (Signed)
      [facility] Counselor/Therapist Progress Note  Patient ID: Michaela Roman, MRN: [id number],    Date: 10/30/2019  Time Spent:  71mins  Treatment Type: Individual Therapy  Reported Symptoms: contemplating tapering depression meds; less depression, disassociation.   Mental Status Exam:  Appearance:   Casual and Neat     Behavior:  Appropriate and Motivated  Motor:  Normal  Speech/Language:   Clear and Coherent and Normal Rate  Affect:  Appropriate  Mood:  anxious  Thought process:  normal  Thought content:    Rumination  Sensory/Perceptual disturbances:    WNL  Orientation:  x4  Attention:  Good  Concentration:  Good  Memory:  WNL  Fund of knowledge:   Good  Insight:    Good  Judgment:   Good  Impulse Control:  Good   Risk Assessment: Danger to Self:  No. Denied.  Self-injurious Behavior: No Danger to Others: No Duty to Warn:no Physical Aggression / Violence:No  Access to Firearms a concern: No  Gang Involvement:No   Subjective: Client reported contemplating tapering depression meds since having less depression and her consideration of taking care of herself and her marriage to help her feel more stabilized in her relationship and with herself. 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 Client concerned about their relationship since they are coming up on a big anniversary. Client reported the dynamic with her husband making her feel "useless, naked and exposed", but wanting to feel "solid and substantial" in her connection with him and in her body. Therapist used BSP- double spotting bullseye technique to help client process the tension in her upper back that connect to her feeling exposed leading her to think: "shes stupid, not quick & less than". Client made progress reducing the activation in her chest (from 8/10 to 2/10 SUDs) of the  naked, exposed feeling. Client made progress and was able to the feel the dialectics of both the exposure and the solidness, joining the activation and resourced spot. Therapist assessed for safety and client denied SI/HI/AVH.  Interventions: Cognitive Behavioral Therapy and Motivational Interviewing  Diagnosis:   ICD-10-CM   1. GAD (generalized anxiety disorder)  F41.1    Plan of Care: Client to return for weekly therapy with Sammuel Cooper, therapist, to review again in 6 months. Client is to continue seeing medication provider for support of mood management.   Client to engage in CBT: challenging negative internal ruminationsand self-talk AEB journaling daily or expressing thoughts to support persons in their life and then challenging it with truth.  Client to engage in tapping to tap in healthy cognition that challenges negative rumination or deep negative core belief.  Client to practice DBT distress tolerance skills to decrease crying spells and thoughts of not being able to endure their suffering AEB using mindfulness, deep breathing, and TIP to increase tolerance for discomfort, discharge emotional distress, and increase their understanding that they can do hard things.  Client to utilize BSP (brainspotting) with therapist to help client identify and process triggers for their depressive episode and anxiety with goal of reducing said SUDs caused by depression/anxiety by 33% in the next 6 months. Client to prioritize sleep 8+ hours each week night AEB going to bed by 10pm each night.  Barnie Del, LCSW, LCAS, CCTP, CCS-I, BSP

## 2019-11-06 ENCOUNTER — Ambulatory Visit: Payer: 59 | Admitting: Addiction (Substance Use Disorder)

## 2019-11-13 ENCOUNTER — Ambulatory Visit (INDEPENDENT_AMBULATORY_CARE_PROVIDER_SITE_OTHER): Payer: 59 | Admitting: Addiction (Substance Use Disorder)

## 2019-11-13 ENCOUNTER — Encounter: Payer: Self-pay | Admitting: Addiction (Substance Use Disorder)

## 2019-11-13 ENCOUNTER — Other Ambulatory Visit: Payer: Self-pay

## 2019-11-13 DIAGNOSIS — F411 Generalized anxiety disorder: Secondary | ICD-10-CM

## 2019-11-13 NOTE — Progress Notes (Signed)
      [facility] Counselor/Therapist Progress Note  Patient ID: Michaela Roman, MRN: [id number],    Date: 11/13/2019  Time Spent:  78mins  Treatment Type: Individual Therapy  Reported Symptoms: frustrated with her writing blocks and feeling disassociated/disconnected from them, some depressed/hopeless thoughts.   Mental Status Exam:  Appearance:   Casual and Neat     Behavior:  Appropriate and Motivated  Motor:  Normal  Speech/Language:   Clear and Coherent and Normal Rate  Affect:  Appropriate  Mood:  depressed and irritable  Thought process:  normal  Thought content:    Rumination  Sensory/Perceptual disturbances:    WNL  Orientation:  x4  Attention:  Good  Concentration:  Good  Memory:  WNL  Fund of knowledge:   Good  Insight:    Good  Judgment:   Good  Impulse Control:  Good   Risk Assessment: Danger to Self:  No. Denied.  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 Client feeling superior to the character shes playing, like she cant relate to their struggles. Client identified feeling it in her head - as a 5 out of 10 and wants it to reduce because she said that superior feeling feels false, like false confidence. Therapist used MI & psychoeducation to validate client's struggle to be vulnerable and normalize her struggle. Therapist also used BSP with client to help her find a body resource where she remembers feeling confident being vulnerable and showing her true self through the characters. Client made progress reducing the activation in her head (from 5/10 to 2/10 SUDs) connected to false confidence and more vulnerability and real confidence in her feet. Client  Reported a plan to go home and write more from her feet- where shes most confident. Therapist assessed  for safety and client denied SI/HI/AVH.  Interventions: Motivational Interviewing, Psycho-education/Bibliotherapy and bsp  Diagnosis:   ICD-10-CM   1. GAD (generalized anxiety disorder)  F41.1     Plan of Care: Client to return for weekly therapy with Sammuel Cooper, therapist, to review again in 6 months. Client is to continue seeing medication provider for support of mood management.   Client to engage in CBT: challenging negative internal ruminationsand self-talk AEB journaling daily or expressing thoughts to support persons in their life and then challenging it with truth.  Client to engage in tapping to tap in healthy cognition that challenges negative rumination or deep negative core belief.  Client to practice DBT distress tolerance skills to decrease crying spells and thoughts of not being able to endure their suffering AEB using mindfulness, deep breathing, and TIP to increase tolerance for discomfort, discharge emotional distress, and increase their understanding that they can do hard things.  Client to utilize BSP (brainspotting) with therapist to help client identify and process triggers for their depressive episode and anxiety with goal of reducing said SUDs caused by depression/anxiety by 33% in the next 6 months. Client to prioritize sleep 8+ hours each week night AEB going to bed by 10pm each night.  Barnie Del, LCSW, LCAS, CCTP, CCS-I, BSP

## 2019-11-20 ENCOUNTER — Ambulatory Visit (INDEPENDENT_AMBULATORY_CARE_PROVIDER_SITE_OTHER): Payer: 59 | Admitting: Addiction (Substance Use Disorder)

## 2019-11-20 ENCOUNTER — Encounter: Payer: Self-pay | Admitting: Addiction (Substance Use Disorder)

## 2019-11-20 ENCOUNTER — Other Ambulatory Visit: Payer: Self-pay

## 2019-11-20 DIAGNOSIS — F411 Generalized anxiety disorder: Secondary | ICD-10-CM

## 2019-11-20 NOTE — Progress Notes (Signed)
      [facility] Counselor/Therapist Progress Note  Patient ID: Michaela Roman, MRN: [id number],    Date: 11/20/2019  Time Spent:  82mins  Treatment Type: Individual Therapy  Reported Symptoms: creative, positive.  Mental Status Exam:  Appearance:   Casual and Neat     Behavior:  Appropriate and Motivated  Motor:  Normal  Speech/Language:   Clear and Coherent and Normal Rate  Affect:  Appropriate  Mood:  normal  Thought process:  normal  Thought content:    Rumination  Sensory/Perceptual disturbances:    WNL  Orientation:  x4  Attention:  Good  Concentration:  Good  Memory:  WNL  Fund of knowledge:   Good  Insight:    Good  Judgment:   Good  Impulse Control:  Good   Risk Assessment: Danger to Self:  No. Denied.  Self-injurious Behavior: No Danger to Others: No Duty to Warn:no Physical Aggression / Violence:No  Access to Firearms a concern: No  Gang Involvement:No   Subjective: Client reported feeling more optimistic about her writing abilities since the last session when she brainspotted writing blocks that keep her creative flow from coming to her. Client shared more about her desires to write in a way that identifies more with her character's struggles/etc. Therapist used MI & Narrative therapy to support client and explore more of her story that she most identifies with. Client identified more of her heritage and the storyline behind it, expressing a kind of confidence she feels when writing about it and planing to share it all with the world through her book one day. Therapist encouraged client to continue writing and believing in her abilities and her truth. Therapist assessed for safety and client denied SI/HI/AVH.  Interventions: Motivational Interviewing, Narrative and Family Systems  Diagnosis:   ICD-10-CM   1. GAD (generalized anxiety disorder)  F41.1      Plan of Care: Client to return for weekly therapy with Sammuel Cooper, therapist, to review again  in 6 months. Client is to continue seeing medication provider for support of mood management.   Client to engage in CBT: challenging negative internal ruminationsand self-talk AEB journaling daily or expressing thoughts to support persons in their life and then challenging it with truth.  Client to engage in tapping to tap in healthy cognition that challenges negative rumination or deep negative core belief.  Client to practice DBT distress tolerance skills to decrease crying spells and thoughts of not being able to endure their suffering AEB using mindfulness, deep breathing, and TIP to increase tolerance for discomfort, discharge emotional distress, and increase their understanding that they can do hard things.  Client to utilize BSP (brainspotting) with therapist to help client identify and process triggers for their depressive episode and anxiety with goal of reducing said SUDs caused by depression/anxiety by 33% in the next 6 months. Client to prioritize sleep 8+ hours each week night AEB going to bed by 10pm each night.  Michaela Del, LCSW, LCAS, CCTP, CCS-I, BSP

## 2019-11-27 ENCOUNTER — Encounter: Payer: Self-pay | Admitting: Addiction (Substance Use Disorder)

## 2019-11-27 ENCOUNTER — Other Ambulatory Visit: Payer: Self-pay

## 2019-11-27 ENCOUNTER — Ambulatory Visit (INDEPENDENT_AMBULATORY_CARE_PROVIDER_SITE_OTHER): Payer: 59 | Admitting: Addiction (Substance Use Disorder)

## 2019-11-27 DIAGNOSIS — F330 Major depressive disorder, recurrent, mild: Secondary | ICD-10-CM | POA: Diagnosis not present

## 2019-11-27 NOTE — Progress Notes (Signed)
      [facility] Counselor/Therapist Progress Note  Patient ID: IRMALEE RIEMENSCHNEIDER, MRN: [id number],    Date: 11/27/2019  Time Spent:  51mins  Treatment Type: Individual Therapy  Reported Symptoms: frustrated, feels like "too much"  Mental Status Exam:  Appearance:   Casual and Neat     Behavior:  Appropriate and Motivated  Motor:  Normal  Speech/Language:   Clear and Coherent and Normal Rate  Affect:  Appropriate  Mood:  angry, irritable and labile  Thought process:  normal  Thought content:    Rumination  Sensory/Perceptual disturbances:    WNL  Orientation:  x4  Attention:  Good  Concentration:  Good  Memory:  WNL  Fund of knowledge:   Good  Insight:    Good  Judgment:   Good  Impulse Control:  Good   Risk Assessment: Danger to Self:  No. Denied.  Self-injurious Behavior: No Danger to Others: No Duty to Warn:no Physical Aggression / Violence:No  Access to Firearms a concern: No  Gang Involvement:No   Subjective: Client reported feeling frustrated with feeling like "too much" with everyone in her life. Client reported being irritable with therapist, her husband, and others. Therapist used MI with client to support and validate client while also discussing the air conditioning issue she was having at the office, keeping her from getting to the client on time. Therapist apologized but explained the situation and the client confirmed she understood & empathized, agreeing that it was in fact pretty cold in the office. Client reported always having regrets when she blew up in conversations like with her husband and processed using BSP from the distress SUDs 7/10 in her stomach, until client received more understanding about her passion/emotions. Client felt validated and also understood a way to validate those for herself, regardless of what others do, while helping herself work from a more grounded spot she feels in her feet (SUDs 2/10), to keep her calm. Client made  progress in session and denied SI/HI/AVH or any worsening mood overall.   Interventions: Motivational Interviewing and Brainspotting  Diagnosis:   ICD-10-CM   1. Mild episode of recurrent major depressive disorder ([place])  F33.0     Plan of Care: Client to return for weekly therapy with Sammuel Cooper, therapist, to review again in 6 months. Client is to continue seeing medication provider for support of mood management.   Client to engage in CBT: challenging negative internal ruminationsand self-talk AEB journaling daily or expressing thoughts to support persons in their life and then challenging it with truth.  Client to engage in tapping to tap in healthy cognition that challenges negative rumination or deep negative core belief.  Client to practice DBT distress tolerance skills to decrease crying spells and thoughts of not being able to endure their suffering AEB using mindfulness, deep breathing, and TIP to increase tolerance for discomfort, discharge emotional distress, and increase their understanding that they can do hard things.  Client to utilize BSP (brainspotting) with therapist to help client identify and process triggers for their depressive episode and anxiety with goal of reducing said SUDs caused by depression/anxiety by 33% in the next 6 months. Client to prioritize sleep 8+ hours each week night AEB going to bed by 10pm each night.  Barnie Del, LCSW, LCAS, CCTP, CCS-I, BSP

## 2019-12-04 ENCOUNTER — Ambulatory Visit: Payer: 59 | Admitting: Addiction (Substance Use Disorder)

## 2019-12-05 ENCOUNTER — Other Ambulatory Visit: Payer: Self-pay

## 2019-12-05 ENCOUNTER — Encounter: Payer: Self-pay | Admitting: Addiction (Substance Use Disorder)

## 2019-12-05 ENCOUNTER — Ambulatory Visit (INDEPENDENT_AMBULATORY_CARE_PROVIDER_SITE_OTHER): Payer: 59 | Admitting: Addiction (Substance Use Disorder)

## 2019-12-05 DIAGNOSIS — F4323 Adjustment disorder with mixed anxiety and depressed mood: Secondary | ICD-10-CM | POA: Diagnosis not present

## 2019-12-05 DIAGNOSIS — F411 Generalized anxiety disorder: Secondary | ICD-10-CM | POA: Diagnosis not present

## 2019-12-05 NOTE — Progress Notes (Signed)
      [facility] Counselor/Therapist Progress Note  Patient ID: Michaela Roman, MRN: [id number],    Date: 12/05/2019  Time Spent:  25mins  Treatment Type: Individual Therapy  Reported Symptoms: thinking of the past.  Mental Status Exam:  Appearance:   Casual and Neat     Behavior:  Appropriate and Motivated  Motor:  Normal  Speech/Language:   Clear and Coherent and Normal Rate  Affect:  Appropriate  Mood:  angry, anxious and sad  Thought process:  normal  Thought content:    Rumination  Sensory/Perceptual disturbances:    WNL  Orientation:  x4  Attention:  Good  Concentration:  Good  Memory:  WNL  Fund of knowledge:   Good  Insight:    Good  Judgment:   Good  Impulse Control:  Good   Risk Assessment: Danger to Self:  No. Denied.  Self-injurious Behavior: No Danger to Others: No Duty to Warn:no Physical Aggression / Violence:No  Access to Firearms a concern: No  Gang Involvement:No   Subjective: Client reported struggling with despair always looking back into the past of her childhood and things she'd like to change. Therapist used MI & mindfulness with client to support her in processing and exploring the roots of these desires to change her past. Client made progress identifying more of her internal resentment towards her father and other men in her life, making it hard for her to trust men. Client described how this not trusting men as a trigger in her marriage and therapist used MI & BSP with client to help her process through roots of it. Client made progress gaining some curiosity about her lack of trust and more self-compassion that makes it difficult to hold onto the feeling of her resentments with"non-trustworthy men who let her down". Therapist assessed for stability and client denied SI/HI/AVH.   Interventions: Motivational Interviewing and Brainspotting  Diagnosis:   ICD-10-CM   1. GAD (generalized anxiety disorder)  F41.1   2. Adjustment disorder  with mixed anxiety and depressed mood  F43.23     Plan of Care: Client to return for weekly therapy with Sammuel Cooper, therapist, to review again in 6 months. Client is to continue seeing medication provider for support of mood management.   Client to engage in CBT: challenging negative internal ruminationsand self-talk AEB journaling daily or expressing thoughts to support persons in their life and then challenging it with truth.  Client to engage in tapping to tap in healthy cognition that challenges negative rumination or deep negative core belief.  Client to practice DBT distress tolerance skills to decrease crying spells and thoughts of not being able to endure their suffering AEB using mindfulness, deep breathing, and TIP to increase tolerance for discomfort, discharge emotional distress, and increase their understanding that they can do hard things.  Client to utilize BSP (brainspotting) with therapist to help client identify and process triggers for their depressive episode and anxiety with goal of reducing said SUDs caused by depression/anxiety by 33% in the next 6 months. Client to prioritize sleep 8+ hours each week night AEB going to bed by 10pm each night.  Barnie Del, LCSW, LCAS, CCTP, CCS-I, BSP

## 2019-12-11 ENCOUNTER — Encounter: Payer: Self-pay | Admitting: Addiction (Substance Use Disorder)

## 2019-12-11 ENCOUNTER — Ambulatory Visit (INDEPENDENT_AMBULATORY_CARE_PROVIDER_SITE_OTHER): Payer: 59 | Admitting: Addiction (Substance Use Disorder)

## 2019-12-11 ENCOUNTER — Other Ambulatory Visit: Payer: Self-pay

## 2019-12-11 DIAGNOSIS — F411 Generalized anxiety disorder: Secondary | ICD-10-CM | POA: Diagnosis not present

## 2019-12-11 NOTE — Progress Notes (Signed)
      [facility] Counselor/Therapist Progress Note  Patient ID: Michaela Roman, MRN: [id number],    Date: 12/11/2019  Time Spent:  75mins  Treatment Type: Individual Therapy  Reported Symptoms: curious  Mental Status Exam:  Appearance:   Casual and Neat     Behavior:  Appropriate and Motivated  Motor:  Normal  Speech/Language:   Clear and Coherent and Normal Rate  Affect:  Appropriate  Mood:  normal  Thought process:  normal  Thought content:    WNL- less shame talk  Sensory/Perceptual disturbances:    WNL  Orientation:  x4  Attention:  Good  Concentration:  Good  Memory:  WNL  Fund of knowledge:   Good  Insight:    Good  Judgment:   Good  Impulse Control:  Good   Risk Assessment: Danger to Self:  No. Denied.  Self-injurious Behavior: No Danger to Others: No Duty to Warn:no Physical Aggression / Violence:No  Access to Firearms a concern: No  Gang Involvement:No   Subjective: Client reported feeling proud of some of her progress and also curious about her reactions to situations. Client made progress in the last week in her home life, staying curious and non judgmental in moments of high anxiety esp related to marriage. Client processed a moment of finding a body and resource spot and tending to her regulated self. Therapist used MI & CBT with client to support client, give her affirmation, and help her challenge her self-critical thoughts. Client identified a thought that keeps her enslaved to fear: "What if it doesnt work outThe [employer] identified progress where she has seen things through while leading a project and hasnt failed. Therapist used CBT to continue to affirm her successes. Therapist assessed for stability and client denied SI/HI/AVH.   Interventions: Cognitive Behavioral Therapy and Motivational Interviewing  Diagnosis:   ICD-10-CM   1. GAD (generalized anxiety disorder)  F41.1     Plan of Care: Client to return for weekly therapy with Sammuel Cooper, therapist, to review again in 6 months. Client is to continue seeing medication provider for support of mood management.   Client to engage in CBT: challenging negative internal ruminationsand self-talk AEB journaling daily or expressing thoughts to support persons in their life and then challenging it with truth.  Client to engage in tapping to tap in healthy cognition that challenges negative rumination or deep negative core belief.  Client to practice DBT distress tolerance skills to decrease crying spells and thoughts of not being able to endure their suffering AEB using mindfulness, deep breathing, and TIP to increase tolerance for discomfort, discharge emotional distress, and increase their understanding that they can do hard things.  Client to utilize BSP (brainspotting) with therapist to help client identify and process triggers for their depressive episode and anxiety with goal of reducing said SUDs caused by depression/anxiety by 33% in the next 6 months. Client to prioritize sleep 8+ hours each week night AEB going to bed by 10pm each night.  Barnie Del, LCSW, LCAS, CCTP, CCS-I, BSP

## 2019-12-18 ENCOUNTER — Other Ambulatory Visit: Payer: Self-pay

## 2019-12-18 ENCOUNTER — Ambulatory Visit (INDEPENDENT_AMBULATORY_CARE_PROVIDER_SITE_OTHER): Payer: 59 | Admitting: Addiction (Substance Use Disorder)

## 2019-12-18 DIAGNOSIS — F411 Generalized anxiety disorder: Secondary | ICD-10-CM | POA: Diagnosis not present

## 2019-12-18 NOTE — Progress Notes (Signed)
      [facility] Counselor/Therapist Progress Note  Patient ID: Michaela Roman, MRN: [id number],    Date: 12/18/2019  Time Spent:  29mins  Treatment Type: Individual Therapy  Reported Symptoms: angry  Mental Status Exam:  Appearance:   Casual and Neat     Behavior:  Appropriate and Sharing  Motor:  Normal  Speech/Language:   Clear and Coherent and Normal Rate  Affect:  Appropriate  Mood:  angry  Thought process:  normal  Thought content:    Obsessions and Rumination  Sensory/Perceptual disturbances:    WNL  Orientation:  x4  Attention:  Good  Concentration:  Good  Memory:  WNL  Fund of knowledge:   Good  Insight:    Good  Judgment:   Good  Impulse Control:  Good   Risk Assessment: Danger to Self:  No. Denied.  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 Client processed and therapist used MI & BSP with client to help support her in processing the anger to look for relief and some discharge of the inner resentment that led to that heavy chest feeling she's consistently experiencing. Client made progress in session processing more of her grief to help her get through the stuck sinking feeling of anger she feels. Therapist encouraged the grieving of this for the client and to continue to journal her feelings and desires, not letting the resentments/feelings of failure keep her from dreaming. Therapist assessed for stability and client denied SI/HI/AVH.   Interventions: Motivational Interviewing and Brainspotting  Diagnosis:   ICD-10-CM   1. GAD (generalized anxiety disorder)  F41.1     Plan of Care: Client to return for weekly therapy with Sammuel Cooper, therapist, to review again in 6 months. Client is to  continue seeing medication provider for support of mood management.   Client to engage in CBT: challenging negative internal ruminationsand self-talk AEB journaling daily or expressing thoughts to support persons in their life and then challenging it with truth.  Client to engage in tapping to tap in healthy cognition that challenges negative rumination or deep negative core belief.  Client to practice DBT distress tolerance skills to decrease crying spells and thoughts of not being able to endure their suffering AEB using mindfulness, deep breathing, and TIP to increase tolerance for discomfort, discharge emotional distress, and increase their understanding that they can do hard things.  Client to utilize BSP (brainspotting) with therapist to help client identify and process triggers for their depressive episode and anxiety with goal of reducing said SUDs caused by depression/anxiety by 33% in the next 6 months. Client to prioritize sleep 8+ hours each week night AEB going to bed by 10pm each night.  Barnie Del, LCSW, LCAS, CCTP, CCS-I, BSP

## 2019-12-25 ENCOUNTER — Ambulatory Visit (INDEPENDENT_AMBULATORY_CARE_PROVIDER_SITE_OTHER): Payer: 59 | Admitting: Addiction (Substance Use Disorder)

## 2019-12-25 ENCOUNTER — Other Ambulatory Visit: Payer: Self-pay

## 2019-12-25 ENCOUNTER — Encounter: Payer: Self-pay | Admitting: Addiction (Substance Use Disorder)

## 2019-12-25 DIAGNOSIS — F411 Generalized anxiety disorder: Secondary | ICD-10-CM | POA: Diagnosis not present

## 2019-12-25 DIAGNOSIS — F4323 Adjustment disorder with mixed anxiety and depressed mood: Secondary | ICD-10-CM

## 2019-12-25 NOTE — Progress Notes (Signed)
      [facility] Counselor/Therapist Progress Note  Patient ID: Michaela Roman, MRN: [id number],    Date: 12/25/2019  Time Spent:  47mins  Treatment Type: Individual Therapy  Reported Symptoms: proud. nervous  Mental Status Exam:  Appearance:   Casual and Neat     Behavior:  Appropriate and Sharing  Motor:  Normal  Speech/Language:   Clear and Coherent and Normal Rate  Affect:  Appropriate  Mood:  anxious  Thought process:  normal  Thought content:    Obsessions and Rumination  Sensory/Perceptual disturbances:    WNL  Orientation:  x4  Attention:  Good  Concentration:  Good  Memory:  WNL  Fund of knowledge:   Good  Insight:    Good  Judgment:   Good  Impulse Control:  Good   Risk Assessment: Danger to Self:  No. Denied.  Self-injurious Behavior: No Danger to Others: No Duty to Warn:no Physical Aggression / Violence:No  Access to Firearms a concern: No  Gang Involvement:No   Subjective: Client reported feeling proud of a practice she started last year that has been helpful called Loving Kindness. Therapist inquired about how meditation has been helping her and used MI to provide affirmation and support. Client making progress reporting areas of growth in her marriage and with herself that are kinder interactions. Client identified that she can still get upset and hurt/angry, but can talk through it more emotionally regulated. Client recognizing a need to express her fears and insecurities to her husband by remaining open and vulnerable when discussing her feelings about not bringing enough money in on her own/doing her own work. Therapist used roleplaying with client to help her prepare for the discussion with her husband regarding her place with bringing in money in contrast with her insecurities. Therapist assessed for stability and client denied SI/HI/AVH.   Interventions: Roleplay, Mindfulness Meditation and Motivational Interviewing  Diagnosis:   ICD-10-CM    1. GAD (generalized anxiety disorder)  F41.1   2. Adjustment disorder with mixed anxiety and depressed mood  F43.23    Plan of Care: Client to return for weekly therapy with Sammuel Cooper, therapist, to review again in 6 months. Client is to continue seeing medication provider for support of mood management.   Client to engage in CBT: challenging negative internal ruminationsand self-talk AEB journaling daily or expressing thoughts to support persons in their life and then challenging it with truth.  Client to engage in tapping to tap in healthy cognition that challenges negative rumination or deep negative core belief.  Client to practice DBT distress tolerance skills to decrease crying spells and thoughts of not being able to endure their suffering AEB using mindfulness, deep breathing, and TIP to increase tolerance for discomfort, discharge emotional distress, and increase their understanding that they can do hard things.  Client to utilize BSP (brainspotting) with therapist to help client identify and process triggers for their depressive episode and anxiety with goal of reducing said SUDs caused by depression/anxiety by 33% in the next 6 months. Client to prioritize sleep 8+ hours each week night AEB going to bed by 10pm each night.  Barnie Del, LCSW, LCAS, CCTP, CCS-I, BSP

## 2020-01-01 ENCOUNTER — Ambulatory Visit (INDEPENDENT_AMBULATORY_CARE_PROVIDER_SITE_OTHER): Payer: 59 | Admitting: Addiction (Substance Use Disorder)

## 2020-01-01 ENCOUNTER — Other Ambulatory Visit: Payer: Self-pay

## 2020-01-01 DIAGNOSIS — F411 Generalized anxiety disorder: Secondary | ICD-10-CM | POA: Diagnosis not present

## 2020-01-01 NOTE — Progress Notes (Signed)
      [facility] Counselor/Therapist Progress Note  Patient ID: Michaela Roman, MRN: [id number],    Date: 01/01/2020  Time Spent:  70mins  Treatment Type: Individual Therapy  Reported Symptoms: proud of herself.   Mental Status Exam:  Appearance:   Casual and Neat     Behavior:  Appropriate and Sharing  Motor:  Normal  Speech/Language:   Clear and Coherent and Normal Rate  Affect:  Appropriate  Mood:  normal  Thought process:  normal  Thought content:    Obsessions and Rumination  Sensory/Perceptual disturbances:    WNL  Orientation:  x4  Attention:  Good  Concentration:  Good  Memory:  WNL  Fund of knowledge:   Good  Insight:    Good  Judgment:   Good  Impulse Control:  Good   Risk Assessment: Danger to Self:  No. Denied.  Self-injurious Behavior: No Danger to Others: No Duty to Warn:no Physical Aggression / Violence:No  Access to Firearms a concern: No  Gang Involvement:No   Subjective: Client reported  proud of herself after completing her short story. Therapist used MI to support client and provide affirmation for the client's progress. Therapist used CBT with client to help client continue to process her thoughts about her completing her short story and the feelings it brings up for her. Client also processed struggling with feeling accomplished throughout her life and processed her experiences as a female in the church that brought her resentment for the church when she was depressed and was shamed for having SI thoughts. Therapist used grief therapy to support client in processing the trauma, pain, and resentment for what happened in the church that affected her self esteem. Therapist assessed for stability and client denied SI/HI/AVH.   Interventions: Cognitive Behavioral Therapy and Motivational Interviewing  Diagnosis:   ICD-10-CM   1. GAD (generalized anxiety disorder)  F41.1     Plan of Care: Client to return for weekly therapy with Sammuel Cooper,  therapist, to review again in 6 months. Client is to continue seeing medication provider for support of mood management.   Client to engage in CBT: challenging negative internal ruminationsand self-talk AEB journaling daily or expressing thoughts to support persons in their life and then challenging it with truth.  Client to engage in tapping to tap in healthy cognition that challenges negative rumination or deep negative core belief.  Client to practice DBT distress tolerance skills to decrease crying spells and thoughts of not being able to endure their suffering AEB using mindfulness, deep breathing, and TIP to increase tolerance for discomfort, discharge emotional distress, and increase their understanding that they can do hard things.  Client to utilize BSP (brainspotting) with therapist to help client identify and process triggers for their depressive episode and anxiety with goal of reducing said SUDs caused by depression/anxiety by 33% in the next 6 months. Client to prioritize sleep 8+ hours each week night AEB going to bed by 10pm each night.  Barnie Del, LCSW, LCAS, CCTP, CCS-I, BSP

## 2020-01-02 ENCOUNTER — Telehealth: Payer: Self-pay | Admitting: *Deleted

## 2020-01-02 NOTE — Telephone Encounter (Signed)
PA request received from [facility] [facility] for medroxyprogesterone 5 mg tab.   PA submitted to plan via [email or web address]  Key: [id number] - PA Case ID: [id number]

## 2020-01-08 ENCOUNTER — Encounter: Payer: Self-pay | Admitting: Addiction (Substance Use Disorder)

## 2020-01-08 ENCOUNTER — Other Ambulatory Visit: Payer: Self-pay

## 2020-01-08 ENCOUNTER — Ambulatory Visit (INDEPENDENT_AMBULATORY_CARE_PROVIDER_SITE_OTHER): Payer: 59 | Admitting: Addiction (Substance Use Disorder)

## 2020-01-08 DIAGNOSIS — F411 Generalized anxiety disorder: Secondary | ICD-10-CM | POA: Diagnosis not present

## 2020-01-08 NOTE — Progress Notes (Signed)
      [facility] Counselor/Therapist Progress Note  Patient ID: Michaela Roman, MRN: [id number],    Date: 01/08/2020  Time Spent:  53mins  Treatment Type: Individual Therapy  Reported Symptoms: stressed.   Mental Status Exam:  Appearance:   Casual and Neat     Behavior:  Appropriate and Sharing  Motor:  Normal  Speech/Language:   Clear and Coherent and Normal Rate  Affect:  Appropriate  Mood:  stressed  Thought process:  normal  Thought content:    Rumination  Sensory/Perceptual disturbances:    WNL  Orientation:  x4  Attention:  Good  Concentration:  Good  Memory:  WNL  Fund of knowledge:   Good  Insight:    Good  Judgment:   Good  Impulse Control:  Good   Risk Assessment: Danger to Self:  No. Denied.  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 Client reported cleaning all weekend and being wore out. Client discussed how being an introvert affects her ability to want to engage with all the set staff in her house. Therapist used DBT skills with client and MI to encourage client to remain very observant about her emotions surrounding these events and also about this time of year. Client in session worked on increasing her emotional flexibility by using distress tolerance skills. Therapist also led client in brainspotting to help client get curious about triggers of her negative emotions surrounding this time of the year and around reflecting over this past year of hers. Therapist assessed for stability and client denied SI/HI/AVH.   Interventions: Dialectical Behavioral Therapy, Motivational Interviewing and Brainspotting  Diagnosis:   ICD-10-CM   1. GAD (generalized anxiety disorder)  F41.1      Plan of Care: Client to return for weekly  therapy with Sammuel Cooper, therapist, to review again in 6 months. Client is to continue seeing medication provider for support of mood management.   Client to engage in CBT: challenging negative internal ruminationsand self-talk AEB journaling daily or expressing thoughts to support persons in their life and then challenging it with truth.  Client to engage in tapping to tap in healthy cognition that challenges negative rumination or deep negative core belief.  Client to practice DBT distress tolerance skills to decrease crying spells and thoughts of not being able to endure their suffering AEB using mindfulness, deep breathing, and TIP to increase tolerance for discomfort, discharge emotional distress, and increase their understanding that they can do hard things.  Client to utilize BSP (brainspotting) with therapist to help client identify and process triggers for their depressive episode and anxiety with goal of reducing said SUDs caused by depression/anxiety by 33% in the next 6 months. Client to prioritize sleep 8+ hours each week night AEB going to bed by 10pm each night.  Barnie Del, LCSW, LCAS, CCTP, CCS-I, BSP

## 2020-01-10 NOTE — Telephone Encounter (Signed)
Additional information faxed to [facility].

## 2020-01-14 ENCOUNTER — Ambulatory Visit: Payer: Self-pay | Admitting: Obstetrics and Gynecology

## 2020-01-14 ENCOUNTER — Telehealth: Payer: Self-pay

## 2020-01-14 NOTE — Telephone Encounter (Signed)
AEX 10/18/19 HRT-Dotti Patch  Osteopenia Last MMG 08/27/19- Birads 1, Neg   Spoke with pt. Pt states finding new lump in left breast over the weekend with SBE. Pt states its near left nipple, size of a marble. Pt denies breast skin or size changes, nipple discharge, redness, pain, warmth, fever, chills. Pt states had Covid booster 5-6 weeks ago.  Pt advised to have OV for exam and evaluation. Pt agreeable. Pt scheduled as work-in on 11/15 at 315 pm. Pt aware is work-in and is agreeable to date and time of appt.  Routing to Dr Quincy Simmonds for review. Encounter closed.

## 2020-01-14 NOTE — Progress Notes (Signed)
GYNECOLOGY  VISIT   HPI: 49 y.o.   Married  Caucasian  female   [id number] with Patient's last menstrual period was 08/07/2015 (approximate).   here for left breast mass x 4 days. Patient denies any tenderness or redness. Feels a lump at the 2:00 position.   No trauma to the breast.   Doing heavier lifting.   Received her Covid booster 5 - 6 weeks ago.  On HRT. She is asking about continuing her HRT.  GYNECOLOGIC HISTORY: Patient's last menstrual period was 08/07/2015 (approximate). Contraception: PMP Menopausal hormone therapy: estradiol patch 0.05mg , Provera 5mg  Last mammogram:  08-27-19 3D/Neg/density B/BiRads1 Last pap smear: 06-24-17 Neg:Neg HR HPV        OB History    Gravida  0   Para  0   Term  0   Preterm  0   AB  0   Living  0     SAB  0   TAB  0   Ectopic  0   Multiple  0   Live Births                 There are no problems to display for this patient.   Past Medical History:  Diagnosis Date  . Anxiety   . Depression   . Hormone disorder   . Hypothyroidism   . Premature ovarian failure    Dx'd age 60  . Thyroid disease    hypothyroid    Past Surgical History:  Procedure Laterality Date  . DILATATION & CURETTAGE/HYSTEROSCOPY WITH MYOSURE N/A 10/07/2015   Procedure: DILATATION & CURETTAGE/HYSTEROSCOPY WITH MYOSURE;  Surgeon: Nunzio Cobbs, MD;  Location: [facility];  Service: Gynecology;  Laterality: N/A;  endometrial polyp  . WISDOM TOOTH EXTRACTION      Current Outpatient Medications  Medication Sig Dispense Refill  . calcium gluconate 500 MG tablet Take 1 tablet by mouth daily.    Marland Kitchen escitalopram (LEXAPRO) 5 MG tablet Take 1 tablet (5 mg total) by mouth daily. 90 tablet 1  . estradiol (DOTTI) 0.05 MG/24HR patch APPLY 1 PATCH TOPICALLY TWICE A WEEK 24 patch 3  . levothyroxine (SYNTHROID) 112 MCG tablet TAKE 1 TABLET BY MOUTH ONCE DAILY BEFORE BREAKFAST 90 tablet 3  . medroxyPROGESTERone (PROVERA) 5 MG tablet Take 1 tablet (5  mg total) by mouth daily. 90 tablet 3  . Multiple Vitamins-Minerals (MULTIVITAMIN PO) Take 1 tablet by mouth daily.     No current facility-administered medications for this visit.     ALLERGIES: Sertraline  Family History  Problem Relation Age of Onset  . Thyroid disease Sister        hypothyroidism  . Hypertension Maternal Grandmother   . Heart disease Maternal Grandmother   . Depression Maternal Grandmother   . Diabetes Maternal Grandfather   . Other Maternal Grandfather        benign brain tumor  . Depression Mother   . Multiple sclerosis Father   . Dementia Paternal Grandmother     Social History   Socioeconomic History  . Marital status: Married    Spouse name: Not on file  . Number of children: Not on file  . Years of education: Not on file  . Highest education level: Not on file  Occupational History  . Not on file  Tobacco Use  . Smoking status: Never Smoker  . Smokeless tobacco: Never Used  Vaping Use  . Vaping Use: Never used  Substance and Sexual Activity  . Alcohol  use: Yes    Alcohol/week: 4.0 standard drinks    Types: 2 Glasses of wine, 2 Standard drinks or equivalent per week    Comment: one drink a week  . Drug use: No  . Sexual activity: Yes    Partners: Female, Female    Birth control/protection: None, Post-menopausal  Other Topics Concern  . Not on file  Social History Narrative  . Not on file   Social Determinants of Health   Financial Resource Strain:   . Difficulty of Paying Living Expenses: Not on file  Food Insecurity:   . Worried About Charity fundraiser in the Last Year: Not on file  . Ran Out of Food in the Last Year: Not on file  Transportation Needs:   . Lack of Transportation (Medical): Not on file  . Lack of Transportation (Non-Medical): Not on file  Physical Activity:   . Days of Exercise per Week: Not on file  . Minutes of Exercise per Session: Not on file  Stress:   . Feeling of Stress : Not on file  Social  Connections:   . Frequency of Communication with Friends and Family: Not on file  . Frequency of Social Gatherings with Friends and Family: Not on file  . Attends Religious Services: Not on file  . Active Member of Clubs or Organizations: Not on file  . Attends Archivist Meetings: Not on file  . Marital Status: Not on file  Intimate Partner Violence:   . Fear of Current or Ex-Partner: Not on file  . Emotionally Abused: Not on file  . Physically Abused: Not on file  . Sexually Abused: Not on file    Review of Systems  All other systems reviewed and are negative.   PHYSICAL EXAMINATION:    BP 108/62   Pulse (!) 56   Ht 5' 2.75" (1.594 m)   Wt 156 lb (70.8 kg)   LMP 08/07/2015 (Approximate)   SpO2 98%   BMI 27.85 kg/m     General appearance: alert, cooperative and appears stated age   Breasts: right - normal appearance, no masses or tenderness, No nipple retraction or dimpling, No nipple discharge or bleeding, No axillary adenopathy Left - slight retraction of the skin left later side, fullness at 2:00.  No specific mass. No nipple retraction. No nipple discharge or bleeding, No axillary adenopathy.  Chaperone was present for the exam.  ASSESSMENT  Left breast mass.  Recent Covid booster.  HRT.   PLAN  Dx left mammogram and left breat [place].  We discussed potentially weaning off HRT over the next year.  May wean down to estradiol 0.0375 mg twice weekly and Provera 2.5 mg daily.  WHI reviewed. Fu prn.   22  total time was spent for this patient encounter, including preparation, face-to-face counseling with the patient, coordination of care, and documentation of the encounter.

## 2020-01-14 NOTE — Telephone Encounter (Signed)
Patient is calling in regards to lump in left breast.

## 2020-01-15 ENCOUNTER — Other Ambulatory Visit: Payer: Self-pay | Admitting: Obstetrics and Gynecology

## 2020-01-15 ENCOUNTER — Ambulatory Visit: Payer: 59 | Admitting: Obstetrics and Gynecology

## 2020-01-15 ENCOUNTER — Encounter: Payer: Self-pay | Admitting: Obstetrics and Gynecology

## 2020-01-15 ENCOUNTER — Telehealth: Payer: Self-pay | Admitting: Obstetrics and Gynecology

## 2020-01-15 ENCOUNTER — Other Ambulatory Visit: Payer: Self-pay

## 2020-01-15 VITALS — BP 108/62 | HR 56 | Ht 62.75 in | Wt 156.0 lb

## 2020-01-15 DIAGNOSIS — N6320 Unspecified lump in the left breast, unspecified quadrant: Secondary | ICD-10-CM

## 2020-01-15 DIAGNOSIS — Z79890 Hormone replacement therapy (postmenopausal): Secondary | ICD-10-CM | POA: Diagnosis not present

## 2020-01-15 NOTE — Telephone Encounter (Signed)
Patient placed in MMG hold.  

## 2020-01-15 NOTE — Telephone Encounter (Signed)
Please contact patient to schedule a dx left mammogram and left breast and axillary ultrasound.   She has a lump at 2:00 at the edge of her areola.   She had her Covid vaccine 5 - 6 weeks ago.

## 2020-01-15 NOTE — Telephone Encounter (Signed)
Call placed to [facility] to schedule Dx MMG and [place] Spoke with Denise Pt scheduled on 11/30 at 150 pm  Call placed to pt. Pt agreeable to date and time of appt at [facility] for breast imaging.  Routing to Dr Quincy Simmonds for review. Orders placed by TBC for co-sign.  Cc: Sharee Pimple, RN for [facility] hold Encounter closed

## 2020-01-17 ENCOUNTER — Other Ambulatory Visit: Payer: Self-pay

## 2020-01-17 ENCOUNTER — Encounter: Payer: Self-pay | Admitting: Addiction (Substance Use Disorder)

## 2020-01-17 ENCOUNTER — Ambulatory Visit (INDEPENDENT_AMBULATORY_CARE_PROVIDER_SITE_OTHER): Payer: 59 | Admitting: Addiction (Substance Use Disorder)

## 2020-01-17 DIAGNOSIS — F411 Generalized anxiety disorder: Secondary | ICD-10-CM

## 2020-01-17 NOTE — Progress Notes (Signed)
      [facility] Counselor/Therapist Progress Note  Patient ID: Michaela Roman, MRN: [id number],    Date: 01/17/2020  Time Spent:  47mins  Treatment Type: Individual Therapy  Reported Symptoms: scared.  Mental Status Exam:  Appearance:   Casual and Neat     Behavior:  Appropriate and Sharing  Motor:  Normal  Speech/Language:   Clear and Coherent and Normal Rate  Affect:  Appropriate  Mood:  anxious and stressed  Thought process:  normal  Thought content:    Rumination  Sensory/Perceptual disturbances:    WNL  Orientation:  x4  Attention:  Good  Concentration:  Good  Memory:  WNL  Fund of knowledge:   Good  Insight:    Good  Judgment:   Good  Impulse Control:  Good   Risk Assessment: Danger to Self:  No. Denied.  Self-injurious Behavior: No Danger to Others: No Duty to Warn:no Physical Aggression / Violence:No  Access to Firearms a concern: No  Gang Involvement:No   Subjective: Client reported feeling very anxious about a mass she found in her breast. Client processed her worst fears and therapist used MI & CBT to support client, validate her fear, and help client to challenge worst case-scenario thoughts. Therapist used mindfulness with client to help to emotionally ground and regulate her anxiety. Therapist assessed client's safety and client denied SI/HI/AVH.   Interventions: Cognitive Behavioral Therapy, Mindfulness Meditation and Motivational Interviewing  Diagnosis:   ICD-10-CM   1. GAD (generalized anxiety disorder)  F41.1     Plan of Care: Client to return for weekly therapy with Sammuel Cooper, therapist, to review again in 6 months. Client is to continue seeing medication provider for support of mood management.   Client to engage in CBT: challenging negative internal ruminationsand self-talk AEB journaling daily or expressing thoughts to support persons in their life and then challenging it with truth.  Client to engage in tapping to tap in healthy  cognition that challenges negative rumination or deep negative core belief.  Client to practice DBT distress tolerance skills to decrease crying spells and thoughts of not being able to endure their suffering AEB using mindfulness, deep breathing, and TIP to increase tolerance for discomfort, discharge emotional distress, and increase their understanding that they can do hard things.  Client to utilize BSP (brainspotting) with therapist to help client identify and process triggers for their depressive episode and anxiety with goal of reducing said SUDs caused by depression/anxiety by 33% in the next 6 months. Client to prioritize sleep 8+ hours each week night AEB going to bed by 10pm each night.  Barnie Del, LCSW, LCAS, CCTP, CCS-I, BSP

## 2020-01-22 ENCOUNTER — Other Ambulatory Visit: Payer: Self-pay

## 2020-01-22 ENCOUNTER — Ambulatory Visit (INDEPENDENT_AMBULATORY_CARE_PROVIDER_SITE_OTHER): Payer: 59 | Admitting: Addiction (Substance Use Disorder)

## 2020-01-22 ENCOUNTER — Encounter: Payer: Self-pay | Admitting: Addiction (Substance Use Disorder)

## 2020-01-22 DIAGNOSIS — F411 Generalized anxiety disorder: Secondary | ICD-10-CM

## 2020-01-22 NOTE — Progress Notes (Signed)
      [facility] Counselor/Therapist Progress Note  Patient ID: Michaela Roman, MRN: [id number],    Date: 01/22/2020  Time Spent:  79mins  Treatment Type: Individual Therapy  Reported Symptoms: nervous  Mental Status Exam:  Appearance:   Casual and Neat     Behavior:  Appropriate and Sharing  Motor:  Normal  Speech/Language:   Clear and Coherent and Normal Rate  Affect:  Appropriate  Mood:  anxious  Thought process:  normal  Thought content:    Rumination  Sensory/Perceptual disturbances:    WNL  Orientation:  x4  Attention:  Good  Concentration:  Good  Memory:  WNL  Fund of knowledge:   Good  Insight:    Good  Judgment:   Good  Impulse Control:  Good   Risk Assessment: Danger to Self:  No. Denied.  Self-injurious Behavior: No Danger to Others: No Duty to Warn:no Physical Aggression / Violence:No  Access to Firearms a concern: No  Gang Involvement:No   Subjective: Client reported increased anxiety about her Mamogram tomorrow but feeling better that its sooner. Client not looking forward to having to feel everything while spending the holiday with her husband's family. Therapist used MI & psychodynamic to support client in processing more about the family dynamics and clients possible attachments or lack there-of with her mother and father. Therapist offered encouragement and support for client as she deals with anxious grief related to the lump she found in her breast. Therapist assessed client's safety and client denied SI/HI/AVH.   Interventions: Motivational Interviewing, Family Systems and psychodynamic  Diagnosis:   ICD-10-CM   1. GAD (generalized anxiety disorder)  F41.1     Plan of Care: Client to return for weekly therapy with Sammuel Cooper, therapist, to review again in 6 months. Client is to continue seeing medication provider for support of mood management.   Client to engage in CBT: challenging negative internal ruminationsand self-talk AEB  journaling daily or expressing thoughts to support persons in their life and then challenging it with truth.  Client to engage in tapping to tap in healthy cognition that challenges negative rumination or deep negative core belief.  Client to practice DBT distress tolerance skills to decrease crying spells and thoughts of not being able to endure their suffering AEB using mindfulness, deep breathing, and TIP to increase tolerance for discomfort, discharge emotional distress, and increase their understanding that they can do hard things.  Client to utilize BSP (brainspotting) with therapist to help client identify and process triggers for their depressive episode and anxiety with goal of reducing said SUDs caused by depression/anxiety by 33% in the next 6 months. Client to prioritize sleep 8+ hours each week night AEB going to bed by 10pm each night.  Barnie Del, LCSW, LCAS, CCTP, CCS-I, BSP

## 2020-01-23 ENCOUNTER — Other Ambulatory Visit: Payer: Self-pay | Admitting: Obstetrics and Gynecology

## 2020-01-23 ENCOUNTER — Ambulatory Visit
Admission: RE | Admit: 2020-01-23 | Discharge: 2020-01-23 | Disposition: A | Discharge status: Home / Self Care | Payer: 59 | Source: Ambulatory Visit | Attending: Obstetrics and Gynecology | Admitting: Obstetrics and Gynecology

## 2020-01-23 DIAGNOSIS — N6320 Unspecified lump in the left breast, unspecified quadrant: Secondary | ICD-10-CM

## 2020-01-28 ENCOUNTER — Telehealth: Payer: Self-pay

## 2020-01-28 NOTE — Telephone Encounter (Signed)
Pt had Dx MMG and Left breast US on 01/23/20. Pt is scheduled for breast bx on 02/04/20  Pt wanting to know what recommendations per Dr Quincy Simmonds for [facility] results and for HRT.   Routing to Dr Quincy Simmonds , please advise

## 2020-01-28 NOTE — Telephone Encounter (Signed)
Patient calling about mammogram results. She is wanting to discuss her HRTs.

## 2020-01-29 ENCOUNTER — Other Ambulatory Visit: Payer: Self-pay

## 2020-01-29 ENCOUNTER — Encounter: Payer: Self-pay | Admitting: Addiction (Substance Use Disorder)

## 2020-01-29 ENCOUNTER — Other Ambulatory Visit: Payer: 59

## 2020-01-29 ENCOUNTER — Ambulatory Visit (INDEPENDENT_AMBULATORY_CARE_PROVIDER_SITE_OTHER): Payer: 59 | Admitting: Addiction (Substance Use Disorder)

## 2020-01-29 DIAGNOSIS — F411 Generalized anxiety disorder: Secondary | ICD-10-CM | POA: Diagnosis not present

## 2020-01-29 NOTE — Progress Notes (Signed)
      [facility] Counselor/Therapist Progress Note  Patient ID: Michaela Roman, MRN: [id number],    Date: 01/29/2020  Time Spent:  97mins  Treatment Type: Individual Therapy  Reported Symptoms: devastated  Mental Status Exam:  Appearance:   Casual and Neat     Behavior:  Appropriate and Sharing  Motor:  Normal  Speech/Language:   Clear and Coherent and Normal Rate  Affect:  Appropriate  Mood:  anxious and sad  Thought process:  normal  Thought content:    Rumination  Sensory/Perceptual disturbances:    WNL  Orientation:  x4  Attention:  Good  Concentration:  Good  Memory:  WNL  Fund of knowledge:   Good  Insight:    Good  Judgment:   Good  Impulse Control:  Good   Risk Assessment: Danger to Self:  No. Denied.  Self-injurious Behavior: No Danger to Others: No Duty to Warn:no Physical Aggression / Violence:No  Access to Firearms a concern: No  Gang Involvement:No   Subjective: Client reported complete devastation after getting news about a breast mass she found last week. Client reported they think it is cancerous and she has to have a biopsy. Client fearful of the news and searching for a reason as to why this happens when everything is going good in her life. Client dealing with grief and anger/bargaining. Therapist used MI & grief therapy to support client in processing the grief & anxiety. Therapist offered encouragement as client takes next steps. Therapist assessed client's safety and client denied SI/HI/AVH.   Interventions: Motivational Interviewing and Grief Therapy  Diagnosis:   ICD-10-CM   1. GAD (generalized anxiety disorder)  F41.1     Plan of Care: Client to return for weekly therapy with Sammuel Cooper, therapist, to review again in 6 months. Client is to continue seeing medication provider for support of mood management.   Client to engage in CBT: challenging negative internal ruminationsand self-talk AEB journaling daily or expressing thoughts  to support persons in their life and then challenging it with truth.  Client to engage in tapping to tap in healthy cognition that challenges negative rumination or deep negative core belief.  Client to practice DBT distress tolerance skills to decrease crying spells and thoughts of not being able to endure their suffering AEB using mindfulness, deep breathing, and TIP to increase tolerance for discomfort, discharge emotional distress, and increase their understanding that they can do hard things.  Client to utilize BSP (brainspotting) with therapist to help client identify and process triggers for their depressive episode and anxiety with goal of reducing said SUDs caused by depression/anxiety by 33% in the next 6 months. Client to prioritize sleep 8+ hours each week night AEB going to bed by 10pm each night.  Barnie Del, LCSW, LCAS, CCTP, CCS-I, BSP

## 2020-01-29 NOTE — Telephone Encounter (Signed)
Left message for pt to return call to triage RN. 

## 2020-01-29 NOTE — Telephone Encounter (Signed)
Does she want to wait for results of the biopsy before making any changes in her HRT?  Her other options are to start reducing the dosage to [place] 0.0375 mg twice weekly and Provera 2.5 mg daily or to stop her estrogen and progesterone completely.

## 2020-01-30 NOTE — Telephone Encounter (Signed)
Spoke with pt. Pt given recommendations and advice per Dr Quincy Simmonds. Pt verbalized understanding and is agreeable to stay on HRT for now and wait for breast bx results before making decision.  Advised will give update to Dr Quincy Simmonds.  Routing to Dr Quincy Simmonds  Encounter closed

## 2020-01-30 NOTE — Telephone Encounter (Signed)
Patient is returning a call to Stephanie. °

## 2020-02-04 ENCOUNTER — Other Ambulatory Visit: Payer: Self-pay

## 2020-02-04 ENCOUNTER — Ambulatory Visit
Admission: RE | Admit: 2020-02-04 | Discharge: 2020-02-04 | Disposition: A | Discharge status: Home / Self Care | Payer: 59 | Source: Ambulatory Visit | Attending: Obstetrics and Gynecology | Admitting: Obstetrics and Gynecology

## 2020-02-04 ENCOUNTER — Ambulatory Visit: Payer: 59 | Admitting: Addiction (Substance Use Disorder)

## 2020-02-04 DIAGNOSIS — N6320 Unspecified lump in the left breast, unspecified quadrant: Secondary | ICD-10-CM

## 2020-02-04 HISTORY — PX: BREAST BIOPSY: SHX20

## 2020-02-05 ENCOUNTER — Ambulatory Visit: Payer: 59 | Admitting: Addiction (Substance Use Disorder)

## 2020-02-05 ENCOUNTER — Telehealth: Payer: Self-pay

## 2020-02-05 ENCOUNTER — Encounter: Payer: Self-pay | Admitting: Obstetrics and Gynecology

## 2020-02-05 NOTE — Telephone Encounter (Signed)
Breast Bx 12/6 with clip placement.   Call placed to pt. Spoke with pt. Pt states got a call today from NP at [facility] with breast cancer diagnosis. Pt states is in left breast lump and left lymph node under armpit.   Pt asking what to do with current recommendations with HRT?  Pt placed last patch on 12/6 and took last progesterone pill last night.   Advised pt will give Dr Quincy Simmonds update and once has recommendations, will return call. Pt agreeable.   Routing to Dr Quincy Simmonds.

## 2020-02-05 NOTE — Telephone Encounter (Signed)
Pt sent the following mychart message:  Cherree, [facility] Hi Dr. Quincy Simmonds -   I got the results of my biopsy today - it's news I dreaded and didn't want to receive.   I'm wondering what all this means for my HRT medications and about scheduling a follow- up visit with you.   Thanks much,   [employer]

## 2020-02-05 NOTE — Telephone Encounter (Signed)
Phone call made personally to the patient regarding her breast cancer diagnosis.   She has an appointment next week at the [facility] for her first visit.   I recommended stopping her estrogen and progesterone treatment.  She may experience hot flashes that can be treated with nonhormonal medication.   I offered to help in any way possible.

## 2020-02-06 ENCOUNTER — Telehealth: Payer: Self-pay | Admitting: *Deleted

## 2020-02-06 ENCOUNTER — Encounter: Payer: Self-pay | Admitting: *Deleted

## 2020-02-06 DIAGNOSIS — C50412 Malignant neoplasm of upper-outer quadrant of left female breast: Secondary | ICD-10-CM | POA: Insufficient documentation

## 2020-02-06 DIAGNOSIS — Z170 Estrogen receptor positive status [ER+]: Secondary | ICD-10-CM | POA: Insufficient documentation

## 2020-02-06 NOTE — Telephone Encounter (Signed)
Continue in [facility] hold per Dr Quincy Simmonds. See result note.   Routing to [place], Therapist, sports

## 2020-02-06 NOTE — Telephone Encounter (Signed)
Confirmed [facility] for 02/13/20 at 0815 .  Instructions and contact information given.

## 2020-02-06 NOTE — Telephone Encounter (Signed)
Continue [facility] hold.   Encounter closed.

## 2020-02-07 ENCOUNTER — Encounter: Payer: Self-pay | Admitting: *Deleted

## 2020-02-12 ENCOUNTER — Ambulatory Visit (INDEPENDENT_AMBULATORY_CARE_PROVIDER_SITE_OTHER): Payer: 59 | Admitting: Addiction (Substance Use Disorder)

## 2020-02-12 ENCOUNTER — Other Ambulatory Visit: Payer: Self-pay

## 2020-02-12 DIAGNOSIS — F4323 Adjustment disorder with mixed anxiety and depressed mood: Secondary | ICD-10-CM

## 2020-02-12 DIAGNOSIS — F411 Generalized anxiety disorder: Secondary | ICD-10-CM

## 2020-02-12 NOTE — Progress Notes (Signed)
[place]  Telephone:(336) [phone] Fax:(336) [phone]     ID: Raylyn Carton Worrall DOB: 02/17/1971  MR#: [id number]  RDE#:[id number]  Patient Care Team: Glenis Smoker, MD as PCP - General (Family Medicine) Mauro Kaufmann, RN as Oncology Nurse Navigator Rockwell Germany, RN as Oncology Nurse Navigator Erroll Luna, MD as Consulting Physician (General Surgery) Chandlar Guice, Virgie Dad, MD as Consulting Physician (Oncology) Gery Pray, MD as Consulting Physician (Radiation Oncology) Nunzio Cobbs, MD as Consulting Physician (Obstetrics and Gynecology) Chauncey Cruel, MD OTHER MD:  CHIEF COMPLAINT: Estrogen receptor positive breast cancer  CURRENT TREATMENT: Awaiting definitive surgery   HISTORY OF CURRENT ILLNESS: XUAN MATEUS herself palpated a left breast lump. Of note, annual screening mammogram from 08/27/2019 was negative. She underwent left diagnostic mammography with tomography and left breast ultrasonography at [facility] on 01/23/2020 showing: breast density category B; palpable 1.3 cm mass in left breast at 2 o'clock that extends to skin; one borderline abnormal left axillary lymph node.  Accordingly on 02/04/2020 she proceeded to biopsy of the left breast area in question. The pathology from this procedure ([id number]) showed: invasive ductal carcinoma, grade 2. Prognostic indicators significant for: estrogen receptor, 80% positive and progesterone receptor, 80% positive, both with moderate-strong staining intensity. Proliferation marker Ki67 at 30%. HER2 negative by immunohistochemistry (0).  The biopsied lymph node was positive for metastatic carcinoma.  The patient's subsequent history is as detailed below.   INTERVAL HISTORY: Stephenie was evaluated in the multidisciplinary breast cancer clinic on 02/13/2020 accompanied by her husband Letitia Caul. Her case was also presented at the multidisciplinary breast cancer conference  on the same day. At that time a preliminary plan was proposed: Breast conserving surgery with targeted axillary dissection, MammaPrint from the original biopsy, adjuvant radiation, genetics   REVIEW OF SYSTEMS: On the provided questionnaire, Allyn reports loss of sleep, wearing glasses/contacts, palpable breast lump, breast dimpling, psoriasis, thyroid problems, anxiety, and depression. The patient denies unusual headaches, visual changes, nausea, vomiting, stiff neck, dizziness, or gait imbalance. There has been no cough, phlegm production, or pleurisy, no chest pain or pressure, and no change in bowel or bladder habits. The patient denies fever, rash, bleeding, unexplained fatigue or unexplained weight loss. A detailed review of systems was otherwise entirely negative.   COVID 19 VACCINATION STATUS: Status post [employer] x2 with booster October 2021   PAST MEDICAL HISTORY: Past Medical History:  Diagnosis Date  . Anxiety   . Breast cancer ([place])   . Depression   . Hormone disorder   . Hypothyroidism   . Premature ovarian failure    Dx'd age 36  . Thyroid disease    hypothyroid    PAST SURGICAL HISTORY: Past Surgical History:  Procedure Laterality Date  . DILATATION & CURETTAGE/HYSTEROSCOPY WITH MYOSURE N/A 10/07/2015   Procedure: DILATATION & CURETTAGE/HYSTEROSCOPY WITH MYOSURE;  Surgeon: Nunzio Cobbs, MD;  Location: [facility];  Service: Gynecology;  Laterality: N/A;  endometrial polyp  . WISDOM TOOTH EXTRACTION      FAMILY HISTORY: Family History  Problem Relation Age of Onset  . Thyroid disease Sister        hypothyroidism  . Hypertension Maternal Grandmother   . Heart disease Maternal Grandmother   . Depression Maternal Grandmother   . Diabetes Maternal Grandfather   . Other Maternal Grandfather        benign brain tumor  . Depression Mother   . Multiple sclerosis Father   . Dementia  Paternal Grandmother   . Alcohol abuse Paternal Grandfather   . Colon cancer  Maternal Uncle   . Breast cancer Cousin    Her father is age 7 and her mother age 90, as of 01/2020. [place] has one brother and one sister. She reports breast cancer in her mother's paternal first cousin in her 68's. She also notes colon cancer in her mother's paternal uncle in his 92's.   GYNECOLOGIC HISTORY:  Patient's last menstrual period was 08/07/2015 (approximate). Menarche: 49 years old [place] P 0 LMP 2015 Contraceptive: used from age [age] HRT used for 11 years, stopped with cancer diagnosis  Hysterectomy? no BSO? 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 Husband Talmadge "Letitia Caul" Burt Ek is a Doctor, hospital, as well as the Chief of Staff [employer] here in [place]. She lives at home with Letitia Caul, no pets.  The patient is a Buddhist.    ADVANCED DIRECTIVES: in place   HEALTH MAINTENANCE: Social History   Tobacco Use  . Smoking status: Never Smoker  . Smokeless tobacco: Never Used  Vaping Use  . Vaping Use: Never used  Substance Use Topics  . Alcohol use: Yes    Alcohol/week: 2.0 standard drinks    Types: 2 Glasses of wine per week    Comment: one drink a week  . Drug use: No     Colonoscopy: n/a (age)  PAP: 09/2019  Bone density: 11/2018, -1.5   Allergies  Allergen Reactions  . Sertraline Hives    Current Outpatient Medications  Medication Sig Dispense Refill  . calcium gluconate 500 MG tablet Take 1 tablet by mouth daily.    Marland Kitchen levothyroxine (SYNTHROID) 112 MCG tablet TAKE 1 TABLET BY MOUTH ONCE DAILY BEFORE BREAKFAST 90 tablet 3  . Multiple Vitamins-Minerals (MULTIVITAMIN PO) Take 1 tablet by mouth daily.    Marland Kitchen escitalopram (LEXAPRO) 5 MG tablet Take 1 tablet (5 mg total) by mouth daily. 90 tablet 1   No current facility-administered medications for this visit.    OBJECTIVE: White woman in no acute distress  Vitals:   02/13/20 0857  BP: 123/65  Pulse: 78   Resp: 18  Temp: (!) 97.5 F (36.4 C)  SpO2: 100%     Body mass index is 27.41 kg/m.   Wt Readings from Last 3 Encounters:  02/13/20 153 lb 8 oz (69.6 kg)  01/15/20 156 lb (70.8 kg)  10/18/19 155 lb (70.3 kg)      ECOG FS:0 - Asymptomatic  Ocular: Sclerae unicteric, pupils round and equal Ear-nose-throat: Wearing a mask Lymphatic: No cervical or supraclavicular adenopathy Lungs no rales or rhonchi Heart regular rate and rhythm Abd soft, nontender, positive bowel sounds MSK no focal spinal tenderness, no joint edema Neuro: non-focal, well-oriented, appropriate affect Breasts: The right breast is unremarkable.  I do not palpate a well-defined mass in the left breast.  There is a mild ecchymosis.  There are no skin or nipple changes of concern.  Both axillae are benign.  LAB RESULTS:  CMP     Component Value Date/Time   NA 143 02/13/2020 0821   NA 140 10/17/2018 0922   K 3.7 02/13/2020 0821   CL 106 02/13/2020 0821   CO2 31 02/13/2020 0821   GLUCOSE 105 (H) 02/13/2020 0821   BUN 15 02/13/2020 0821   BUN 12 10/17/2018 0922   CREATININE 1.03 (H) 02/13/2020 0821   CREATININE 0.95 08/22/2015 1653   CALCIUM  9.4 02/13/2020 0821   PROT 7.3 02/13/2020 0821   PROT 6.5 10/17/2018 0922   ALBUMIN 4.3 02/13/2020 0821   ALBUMIN 4.5 10/17/2018 0922   AST 20 02/13/2020 0821   ALT 21 02/13/2020 0821   ALKPHOS 46 02/13/2020 0821   BILITOT 0.8 02/13/2020 0821   GFRNONAA >60 02/13/2020 0821   GFRAA 81 10/17/2018 0922    No results found for: TOTALPROTELP, ALBUMINELP, A1GS, A2GS, BETS, BETA2SER, GAMS, MSPIKE, SPEI  Lab Results  Component Value Date   WBC 6.7 02/13/2020   NEUTROABS 4.2 02/13/2020   HGB 14.3 02/13/2020   HCT 42.9 02/13/2020   MCV 93.3 02/13/2020   PLT 231 02/13/2020    No results found for: LABCA2  No components found for: RKYHCW237  No results for input(s): INR in the last 168 hours.  No results found for: LABCA2  No results found for: SEG315  No  results found for: VVO160  No results found for: VPX106  No results found for: CA2729  No components found for: HGQUANT  No results found for: CEA1 / No results found for: CEA1   No results found for: AFPTUMOR  No results found for: CHROMOGRNA  No results found for: KPAFRELGTCHN, LAMBDASER, KAPLAMBRATIO (kappa/lambda light chains)  No results found for: HGBA, HGBA2QUANT, HGBFQUANT, HGBSQUAN (Hemoglobinopathy evaluation)   No results found for: LDH  No results found for: IRON, TIBC, IRONPCTSAT (Iron and TIBC)  No results found for: FERRITIN  Urinalysis    Component Value Date/Time   BILIRUBINUR n 08/22/2015 1436   PROTEINUR n 08/22/2015 1436   UROBILINOGEN negative 08/22/2015 1436   NITRITE n 08/22/2015 1436   LEUKOCYTESUR Negative 08/22/2015 1436    STUDIES: US BREAST LTD UNI LEFT INC AXILLA  Result Date: 01/23/2020 CLINICAL DATA:  49 year old female presenting with a new lump in the left breast for approximately two weeks. EXAM: DIGITAL DIAGNOSTIC LEFT MAMMOGRAM WITH CAD AND TOMO ULTRASOUND LEFT BREAST COMPARISON:  Previous exam(s). ACR Breast Density Category b: There are scattered areas of fibroglandular density. FINDINGS: Mammogram: A skin BB marks the palpable site of concern reported by the patient in the upper outer left breast. A spot tangential tomosynthesis view was performed in addition to standard views. At the palpable site of concern there is a small mass with distortion that measures approximately 0.9 cm. A spot compression tomosynthesis view was performed for a possible asymmetry in the far posterior slightly outer left breast which did not persist on the spot imaging. Mammographic images were processed with CAD. On physical exam, I palpate a small discrete mass at the site of concern reported by the patient in the upper outer left breast. Ultrasound: Targeted ultrasound is performed in the left breast at 2 o'clock 1 cm from the nipple demonstrating an  irregular hypoechoic mass extending to the skin measuring approximately 1.0 x 1.3 x 0.8 cm. The mass demonstrates internal vascularity. Targeted ultrasound of the left axilla demonstrates one lymph node with borderline cortical thickening measuring 0.4 cm. IMPRESSION: 1. At the palpable site of concern in the left breast at 2 o'clock there is a suspicious mass measuring 1.3 cm. The mass extends to the skin. 2.  Borderline abnormal left axillary lymph node. RECOMMENDATION: Ultrasound-guided core needle biopsy of the left breast mass at 2 o'clock and of the borderline abnormal left axillary lymph node. I have discussed the findings and recommendations with the patient. If applicable, a reminder letter will be sent to the patient regarding the next appointment. BI-RADS CATEGORY  4: Suspicious. Electronically Signed   By: Audie Pinto M.D.   On: 01/23/2020 14:44   MM DIAG BREAST TOMO UNI LEFT  Result Date: 01/23/2020 CLINICAL DATA:  49 year old female presenting with a new lump in the left breast for approximately two weeks. EXAM: DIGITAL DIAGNOSTIC LEFT MAMMOGRAM WITH CAD AND TOMO ULTRASOUND LEFT BREAST COMPARISON:  Previous exam(s). ACR Breast Density Category b: There are scattered areas of fibroglandular density. FINDINGS: Mammogram: A skin BB marks the palpable site of concern reported by the patient in the upper outer left breast. A spot tangential tomosynthesis view was performed in addition to standard views. At the palpable site of concern there is a small mass with distortion that measures approximately 0.9 cm. A spot compression tomosynthesis view was performed for a possible asymmetry in the far posterior slightly outer left breast which did not persist on the spot imaging. Mammographic images were processed with CAD. On physical exam, I palpate a small discrete mass at the site of concern reported by the patient in the upper outer left breast. Ultrasound: Targeted ultrasound is performed in the  left breast at 2 o'clock 1 cm from the nipple demonstrating an irregular hypoechoic mass extending to the skin measuring approximately 1.0 x 1.3 x 0.8 cm. The mass demonstrates internal vascularity. Targeted ultrasound of the left axilla demonstrates one lymph node with borderline cortical thickening measuring 0.4 cm. IMPRESSION: 1. At the palpable site of concern in the left breast at 2 o'clock there is a suspicious mass measuring 1.3 cm. The mass extends to the skin. 2.  Borderline abnormal left axillary lymph node. RECOMMENDATION: Ultrasound-guided core needle biopsy of the left breast mass at 2 o'clock and of the borderline abnormal left axillary lymph node. I have discussed the findings and recommendations with the patient. If applicable, a reminder letter will be sent to the patient regarding the next appointment. BI-RADS CATEGORY  4: Suspicious. Electronically Signed   By: Audie Pinto M.D.   On: 01/23/2020 14:44   [place] AXILLARY NODE CORE BIOPSY LEFT  Addendum Date: 02/05/2020   ADDENDUM REPORT: 02/05/2020 15:26 ADDENDUM: Pathology revealed GRADE II INVASIVE DUCTAL CARCINOMA of the LEFT breast mass 2:00. This was found to be concordant by Dr. Ammie Ferrier. 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 This was found to be concordant by Dr. Ammie Ferrier. Pathology results were discussed with the patient and her husband by telephone. The patient reported doing well after the biopsies with tenderness at the sites. Post biopsy instructions and care were reviewed and questions were answered. The patient was encouraged to call [facility] for any additional concerns. The patient was referred to [facility] on February 13, 2020. Pathology results reported by Stacie Acres RN on 02/05/2020. Electronically Signed   By: Ammie Ferrier M.D.   On: 02/05/2020 15:26   Result  Date: 02/05/2020 CLINICAL DATA:  49 year old female presenting for ultrasound-guided biopsy of a left breast mass and left axillary lymph node. EXAM: ULTRASOUND GUIDED LEFT BREAST CORE NEEDLE BIOPSY COMPARISON:  Previous exam(s). PROCEDURE: I met with the patient and we discussed the procedure of ultrasound-guided biopsy, including benefits and alternatives. We discussed the high likelihood of a successful procedure. We discussed the risks of the procedure, including infection, bleeding, tissue injury, clip migration, and inadequate sampling. Informed written consent was given. The usual time-out protocol was performed immediately prior to the procedure. #1 Lesion quadrant:  Upper outer quadrant Using sterile technique and 1% Lidocaine as local anesthetic, under direct ultrasound visualization, a 14 gauge spring-loaded device was used to perform biopsy of a mass in the left breast at 2 o'clock using a lateral approach. At the conclusion of the procedure a ribbon shaped tissue marker clip was deployed into the biopsy cavity. -------------------------------------------------------------------------------------------------------------------------------------------- #2 Lesion quadrant: Left axilla Using sterile technique and 1% Lidocaine as local anesthetic, under direct ultrasound visualization, a 14 gauge spring-loaded device was used to perform biopsy of a left axillary lymph node using a lateral approach. At the conclusion of the procedure a HydroMARK spiral shaped tissue marker clip was deployed into the biopsy cavity. Follow up 2 view mammogram was performed and dictated separately. IMPRESSION: 1. Ultrasound guided biopsy of a left breast mass at 2 o'clock. No apparent complications. 2. Ultrasound guided biopsy of a left axillary lymph node. No apparent complications. Electronically Signed: By: Ammie Ferrier M.D. On: 02/04/2020 08:45   MM CLIP PLACEMENT LEFT  Result Date: 02/04/2020 CLINICAL DATA:  Post  biopsy mammogram of the left breast for clip placement. EXAM: DIAGNOSTIC LEFT MAMMOGRAM POST ULTRASOUND BIOPSY COMPARISON:  Previous exam(s). FINDINGS: Mammographic images were obtained following ultrasound guided biopsy of a mass in the left breast at 2 o'clock and a left axillary lymph node. The biopsy marking clips are in expected position at the sites of biopsy. IMPRESSION: 1. Appropriate positioning of the ribbon shaped biopsy marking clip at the site of biopsy in the left breast at 2 o'clock. 2. Appropriate positioning of the [facility] biopsy marking clip in the left axilla. Final Assessment: Post Procedure Mammograms for Marker Placement Electronically Signed   By: Ammie Ferrier M.D.   On: 02/04/2020 08:55   [place] LT BREAST BX W LOC DEV 1ST LESION IMG BX SPEC US GUIDE  Addendum Date: 02/05/2020   ADDENDUM REPORT: 02/05/2020 15:26 ADDENDUM: Pathology revealed GRADE II INVASIVE DUCTAL CARCINOMA of the LEFT breast mass 2:00. This was found to be concordant by Dr. Ammie Ferrier. 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 This was found to be concordant by Dr. Ammie Ferrier. Pathology results were discussed with the patient and her husband by telephone. The patient reported doing well after the biopsies with tenderness at the sites. Post biopsy instructions and care were reviewed and questions were answered. The patient was encouraged to call [facility] for any additional concerns. The patient was referred to [facility] on February 13, 2020. Pathology results reported by Stacie Acres RN on 02/05/2020. Electronically Signed   By: Ammie Ferrier M.D.   On: 02/05/2020 15:26   Result Date: 02/05/2020 CLINICAL DATA:  49 year old female presenting for ultrasound-guided biopsy of a left breast mass and left axillary lymph node. EXAM: ULTRASOUND GUIDED LEFT BREAST CORE  NEEDLE BIOPSY COMPARISON:  Previous exam(s). PROCEDURE: I met with the patient and we discussed the procedure of ultrasound-guided biopsy, including benefits and alternatives. We discussed the high likelihood of a successful procedure. We discussed the risks of the procedure, including infection, bleeding, tissue injury, clip migration, and inadequate sampling. Informed written consent was given. The usual time-out protocol was performed immediately prior to the procedure. #1 Lesion quadrant: Upper outer quadrant Using sterile technique and 1% Lidocaine as local anesthetic, under direct ultrasound visualization, a 14 gauge spring-loaded device was used to perform biopsy of a mass in the left breast at 2 o'clock using a  lateral approach. At the conclusion of the procedure a ribbon shaped tissue marker clip was deployed into the biopsy cavity. -------------------------------------------------------------------------------------------------------------------------------------------- #2 Lesion quadrant: Left axilla Using sterile technique and 1% Lidocaine as local anesthetic, under direct ultrasound visualization, a 14 gauge spring-loaded device was used to perform biopsy of a left axillary lymph node using a lateral approach. At the conclusion of the procedure a HydroMARK spiral shaped tissue marker clip was deployed into the biopsy cavity. Follow up 2 view mammogram was performed and dictated separately. IMPRESSION: 1. Ultrasound guided biopsy of a left breast mass at 2 o'clock. No apparent complications. 2. Ultrasound guided biopsy of a left axillary lymph node. No apparent complications. Electronically Signed: By: Ammie Ferrier M.D. On: 02/04/2020 08:45     ELIGIBLE FOR AVAILABLE RESEARCH PROTOCOL: AET  ASSESSMENT: 49 y.o. [place] woman status post left breast and left axillary lymph node biopsy 02/04/2020 for a clinical T1c N1, stage IB invasive ductal carcinoma, grade 2, estrogen and progesterone  receptor positive, HER-2 not amplified, with an MIB-1 of 30%   (1) definitive surgery pending  (2) MammaPrint to be obtained from the left breast biopsy sample  (3) genetics  (4) adjuvant radiation  (5) antiestrogens  PLAN: I met today with Caelan to review her new diagnosis. Specifically we discussed the biology of her breast cancer, its diagnosis, staging, treatment  options and prognosis. We first reviewed the fact that cancer is not one disease but more than 100 different diseases and that it is important to keep them separate-- otherwise when friends and relatives discuss their own cancer experiences with Alayha confusion can result. Similarly we explained that if breast cancer spreads to the bone or liver, the patient would not have bone cancer or liver cancer, but breast cancer in the bone and breast cancer in the liver: one cancer in three places-- not 3 different cancers which otherwise would have to be treated in 3 different ways.  We discussed the difference between local and systemic therapy. In terms of loco-regional treatment, lumpectomy plus radiation is equivalent to mastectomy as far as survival is concerned. For this reason, and because the cosmetic results are generally superior, we recommend breast conserving surgery.  We then discussed the rationale for systemic therapy. There is some risk that this cancer may have already spread to other parts of her body. Patients frequently ask at this point about bone scans, CAT scans and PET scans to find out if they have occult breast cancer somewhere else. The problem is that in early stage disease we are much more likely to find false positives then true cancers and this would expose the patient to unnecessary procedures as well as unnecessary radiation. Scans cannot answer the question the patient really would like to know, which is whether she has microscopic disease elsewhere in her body. For those reasons we do not recommend  them.  Of course we would proceed to aggressive evaluation of any symptoms that might suggest metastatic disease, but that is not the case here.  Next we went over the options for systemic therapy which are anti-estrogens, anti-HER-2 immunotherapy, and chemotherapy. Dyesha does not meet criteria for anti-HER-2 immunotherapy. She is a good candidate for anti-estrogens.  The question of chemotherapy is more complicated. Chemotherapy is most effective in rapidly growing, aggressive tumors.  The effectiveness of chemotherapy is much less clear and intermediate grade not so fast growing tumors like Tanayah 's. For that reason we are going to request a MammaPrint test from the  initial biopsy, as suggested by NCCN guidelines. That will help [place] make a definitive decision regarding chemotherapy in this case.  Lillybeth does qualify for genetics testing. In patients who carry a deleterious mutation [for example in a  BRCA gene], the risk of a new breast cancer developing in the future may be sufficiently great that the patient may choose bilateral mastectomies. However if she wishes to keep her breasts in that situation it is safe to do so. That would require intensified screening, which generally means not only yearly mammography but a yearly breast MRI as well.   Keon has a good understanding of the overall plan. She agrees with it. She knows the goal of treatment in her case is cure. She will call with any problems that may develop before her next visit here.  Total encounter time 65 minutes.Sarajane Jews C. Aoki Wedemeyer, MD 02/13/2020 11:38 AM Medical Oncology and Hematology [facility] [place], [place] [address] Tel. [phone]    Fax. [phone]   This document serves as a record of services personally performed by Lurline Del, MD. It was created on his behalf by Wilburn Mylar, a trained medical scribe. The creation of this record is based on the scribe's personal  observations and the provider's statements to them.   I, Lurline Del MD, have reviewed the above documentation for accuracy and completeness, and I agree with the above.    *Total Encounter Time as defined by the Centers for Medicare and Medicaid Services includes, in addition to the face-to-face time of a patient visit (documented in the note above) non-face-to-face time: obtaining and reviewing outside history, ordering and reviewing medications, tests or procedures, care coordination (communications with other health care professionals or caregivers) and documentation in the medical record.

## 2020-02-13 ENCOUNTER — Ambulatory Visit
Admission: RE | Admit: 2020-02-13 | Discharge: 2020-02-13 | Disposition: A | Discharge status: Home / Self Care | Payer: 59 | Source: Ambulatory Visit | Attending: Radiation Oncology | Admitting: Radiation Oncology

## 2020-02-13 ENCOUNTER — Encounter: Payer: Self-pay | Admitting: Oncology

## 2020-02-13 ENCOUNTER — Encounter: Payer: Self-pay | Admitting: Genetic Counselor

## 2020-02-13 ENCOUNTER — Inpatient Hospital Stay: Payer: 59

## 2020-02-13 ENCOUNTER — Encounter: Payer: Self-pay | Admitting: Addiction (Substance Use Disorder)

## 2020-02-13 ENCOUNTER — Telehealth: Payer: Self-pay | Admitting: *Deleted

## 2020-02-13 ENCOUNTER — Ambulatory Visit: Payer: Self-pay | Admitting: Surgery

## 2020-02-13 ENCOUNTER — Encounter: Payer: Self-pay | Admitting: Physical Therapy

## 2020-02-13 ENCOUNTER — Other Ambulatory Visit: Payer: Self-pay

## 2020-02-13 ENCOUNTER — Encounter: Payer: Self-pay | Admitting: *Deleted

## 2020-02-13 ENCOUNTER — Ambulatory Visit (HOSPITAL_BASED_OUTPATIENT_CLINIC_OR_DEPARTMENT_OTHER): Payer: 59 | Admitting: Genetic Counselor

## 2020-02-13 ENCOUNTER — Inpatient Hospital Stay: Payer: 59 | Attending: Oncology | Admitting: Oncology

## 2020-02-13 ENCOUNTER — Encounter: Payer: 59 | Admitting: Licensed Clinical Social Worker

## 2020-02-13 ENCOUNTER — Ambulatory Visit: Payer: 59 | Attending: Surgery | Admitting: Physical Therapy

## 2020-02-13 VITALS — BP 123/65 | HR 78 | Temp 97.5°F | Resp 18 | Ht 62.75 in | Wt 153.5 lb

## 2020-02-13 DIAGNOSIS — Z170 Estrogen receptor positive status [ER+]: Secondary | ICD-10-CM | POA: Insufficient documentation

## 2020-02-13 DIAGNOSIS — F418 Other specified anxiety disorders: Secondary | ICD-10-CM | POA: Insufficient documentation

## 2020-02-13 DIAGNOSIS — Z803 Family history of malignant neoplasm of breast: Secondary | ICD-10-CM | POA: Diagnosis not present

## 2020-02-13 DIAGNOSIS — E039 Hypothyroidism, unspecified: Secondary | ICD-10-CM | POA: Diagnosis not present

## 2020-02-13 DIAGNOSIS — C50412 Malignant neoplasm of upper-outer quadrant of left female breast: Secondary | ICD-10-CM | POA: Insufficient documentation

## 2020-02-13 DIAGNOSIS — Z8042 Family history of malignant neoplasm of prostate: Secondary | ICD-10-CM

## 2020-02-13 DIAGNOSIS — Z800 Family history of malignant neoplasm of digestive organs: Secondary | ICD-10-CM | POA: Insufficient documentation

## 2020-02-13 DIAGNOSIS — Z1379 Encounter for other screening for genetic and chromosomal anomalies: Secondary | ICD-10-CM | POA: Diagnosis not present

## 2020-02-13 DIAGNOSIS — Z79899 Other long term (current) drug therapy: Secondary | ICD-10-CM | POA: Insufficient documentation

## 2020-02-13 DIAGNOSIS — C50912 Malignant neoplasm of unspecified site of left female breast: Secondary | ICD-10-CM

## 2020-02-13 HISTORY — DX: Family history of malignant neoplasm of breast: Z80.3

## 2020-02-13 LAB — CBC WITH DIFFERENTIAL (CANCER CENTER ONLY)
Abs Immature Granulocytes: 0.02 10*3/uL (ref 0.00–0.07)
Basophils Absolute: 0.1 10*3/uL (ref 0.0–0.1)
Basophils Relative: 1 %
Eosinophils Absolute: 0.1 10*3/uL (ref 0.0–0.5)
Eosinophils Relative: 1 %
HCT: 42.9 % (ref 36.0–46.0)
Hemoglobin: 14.3 g/dL (ref 12.0–15.0)
Immature Granulocytes: 0 %
Lymphocytes Relative: 27 %
Lymphs Abs: 1.8 10*3/uL (ref 0.7–4.0)
MCH: 31.1 pg (ref 26.0–34.0)
MCHC: 33.3 g/dL (ref 30.0–36.0)
MCV: 93.3 fL (ref 80.0–100.0)
Monocytes Absolute: 0.5 10*3/uL (ref 0.1–1.0)
Monocytes Relative: 8 %
Neutro Abs: 4.2 10*3/uL (ref 1.7–7.7)
Neutrophils Relative %: 63 %
Platelet Count: 231 10*3/uL (ref 150–400)
RBC: 4.6 MIL/uL (ref 3.87–5.11)
RDW: 12.5 % (ref 11.5–15.5)
WBC Count: 6.7 10*3/uL (ref 4.0–10.5)
nRBC: 0 % (ref 0.0–0.2)

## 2020-02-13 LAB — CMP (CANCER CENTER ONLY)
ALT: 21 U/L (ref 0–44)
AST: 20 U/L (ref 15–41)
Albumin: 4.3 g/dL (ref 3.5–5.0)
Alkaline Phosphatase: 46 U/L (ref 38–126)
Anion gap: 6 (ref 5–15)
BUN: 15 mg/dL (ref 6–20)
CO2: 31 mmol/L (ref 22–32)
Calcium: 9.4 mg/dL (ref 8.9–10.3)
Chloride: 106 mmol/L (ref 98–111)
Creatinine: 1.03 mg/dL — ABNORMAL HIGH (ref 0.44–1.00)
GFR, Estimated: >60 mL/min (ref >60)
Glucose, Bld: 105 mg/dL — ABNORMAL HIGH (ref 70–99)
Potassium: 3.7 mmol/L (ref 3.5–5.1)
Sodium: 143 mmol/L (ref 135–145)
Total Bilirubin: 0.8 mg/dL (ref 0.3–1.2)
Total Protein: 7.3 g/dL (ref 6.5–8.1)

## 2020-02-13 LAB — GENETIC SCREENING ORDER

## 2020-02-13 NOTE — Progress Notes (Signed)
REFERRING PROVIDER: Chauncey Cruel, MD [address] [place],   [address]  PRIMARY PROVIDER:  Glenis Smoker, MD  PRIMARY REASON FOR VISIT:  1. Malignant neoplasm of upper-outer quadrant of left breast in female, estrogen receptor positive ([place])   2. Family history of prostate cancer   3. Family history of breast cancer   4. Family history of colon cancer     HISTORY OF PRESENT ILLNESS:   Ms. Toutant, a 49 y.o. female, was seen for a [place] cancer genetics consultation during the breast multidisciplinary clinic at the request of Dr. Jana Hakim due to a personal and family history of cancer.  Ms. Holtmeyer presents to clinic today with her husband, Letitia Caul, to discuss the possibility of a hereditary predisposition to cancer, to discuss genetic testing, and to further clarify her future cancer risks, as well as potential cancer risks for family members.   In 2021, at the age of 85, Ms. Jalbert was diagnosed with invasive ductalcaricoma of the left breast. The preliminary treatment plan includes surgery, MammaPrint, adjuvant radiation, and anti-estrogens.   RISK FACTORS:  Menarche was at age 4.  Nulliparous.  OCP use for approximately 8 years.  Ovaries intact: yes.  Hysterectomy: no.  Menopausal status: postmenopausal.  HRT use: 11 years. Colonoscopy: no; not examined. Mammogram within the last year: yes. Up to date with pelvic exams: yes.  Past Medical History:  Diagnosis Date  . Anxiety   . Breast cancer ([place])   . Depression   . Family history of breast cancer 02/13/2020  . Hormone disorder   . Hypothyroidism   . Premature ovarian failure    Dx'd age 51  . Thyroid disease    hypothyroid    Past Surgical History:  Procedure Laterality Date  . DILATATION & CURETTAGE/HYSTEROSCOPY WITH MYOSURE N/A 10/07/2015   Procedure: DILATATION & CURETTAGE/HYSTEROSCOPY WITH MYOSURE;  Surgeon: Nunzio Cobbs, MD;  Location: [facility];  Service:  Gynecology;  Laterality: N/A;  endometrial polyp  . WISDOM TOOTH EXTRACTION      Social History   Socioeconomic History  . Marital status: Married    Spouse name: Not on file  . Number of children: Not on file  . Years of education: Not on file  . Highest education level: Not on file  Occupational History  . Not on file  Tobacco Use  . Smoking status: Never Smoker  . Smokeless tobacco: Never Used  Vaping Use  . Vaping Use: Never used  Substance and Sexual Activity  . Alcohol use: Yes    Alcohol/week: 2.0 standard drinks    Types: 2 Glasses of wine per week    Comment: one drink a week  . Drug use: No  . Sexual activity: Yes    Partners: Female, Female    Birth control/protection: Post-menopausal, None  Other Topics Concern  . Not on file  Social History Narrative  . Not on file   Social Determinants of Health   Financial Resource Strain: Not on file  Food Insecurity: Not on file  Transportation Needs: Not on file  Physical Activity: Not on file  Stress: Not on file  Social Connections: Not on file     FAMILY HISTORY:  We obtained a detailed, 4-generation family history.  Significant diagnoses are listed below: Family History  Problem Relation Age of Onset  . Breast cancer Other        MGF's niece; dx 60s  . Colon cancer Other  MGF's brother; dx 73s  . Prostate cancer Cousin 54       maternal cousin     Ms. Furnari has one brother, age 4, and one sister, age 34, both without a cancer history.  Ms. Fairbairn mother is 63 years old and has not had cancer.  Ms. Lippert maternal cousin was diagnosed with prostate cancer at age 87.  Ms. Wann has distant maternal relatives with breast cancer (dx 75s) and colon cancer (dx 38).  No other maternal family history of cancer was reported.  Ms. Weekes father is 43 years old and has not had cancer.  No paternal family history of cancer was reported.   Ms. Lax is unaware of  previous family history of genetic testing for hereditary cancer risks. Patient's maternal ancestors are of [place] descent, and paternal ancestors are of [place] descent. There is no reported Ashkenazi Jewish ancestry. There is no known consanguinity.  GENETIC COUNSELING ASSESSMENT: Ms. Cerritos is a 49 y.o. female with a personal and family history of cancer which is somewhat suggestive of a hereditary cancer syndrome and predisposition to cancer given her age of diagnosis and presence of related cancers in the family. We, therefore, discussed and recommended the following at today's visit.   DISCUSSION: We discussed that 5 - 10% of cancer is hereditary, with most cases of hereditary breast cancer associated with mutations in BRCA1/2.  There are other genes that can be associated with hereditary breast cancer syndromes.  Type of cancer risk and level of risk are gene-specific.  We discussed that testing is beneficial for several reasons including knowing how to follow individuals after completing their treatment, identifying whether potential treatment options would be beneficial, and understanding if other family members could be at risk for cancer and allowing them to undergo genetic testing.   We reviewed the characteristics, features and inheritance patterns of hereditary cancer syndromes. We also discussed genetic testing, including the appropriate family members to test, the process of testing, insurance coverage and turn-around-time for results. We discussed the implications of a negative, positive and/or variant of uncertain significant result. In order to get genetic test results in a timely manner so that Ms. Pamer can use these genetic test results for surgical decisions, we recommended Ms. Bischoff pursue genetic testing for the [facility] BRCAPlus Panel.  The BRCAplus panel offered by [employer] and includes sequencing and deletion/duplication analysis for the following 8 genes:  ATM, BRCA1, BRCA2, CDH1, CHEK2, PALB2, PTEN, and TP53.  Once complete, we recommend Ms. Marquart pursue reflex genetic testing to a more comprehensive gene panel.   Ms. Molter  was offered a common hereditary cancer panel and an expanded pan-cancer panel. Ms. Desaulniers was informed of the benefits and limitations of each panel, including that expanded pan-cancer panels contain several genes that do not have clear management guidelines at this point in time.  We also discussed that as the number of genes included on a panel increases, the chances of variants of uncertain significance increases.  After considering the benefits and limitations of each gene panel, Ms. Mosso  elected to have an expanded pan-cancer panel through [facility].  The CancerNext-Expanded gene panel offered by [facility] and includes sequencing, rearrangement, and RNA analysis for the following 77 genes: AIP, ALK, APC, ATM, AXIN2, BAP1, BARD1, BLM, BMPR1A, BRCA1, BRCA2, BRIP1, CDC73, CDH1, CDK4, CDKN1B, CDKN2A, CHEK2, CTNNA1, DICER1, FANCC, FH, FLCN, GALNT12, KIF1B, LZTR1, MAX, MEN1, MET, MLH1, MSH2, MSH3, MSH6, MUTYH, NBN, NF1, NF2, NTHL1, PALB2, PHOX2B, PMS2, POT1,  PRKAR1A, PTCH1, PTEN, RAD51C, RAD51D, RB1, RECQL, RET, SDHA, SDHAF2, SDHB, SDHC, SDHD, SMAD4, SMARCA4, SMARCB1, SMARCE1, STK11, SUFU, TMEM127, TP53, TSC1, TSC2, VHL and XRCC2 (sequencing and deletion/duplication); EGFR, EGLN1, HOXB13, KIT, MITF, PDGFRA, POLD1, and POLE (sequencing only); EPCAM and GREM1 (deletion/duplication only).   Based on Ms. Shorb personal history of breast cancer before the age of 30 and family history of prostate cancer, she meets medical criteria for genetic testing. Despite that she meets criteria, she may still have an out of pocket cost. We discussed that if her out of pocket cost for testing is over $100, the laboratory will call and confirm whether she wants to proceed with testing.  If the out of pocket cost of testing  is less than $100 she will be billed by the genetic testing laboratory.   PLAN: After considering the risks, benefits, and limitations, Ms. Minnich provided informed consent to pursue genetic testing and the blood sample was sent to [facility] for analysis of the [place]. Results should be available within approximately 1-2 weeks' time, at which point they will be disclosed by telephone to Ms. Eutsler, as will any additional recommendations warranted by these results. Ms. Gramlich will receive a summary of her genetic counseling visit and a copy of her results once available. This information will also be available in Epic.   Lastly, we encouraged Ms. Koogler to remain in contact with cancer genetics annually so that we can continuously update the family history and inform her of any changes in cancer genetics and testing that may be of benefit for this family.   Ms. Raz questions were answered to her satisfaction today. Our contact information was provided should additional questions or concerns arise. Thank you for the referral and allowing [place] to share in the care of your patient.   Yamila Cragin M. Joette Catching, [place], [facility] Genetic Counselor [email or web address] (P) [phone]  The patient was seen for a total of 15 minutes in face-to-face genetic counseling.  Drs. Magrinat, Lindi Adie and/or Burr Medico were available to discuss this case as needed.   _______________________________________________________________________ For Office Staff:  Number of people involved in session: 1 Was an Intern/ student involved with case: no

## 2020-02-13 NOTE — Telephone Encounter (Signed)
Received order for Mammaprint testing on [facility] bx. Requisition faxed to [place]

## 2020-02-13 NOTE — Patient Instructions (Signed)

## 2020-02-13 NOTE — Progress Notes (Signed)
      [facility] Counselor/Therapist Progress Note  Patient ID: Michaela Roman, MRN: [id number],    Date: 02/13/2020  Time Spent:  68mins  Treatment Type: Individual Therapy  Reported Symptoms: in shock and angry.  Mental Status Exam:  Appearance:   Casual and Fairly Groomed     Behavior:  Appropriate and Sharing  Motor:  Normal  Speech/Language:   Clear and Coherent and Normal Rate  Affect:  Tearful  Mood:  angry, anxious and sad  Thought process:  normal  Thought content:    Rumination  Sensory/Perceptual disturbances:    WNL  Orientation:  x4  Attention:  Good  Concentration:  Good  Memory:  WNL  Fund of knowledge:   Good  Insight:    Good  Judgment:   Good  Impulse Control:  Good   Risk Assessment: Danger to Self:  No. Denied.  Self-injurious Behavior: No Danger to Others: No Duty to Warn:no Physical Aggression / Violence:No  Access to Firearms a concern: No  Gang Involvement:No   Subjective: Client reported being in shock after finding out last week that she had breast cancer. Client processed the anger and fear surrounding the diagnosis and the frustration about others' responses to her when they heard. Therapist used MI & grief therapy to support and reasure her of her strengths and help her grieve the loss of control and loss of her life as she knew it (something that she was the most frustrated about). Client identified frustration being her defense mechanisms when she is feeling very sad/ despair. Therapist offered encouragement as client takes next steps. Therapist assessed client's safety and client denied SI/HI/AVH.   Interventions: Motivational Interviewing and Grief Therapy  Diagnosis:   ICD-10-CM   1. GAD (generalized anxiety disorder)  F41.1   2. Adjustment disorder with mixed anxiety and depressed mood  F43.23      Plan of Care: Client to return for weekly therapy with Sammuel Cooper, therapist, to review again in 6 months. Client is to  continue seeing medication provider for support of mood management.   Client to engage in CBT: challenging negative internal ruminationsand self-talk AEB journaling daily or expressing thoughts to support persons in their life and then challenging it with truth.  Client to engage in tapping to tap in healthy cognition that challenges negative rumination or deep negative core belief.  Client to practice DBT distress tolerance skills to decrease crying spells and thoughts of not being able to endure their suffering AEB using mindfulness, deep breathing, and TIP to increase tolerance for discomfort, discharge emotional distress, and increase their understanding that they can do hard things.  Client to utilize BSP (brainspotting) with therapist to help client identify and process triggers for their depressive episode and anxiety with goal of reducing said SUDs caused by depression/anxiety by 33% in the next 6 months. Client to prioritize sleep 8+ hours each week night AEB going to bed by 10pm each night.  Barnie Del, LCSW, LCAS, CCTP, CCS-I, BSP

## 2020-02-13 NOTE — Progress Notes (Unsigned)
[facility] Work  Initial Assessment   Michaela Roman is a 49 y.o. year old female accompanied by patient and spouse, Letitia Caul Futures trader). Clinical Social Work was referred by [facility] for assessment of psychosocial needs.   SDOH (Social Determinants of Health) assessments performed: Yes   Distress Screen completed: Yes ONCBCN DISTRESS SCREENING 02/13/2020  Screening Type Initial Screening  Distress experienced in past week (1-10) 10  Practical problem type Insurance  Emotional problem type Nervousness/Anxiety;Adjusting to illness;Isolation/feeling alone;Feeling hopeless  Spiritual/Religous concerns type Facing my mortality  Information Concerns Type Lack of info about diagnosis;Lack of info about treatment;Lack of info about complementary therapy choices;Lack of info about maintaining fitness  Physical Problem type Sleep/insomnia;Loss of appetitie      Family/Social Information:  . Housing Arrangement: patient lives with husband, Letitia Caul . Family members/support persons in your life? Locally, husband and friend. Mom, sister, other friends live further away but are good emotional support . Transportation concerns: no  . 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 Income source: Employment . Financial concerns: No o Type of concern: None . Food access concerns: no . Religious or spiritual practice: practices meditation . Services Currently in place:  Sees counselor regularly (last appt 12/14), medication for mood  Coping/ Adjustment to diagnosis: . Patient understands treatment plan and what happens next? yes, still awaiting some information from oncotype and genetic testing but understands plan so far . Concerns about diagnosis and/or treatment: Feelings of anger or sadness and being able to continue activities, like walking/hiking . Patient reported stressors: Anxiety and Adjusting to my illness . Hopes and priorities:  Hopes to be able to be treated and continue to engage in activities (with modifications as needed) . Patient enjoys walking, hiking, knitting, reading. meditation . Current coping skills/ strengths: Capable of independent living, Motivation for treatment/growth and Special hobby/interest    SUMMARY: Current SDOH Barriers:  Marland Kitchen Mental Health Concerns   Clinical Social Work Clinical Goal(s):  Marland Kitchen Patient will continue to utilize her professional supports. She will look into support groups and programs offered at [facility] and [employer]  Interventions: . Discussed common feeling and emotions when being diagnosed with cancer, and the importance of support during treatment . Informed patient of the support team roles and support services at [facility] . Provided CSW contact information and encouraged patient to call with any questions or concerns   Follow Up Plan: Patient will contact CSW with any support or resource needs Patient verbalizes understanding of plan: Yes    Christeen Douglas , LCSW

## 2020-02-13 NOTE — H&P (View-Only) (Signed)
Michaela Roman Appointment: 02/13/2020 9:00 AM Location: [facility] Patient #: [id number] DOB: 1970-04-11 Undefined / Language: [name] / Race: White Female  History of Present Illness [name] A. Anatalia Kronk MD; 02/13/2020 12:01 PM) Patient words: PT presents to the [facility] at the request of Dr Theda Sers of the [facility] for SDM of left breast that shows a 1.3 cm mass UOQ left breast core bx IDC ER/PR pos her 2 neu negative ki 30 %. Pt complains of 1 month hx of mass left breast left axillary LN positive for metastatic disease No pain or discharge right breast normal otherwise.  The patient is a 49 year old female.   Past Surgical History [name], RN; 02/13/2020 8:18 AM) Breast Biopsy Left. Oral Surgery  Diagnostic Studies History [name], RN; 02/13/2020 8:18 AM) Colonoscopy never Mammogram within last year Pap Smear 1-5 years ago  Medication History [name], RN; 02/13/2020 8:18 AM) Medications Reconciled  Social History [name], RN; 02/13/2020 8:18 AM) Alcohol use Occasional alcohol use. Caffeine use Coffee, Tea. Illicit drug use Prefer to discuss with provider. Tobacco use Never smoker.  Family History [name], RN; 02/13/2020 8:18 AM) Alcohol Abuse Father. Depression Mother. Diabetes Mellitus Family Members In General. Heart Disease Family Members In General. Thyroid problems Sister.  Pregnancy / Birth History [name], RN; 02/13/2020 8:18 AM) Age at menarche 41 years. Age of menopause <46 Contraceptive History Oral contraceptives. Gravida 0 Para 0 Regular periods  Other Problems [name], RN; 02/13/2020 8:18 AM) Anxiety Disorder Back Pain Depression Thyroid Disease     Review of Systems (Cleopatra Sardo A. Jenell Dobransky MD; 02/13/2020 12:00 PM) General Not Present- Appetite Loss, Chills, Fatigue, Fever, Night Sweats, Weight Gain and Weight Loss. Skin Not Present- Change in Wart/Mole,  Dryness, Hives, Jaundice, New Lesions, Non-Healing Wounds, Rash and Ulcer. HEENT Present- Wears glasses/contact lenses. Not Present- Earache, Hearing Loss, Hoarseness, Nose Bleed, Oral Ulcers, Ringing in the Ears, Seasonal Allergies, Sinus Pain, Sore Throat, Visual Disturbances and Yellow Eyes. Respiratory Not Present- Bloody sputum, Chronic Cough, Difficulty Breathing, Snoring and Wheezing. Breast Present- Breast Mass. Not Present- Breast Pain, Nipple Discharge and Skin Changes. Cardiovascular Not Present- Chest Pain, Difficulty Breathing Lying Down, Leg Cramps, Palpitations, Rapid Heart Rate, Shortness of Breath and Swelling of Extremities. Gastrointestinal Not Present- Abdominal Pain, Bloating, Bloody Stool, Change in Bowel Habits, Chronic diarrhea, Constipation, Difficulty Swallowing, Excessive gas, Gets full quickly at meals, Hemorrhoids, Indigestion, Nausea, Rectal Pain and Vomiting. Female Genitourinary Not Present- Frequency, Nocturia, Painful Urination, Pelvic Pain and Urgency. Musculoskeletal Not Present- Back Pain, Joint Pain, Joint Stiffness, Muscle Pain, Muscle Weakness and Swelling of Extremities. Neurological Not Present- Decreased Memory, Fainting, Headaches, Numbness, Seizures, Tingling, Tremor, Trouble walking and Weakness. Psychiatric Present- Anxiety, Depression, Fearful and Frequent crying. Not Present- Bipolar and Change in Sleep Pattern. Endocrine Not Present- Cold Intolerance, Excessive Hunger, Hair Changes, Heat Intolerance, Hot flashes and New Diabetes. Hematology Not Present- Blood Thinners, Easy Bruising, Excessive bleeding, Gland problems, HIV and Persistent Infections. All other systems negative   Physical Exam (Coby Shrewsberry A. Lataya Varnell MD; 02/13/2020 12:01 PM)  General Mental Status-Alert. General Appearance-Consistent with stated age. Hydration-Well hydrated. Voice-Normal.  Head and Neck Head-normocephalic, atraumatic with no lesions or palpable  masses. Trachea-midline. Thyroid Gland Characteristics - normal size and consistency.  Chest and Lung Exam Chest and lung exam reveals -quiet, even and easy respiratory effort with no use of accessory muscles and on auscultation, normal breath sounds, no adventitious sounds and normal vocal resonance. Inspection Chest Wall -  Normal. Back - normal.  Breast Note: right breast normal left breast bruising at 2 oclock no palpable axillary adenopathy bilaterally  Cardiovascular Cardiovascular examination reveals -normal heart sounds, regular rate and rhythm with no murmurs and normal pedal pulses bilaterally.  Neurologic Neurologic evaluation reveals -alert and oriented x 3 with no impairment of recent or remote memory. Mental Status-Normal.  Lymphatic Head & Neck  General Head & Neck Lymphatics: Bilateral - Description - Normal. Axillary  General Axillary Region: Bilateral - Description - Normal. Tenderness - Non Tender.    Assessment & Plan (Burel Kahre A. Kerisha Goughnour MD; 02/13/2020 12:08 PM)  BREAST CANCER, STAGE 2, LEFT (C50.912) Impression: discussed left breast lumpectomy targeted lymph node biopsy and left SLN mapping vs mastectomy and reconstruction genetics would be a good NSM candidate but defer to plastics  Wants lumpectomy if genetics negative  plan for breast conservation pending genetics  may need port as well discussed that with her   Pt requires port placement for chemotherapy. Risk include bleeding, infection, pneumothorax, hemothorax, mediastinal injury, nerve injury , blood vessel injury, strke, blood clots, death, migration. embolization and need for additional procedures. Pt agrees to proceed.  Risk of lumpectomy include bleeding, infection, seroma, more surgery, use of seed/wire, wound care, cosmetic deformity and the need for other treatments, death , blood clots, death. Pt agrees to proceed. Risk of sentinel lymph node mapping include bleeding,  infection, lymphedema, shoulder pain. stiffness, dye allergy. cosmetic deformity , blood clots, death, need for more surgery. Pt agrees to proceed.  total time 45 minutes  Current Plans You are being scheduled for surgery- Our schedulers will call you.  You should hear from our office's scheduling department within 5 working days about the location, date, and time of surgery. We try to make accommodations for patient's preferences in scheduling surgery, but sometimes the OR schedule or the surgeon's schedule prevents [place] from making those accommodations.  If you have not heard from our office [phone]) in 5 working days, call the office and ask for your surgeon's nurse.  If you have other questions about your diagnosis, plan, or surgery, call the office and ask for your surgeon's nurse.  Pt Education - [facility] Breast Cancer Information Given - [facility] "Breast Journey" Package We discussed the staging and pathophysiology of breast cancer. We discussed all of the different options for treatment for breast cancer including surgery, chemotherapy, radiation therapy, Herceptin, and antiestrogen therapy. We discussed a sentinel lymph node biopsy as she does not appear to having lymph node involvement right now. We discussed the performance of that with injection of radioactive tracer and blue dye. We discussed that she would have an incision underneath her axillary hairline. We discussed that there is a bout a 10-20% chance of having a positive node with a sentinel lymph node biopsy and we will await the permanent pathology to make any other first further decisions in terms of her treatment. One of these options might be to return to the operating room to perform an axillary lymph node dissection. We discussed about a 1-2% risk lifetime of chronic shoulder pain as well as lymphedema associated with a sentinel lymph node biopsy. We discussed the options for treatment of the breast cancer which included  lumpectomy versus a mastectomy. We discussed the performance of the lumpectomy with a wire placement. We discussed a 10-20% chance of a positive margin requiring reexcision in the operating room. We also discussed that she may need radiation therapy or antiestrogen therapy or both if she  undergoes lumpectomy. We discussed the mastectomy and the postoperative care for that as well. We discussed that there is no difference in her survival whether she undergoes lumpectomy with radiation therapy or antiestrogen therapy versus a mastectomy. There is a slight difference in the local recurrence rate being 3-5% with lumpectomy and about 1% with a mastectomy. We discussed the risks of operation including bleeding, infection, possible reoperation. She understands her further therapy will be based on what her stages at the time of her operation.  Pt Education - [facility] breast cancer surgery: discussed with patient and provided information.

## 2020-02-13 NOTE — Telephone Encounter (Signed)
Per review of Epic, PA for Provera 5mg  tab denied. Patient notified on 01/30/20.   Encounter closed.

## 2020-02-13 NOTE — Progress Notes (Signed)
Radiation Oncology         [phone] ________________________________  Multidisciplinary Breast Oncology Clinic [facility]) Initial Outpatient Consultation  Name: Michaela Roman MRN: [id number]  Date: 02/13/2020  DOB: May 24, 1970  [id number], Anastasia Pall, MD  Erroll Luna, MD   REFERRING PHYSICIAN: Erroll Luna, MD  DIAGNOSIS: The encounter diagnosis was Malignant neoplasm of upper-outer quadrant of left breast in female, estrogen receptor positive ([place]).  Stage T1c, N1, Mx, Left Breast UOQ, Invasive Ductal Carcinoma, ER+ / PR+ / Her2-, Grade 2    ICD-10-CM   1. Malignant neoplasm of upper-outer quadrant of left breast in female, estrogen receptor positive ([place])  C50.412    Z17.0     HISTORY OF PRESENT ILLNESS::Michaela Roman is a 49 y.o. female who is presenting to the office today for evaluation of her newly diagnosed breast cancer. She is accompanied by her husband. She is doing well overall.   She underwent unilateral diagnostic mammography with tomography and left breast ultrasonography at [facility] on 01/23/2020 to evaluate a 4-week history of palpable left breast mass. Results showed a suspicious mass that measured 1.3 cm at the palpable site of concern at the 2 'clock position. The mass was noted to extend to the skin. There was also a borderline abnormal left axillary lymph node.  Biopsy on 02/04/2020 showed grade 2 invasive ductal carcinoma with a metastatic left axillary lymph node. Prognostic indicators were significant for: estrogen receptor, 80% positive and progesterone receptor, 80% positive, both with moderate-strong staining intensities. Proliferation marker Ki67 at 30%. HER2 negative.  Menarche: 49 years old Age at first live birth: N/A GP: 0 LMP: 2015 Contraceptive: Yes, from age 4 to 89 HRT: Yes, for 11 years. Stopped on 02/05/2020   The patient was referred today for presentation in the multidisciplinary conference.  Radiology  studies and pathology slides were presented there for review and discussion of treatment options.  A consensus was discussed regarding potential next steps.  PREVIOUS RADIATION THERAPY: No  PAST MEDICAL HISTORY:  Past Medical History:  Diagnosis Date  . Anxiety   . Breast cancer ([place])   . Depression   . Hormone disorder   . Hypothyroidism   . Premature ovarian failure    Dx'd age 33  . Thyroid disease    hypothyroid    PAST SURGICAL HISTORY: Past Surgical History:  Procedure Laterality Date  . DILATATION & CURETTAGE/HYSTEROSCOPY WITH MYOSURE N/A 10/07/2015   Procedure: DILATATION & CURETTAGE/HYSTEROSCOPY WITH MYOSURE;  Surgeon: Nunzio Cobbs, MD;  Location: [facility];  Service: Gynecology;  Laterality: N/A;  endometrial polyp  . WISDOM TOOTH EXTRACTION      FAMILY HISTORY:  Family History  Problem Relation Age of Onset  . Thyroid disease Sister        hypothyroidism  . Hypertension Maternal Grandmother   . Heart disease Maternal Grandmother   . Depression Maternal Grandmother   . Diabetes Maternal Grandfather   . Other Maternal Grandfather        benign brain tumor  . Depression Mother   . Multiple sclerosis Father   . Dementia Paternal Grandmother   . Alcohol abuse Paternal Grandfather   . Colon cancer Maternal Uncle   . Breast cancer Cousin     SOCIAL HISTORY:  Social History   Socioeconomic History  . Marital status: Married    Spouse name: Not on file  . Number of children: Not on file  . Years of education: Not on file  .  Highest education level: Not on file  Occupational History  . Not on file  Tobacco Use  . Smoking status: Never Smoker  . Smokeless tobacco: Never Used  Vaping Use  . Vaping Use: Never used  Substance and Sexual Activity  . Alcohol use: Yes    Alcohol/week: 2.0 standard drinks    Types: 2 Glasses of wine per week    Comment: one drink a week  . Drug use: No  . Sexual activity: Yes    Partners: Female, Female    Birth  control/protection: Post-menopausal, None  Other Topics Concern  . Not on file  Social History Narrative  . Not on file   Social Determinants of Health   Financial Resource Strain: Not on file  Food Insecurity: Not on file  Transportation Needs: Not on file  Physical Activity: Not on file  Stress: Not on file  Social Connections: Not on file    ALLERGIES:  Allergies  Allergen Reactions  . Sertraline Hives    MEDICATIONS:  Current Outpatient Medications  Medication Sig Dispense Refill  . calcium gluconate 500 MG tablet Take 1 tablet by mouth daily.    Marland Kitchen escitalopram (LEXAPRO) 5 MG tablet Take 1 tablet (5 mg total) by mouth daily. 90 tablet 1  . levothyroxine (SYNTHROID) 112 MCG tablet TAKE 1 TABLET BY MOUTH ONCE DAILY BEFORE BREAKFAST 90 tablet 3  . Multiple Vitamins-Minerals (MULTIVITAMIN PO) Take 1 tablet by mouth daily.     No current facility-administered medications for this encounter.    REVIEW OF SYSTEMS: A 10+ POINT REVIEW OF SYSTEMS WAS OBTAINED including neurology, dermatology, psychiatry, cardiac, respiratory, lymph, extremities, GI, GU, musculoskeletal, constitutional, reproductive, HEENT. On the provided form, she reports loss of sleep, wearing contacts and glasses, lump in left breast, dimpling of left breast, psoriasis, anxiety, and depression. She denies chest pain, shortness of breath, cough, abdominal pain, nausea, vomiting, diarrhea, dysuria, and any other symptoms.    PHYSICAL EXAM:   Vitals with BMI 02/13/2020  Height 5' 2.75"  Weight 153 lbs 8 oz  BMI 56.3  Systolic 875  Diastolic 65  Pulse 78   Lungs are clear to auscultation bilaterally. Heart has regular rate and rhythm. No palpable cervical, supraclavicular, or axillary adenopathy. Abdomen soft, non-tender, normal bowel sounds. Right breast with no palpable mass, nipple discharge, or bleeding.  Left breast with bruising in the upper outer quadrant near the areolar border. There is also some  palpable induration in that area that measured approximately 1.5 cm. No nipple discharge or bleeding.   KPS = 100  100 - Normal; no complaints; no evidence of disease. 90   - Able to carry on normal activity; minor signs or symptoms of disease. 80   - Normal activity with effort; some signs or symptoms of disease. 14   - Cares for self; unable to carry on normal activity or to do active work. 60   - Requires occasional assistance, but is able to care for most of his personal needs. 50   - Requires considerable assistance and frequent medical care. 56   - Disabled; requires special care and assistance. 58   - Severely disabled; hospital admission is indicated although death not imminent. 12   - Very sick; hospital admission necessary; active supportive treatment necessary. 10   - Moribund; fatal processes progressing rapidly. 0     - Dead  Karnofsky DA, Abelmann WH, Craver LS and Burchenal [facility] [phone]) The use of the nitrogen mustards in the palliative  treatment of carcinoma: with particular reference to bronchogenic carcinoma Cancer 1 [id number]  LABORATORY DATA:  Lab Results  Component Value Date   WBC 6.7 02/13/2020   HGB 14.3 02/13/2020   HCT 42.9 02/13/2020   MCV 93.3 02/13/2020   PLT 231 02/13/2020   Lab Results  Component Value Date   NA 143 02/13/2020   K 3.7 02/13/2020   CL 106 02/13/2020   CO2 31 02/13/2020   Lab Results  Component Value Date   ALT 21 02/13/2020   AST 20 02/13/2020   ALKPHOS 46 02/13/2020   BILITOT 0.8 02/13/2020    PULMONARY FUNCTION TEST:   Recent Review Flowsheet Data   There is no flowsheet data to display.     RADIOGRAPHY: US BREAST LTD UNI LEFT INC AXILLA  Result Date: 01/23/2020 CLINICAL DATA:  49 year old female presenting with a new lump in the left breast for approximately two weeks. EXAM: DIGITAL DIAGNOSTIC LEFT MAMMOGRAM WITH CAD AND TOMO ULTRASOUND LEFT BREAST COMPARISON:  Previous exam(s). ACR Breast Density Category b: There are  scattered areas of fibroglandular density. FINDINGS: Mammogram: A skin BB marks the palpable site of concern reported by the patient in the upper outer left breast. A spot tangential tomosynthesis view was performed in addition to standard views. At the palpable site of concern there is a small mass with distortion that measures approximately 0.9 cm. A spot compression tomosynthesis view was performed for a possible asymmetry in the far posterior slightly outer left breast which did not persist on the spot imaging. Mammographic images were processed with CAD. On physical exam, I palpate a small discrete mass at the site of concern reported by the patient in the upper outer left breast. Ultrasound: Targeted ultrasound is performed in the left breast at 2 o'clock 1 cm from the nipple demonstrating an irregular hypoechoic mass extending to the skin measuring approximately 1.0 x 1.3 x 0.8 cm. The mass demonstrates internal vascularity. Targeted ultrasound of the left axilla demonstrates one lymph node with borderline cortical thickening measuring 0.4 cm. IMPRESSION: 1. At the palpable site of concern in the left breast at 2 o'clock there is a suspicious mass measuring 1.3 cm. The mass extends to the skin. 2.  Borderline abnormal left axillary lymph node. RECOMMENDATION: Ultrasound-guided core needle biopsy of the left breast mass at 2 o'clock and of the borderline abnormal left axillary lymph node. I have discussed the findings and recommendations with the patient. If applicable, a reminder letter will be sent to the patient regarding the next appointment. BI-RADS CATEGORY  4: Suspicious. Electronically Signed   By: Audie Pinto M.D.   On: 01/23/2020 14:44   MM DIAG BREAST TOMO UNI LEFT  Result Date: 01/23/2020 CLINICAL DATA:  49 year old female presenting with a new lump in the left breast for approximately two weeks. EXAM: DIGITAL DIAGNOSTIC LEFT MAMMOGRAM WITH CAD AND TOMO ULTRASOUND LEFT BREAST COMPARISON:   Previous exam(s). ACR Breast Density Category b: There are scattered areas of fibroglandular density. FINDINGS: Mammogram: A skin BB marks the palpable site of concern reported by the patient in the upper outer left breast. A spot tangential tomosynthesis view was performed in addition to standard views. At the palpable site of concern there is a small mass with distortion that measures approximately 0.9 cm. A spot compression tomosynthesis view was performed for a possible asymmetry in the far posterior slightly outer left breast which did not persist on the spot imaging. Mammographic images were processed with CAD. On physical exam,  I palpate a small discrete mass at the site of concern reported by the patient in the upper outer left breast. Ultrasound: Targeted ultrasound is performed in the left breast at 2 o'clock 1 cm from the nipple demonstrating an irregular hypoechoic mass extending to the skin measuring approximately 1.0 x 1.3 x 0.8 cm. The mass demonstrates internal vascularity. Targeted ultrasound of the left axilla demonstrates one lymph node with borderline cortical thickening measuring 0.4 cm. IMPRESSION: 1. At the palpable site of concern in the left breast at 2 o'clock there is a suspicious mass measuring 1.3 cm. The mass extends to the skin. 2.  Borderline abnormal left axillary lymph node. RECOMMENDATION: Ultrasound-guided core needle biopsy of the left breast mass at 2 o'clock and of the borderline abnormal left axillary lymph node. I have discussed the findings and recommendations with the patient. If applicable, a reminder letter will be sent to the patient regarding the next appointment. BI-RADS CATEGORY  4: Suspicious. Electronically Signed   By: Audie Pinto M.D.   On: 01/23/2020 14:44   [place] AXILLARY NODE CORE BIOPSY LEFT  Addendum Date: 02/05/2020   ADDENDUM REPORT: 02/05/2020 15:26 ADDENDUM: Pathology revealed GRADE II INVASIVE DUCTAL CARCINOMA of the LEFT breast mass 2:00. This  was found to be concordant by Dr. Ammie Ferrier. 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 This was found to be concordant by Dr. Ammie Ferrier. Pathology results were discussed with the patient and her husband by telephone. The patient reported doing well after the biopsies with tenderness at the sites. Post biopsy instructions and care were reviewed and questions were answered. The patient was encouraged to call [facility] for any additional concerns. The patient was referred to [facility] on February 13, 2020. Pathology results reported by Stacie Acres RN on 02/05/2020. Electronically Signed   By: Ammie Ferrier M.D.   On: 02/05/2020 15:26   Result Date: 02/05/2020 CLINICAL DATA:  49 year old female presenting for ultrasound-guided biopsy of a left breast mass and left axillary lymph node. EXAM: ULTRASOUND GUIDED LEFT BREAST CORE NEEDLE BIOPSY COMPARISON:  Previous exam(s). PROCEDURE: I met with the patient and we discussed the procedure of ultrasound-guided biopsy, including benefits and alternatives. We discussed the high likelihood of a successful procedure. We discussed the risks of the procedure, including infection, bleeding, tissue injury, clip migration, and inadequate sampling. Informed written consent was given. The usual time-out protocol was performed immediately prior to the procedure. #1 Lesion quadrant: Upper outer quadrant Using sterile technique and 1% Lidocaine as local anesthetic, under direct ultrasound visualization, a 14 gauge spring-loaded device was used to perform biopsy of a mass in the left breast at 2 o'clock using a lateral approach. At the conclusion of the procedure a ribbon shaped tissue marker clip was deployed into the biopsy cavity.  -------------------------------------------------------------------------------------------------------------------------------------------- #2 Lesion quadrant: Left axilla Using sterile technique and 1% Lidocaine as local anesthetic, under direct ultrasound visualization, a 14 gauge spring-loaded device was used to perform biopsy of a left axillary lymph node using a lateral approach. At the conclusion of the procedure a HydroMARK spiral shaped tissue marker clip was deployed into the biopsy cavity. Follow up 2 view mammogram was performed and dictated separately. IMPRESSION: 1. Ultrasound guided biopsy of a left breast mass at 2 o'clock. No apparent complications. 2. Ultrasound guided biopsy of a left axillary lymph node. No apparent complications. Electronically Signed: By: Ammie Ferrier  M.D. On: 02/04/2020 08:45   MM CLIP PLACEMENT LEFT  Result Date: 02/04/2020 CLINICAL DATA:  Post biopsy mammogram of the left breast for clip placement. EXAM: DIAGNOSTIC LEFT MAMMOGRAM POST ULTRASOUND BIOPSY COMPARISON:  Previous exam(s). FINDINGS: Mammographic images were obtained following ultrasound guided biopsy of a mass in the left breast at 2 o'clock and a left axillary lymph node. The biopsy marking clips are in expected position at the sites of biopsy. IMPRESSION: 1. Appropriate positioning of the ribbon shaped biopsy marking clip at the site of biopsy in the left breast at 2 o'clock. 2. Appropriate positioning of the [facility] biopsy marking clip in the left axilla. Final Assessment: Post Procedure Mammograms for Marker Placement Electronically Signed   By: Ammie Ferrier M.D.   On: 02/04/2020 08:55   [place] LT BREAST BX W LOC DEV 1ST LESION IMG BX SPEC US GUIDE  Addendum Date: 02/05/2020   ADDENDUM REPORT: 02/05/2020 15:26 ADDENDUM: Pathology revealed GRADE II INVASIVE DUCTAL CARCINOMA of the LEFT breast mass 2:00. This was found to be concordant by Dr. Ammie Ferrier. Pathology revealed METASTATIC  CARCINOMA of the LEFT axillary lymph node. This was found to be concordant by Dr. Ammie Ferrier. Pathology results were discussed with the patient and her husband by telephone. The patient reported doing well after the biopsies with tenderness at the sites. Post biopsy instructions and care were reviewed and questions were answered. The patient was encouraged to call [facility] for any additional concerns. The patient was referred to [facility] on February 13, 2020. Pathology results reported by Stacie Acres RN on 02/05/2020. Electronically Signed   By: Ammie Ferrier M.D.   On: 02/05/2020 15:26   Result Date: 02/05/2020 CLINICAL DATA:  49 year old female presenting for ultrasound-guided biopsy of a left breast mass and left axillary lymph node. EXAM: ULTRASOUND GUIDED LEFT BREAST CORE NEEDLE BIOPSY COMPARISON:  Previous exam(s). PROCEDURE: I met with the patient and we discussed the procedure of ultrasound-guided biopsy, including benefits and alternatives. We discussed the high likelihood of a successful procedure. We discussed the risks of the procedure, including infection, bleeding, tissue injury, clip migration, and inadequate sampling. Informed written consent was given. The usual time-out protocol was performed immediately prior to the procedure. #1 Lesion quadrant: Upper outer quadrant Using sterile technique and 1% Lidocaine as local anesthetic, under direct ultrasound visualization, a 14 gauge spring-loaded device was used to perform biopsy of a mass in the left breast at 2 o'clock using a lateral approach. At the conclusion of the procedure a ribbon shaped tissue marker clip was deployed into the biopsy cavity. -------------------------------------------------------------------------------------------------------------------------------------------- #2 Lesion quadrant: Left axilla Using sterile  technique and 1% Lidocaine as local anesthetic, under direct ultrasound visualization, a 14 gauge spring-loaded device was used to perform biopsy of a left axillary lymph node using a lateral approach. At the conclusion of the procedure a HydroMARK spiral shaped tissue marker clip was deployed into the biopsy cavity. Follow up 2 view mammogram was performed and dictated separately. IMPRESSION: 1. Ultrasound guided biopsy of a left breast mass at 2 o'clock. No apparent complications. 2. Ultrasound guided biopsy of a left axillary lymph node. No apparent complications. Electronically Signed: By: Ammie Ferrier M.D. On: 02/04/2020 08:45      IMPRESSION: Stage T1c, N1, Mx, Left Breast UOQ, Invasive Ductal Carcinoma, ER+ / PR+ / Her2-, Grade 2   The patient will be a good candidate for breast  conservation with radiotherapy to the left breast. She will get comprehensive radiation to cover the left axillary region given her positive lymph node. We discussed the general course of radiation, potential side effects, and toxicities with radiation and the patient is interested in this approach.    PLAN:  1. Genetics 2. Mammaprint on biopsy specimen if possible 3. Left lumpectomy with TAD 4. Adjuvant radiation therapy 5. Aromatase inhibitor   ------------------------------------------------  Blair Promise, PhD, MD  This document serves as a record of services personally performed by Gery Pray, MD. It was created on his behalf by Clerance Lav, a trained medical scribe. The creation of this record is based on the scribe's personal observations and the provider's statements to them. This document has been checked and approved by the attending provider.

## 2020-02-13 NOTE — H&P (Signed)
Michaela Roman Appointment: 02/13/2020 9:00 AM Location: [facility] Patient #: [id number] DOB: [birth year] Undefined / Language: Michaela Roman / Race: White Female  History of Present Illness [name] A. [name] MD; 02/13/2020 12:01 PM) Patient words: PT presents to the [facility] at the request of Dr Theda Sers of the [facility] for SDM of left breast that shows a 1.3 cm mass UOQ left breast core bx IDC ER/PR pos her 2 neu negative ki 30 %. Pt complains of 1 month hx of mass left breast left axillary LN positive for metastatic disease No pain or discharge right breast normal otherwise.  The patient is a 49 year old female.   Past Surgical History [name], RN; 02/13/2020 8:18 AM) Breast Biopsy Left. Oral Surgery  Diagnostic Studies History [name], RN; 02/13/2020 8:18 AM) Colonoscopy never Mammogram within last year Pap Smear 1-5 years ago  Medication History [name], RN; 02/13/2020 8:18 AM) Medications Reconciled  Social History [name], RN; 02/13/2020 8:18 AM) Alcohol use Occasional alcohol use. Caffeine use Coffee, Tea. Illicit drug use Prefer to discuss with provider. Tobacco use Never smoker.  Family History [name], RN; 02/13/2020 8:18 AM) Alcohol Abuse Father. Depression Mother. Diabetes Mellitus Family Members In General. Heart Disease Family Members In General. Thyroid problems Sister.  Pregnancy / Birth History [name], RN; 02/13/2020 8:18 AM) Age at menarche 16 years. Age of menopause <46 Contraceptive History Oral contraceptives. Gravida 0 Para 0 Regular periods  Other Problems [name], RN; 02/13/2020 8:18 AM) Anxiety Disorder Back Pain Depression Thyroid Disease     Review of Systems ([name] MD; 02/13/2020 12:00 PM) General Not Present- Appetite Loss, Chills, Fatigue, Fever, Night Sweats, Weight Gain and Weight Loss. Skin Not Present- Change in Wart/Mole,  Dryness, Hives, Jaundice, New Lesions, Non-Healing Wounds, Rash and Ulcer. HEENT Present- Wears glasses/contact lenses. Not Present- Earache, Hearing Loss, Hoarseness, Nose Bleed, Oral Ulcers, Ringing in the Ears, Seasonal Allergies, Sinus Pain, Sore Throat, Visual Disturbances and Yellow Eyes. Respiratory Not Present- Bloody sputum, Chronic Cough, Difficulty Breathing, Snoring and Wheezing. Breast Present- Breast Mass. Not Present- Breast Pain, Nipple Discharge and Skin Changes. Cardiovascular Not Present- Chest Pain, Difficulty Breathing Lying Down, Leg Cramps, Palpitations, Rapid Heart Rate, Shortness of Breath and Swelling of Extremities. Gastrointestinal Not Present- Abdominal Pain, Bloating, Bloody Stool, Change in Bowel Habits, Chronic diarrhea, Constipation, Difficulty Swallowing, Excessive gas, Gets full quickly at meals, Hemorrhoids, Indigestion, Nausea, Rectal Pain and Vomiting. Female Genitourinary Not Present- Frequency, Nocturia, Painful Urination, Pelvic Pain and Urgency. Musculoskeletal Not Present- Back Pain, Joint Pain, Joint Stiffness, Muscle Pain, Muscle Weakness and Swelling of Extremities. Neurological Not Present- Decreased Memory, Fainting, Headaches, Numbness, Seizures, Tingling, Tremor, Trouble walking and Weakness. Psychiatric Present- Anxiety, Depression, Fearful and Frequent crying. Not Present- Bipolar and Change in Sleep Pattern. Endocrine Not Present- Cold Intolerance, Excessive Hunger, Hair Changes, Heat Intolerance, Hot flashes and New Diabetes. Hematology Not Present- Blood Thinners, Easy Bruising, Excessive bleeding, Gland problems, HIV and Persistent Infections. All other systems negative   Physical Exam ([name] A. Reyonna Haack MD; 02/13/2020 12:01 PM)  General Mental Status-Alert. General Appearance-Consistent with stated age. Hydration-Well hydrated. Voice-Normal.  Head and Neck Head-normocephalic, atraumatic with no lesions or palpable  masses. Trachea-midline. Thyroid Gland Characteristics - normal size and consistency.  Chest and Lung Exam Chest and lung exam reveals -quiet, even and easy respiratory effort with no use of accessory muscles and on auscultation, normal breath sounds, no adventitious sounds and normal vocal resonance. Inspection Chest Wall -  Normal. Back - normal.  Breast Note: right breast normal left breast bruising at 2 oclock no palpable axillary adenopathy bilaterally  Cardiovascular Cardiovascular examination reveals -normal heart sounds, regular rate and rhythm with no murmurs and normal pedal pulses bilaterally.  Neurologic Neurologic evaluation reveals -alert and oriented x 3 with no impairment of recent or remote memory. Mental Status-Normal.  Lymphatic Head & Neck  General Head & Neck Lymphatics: Bilateral - Description - Normal. Axillary  General Axillary Region: Bilateral - Description - Normal. Tenderness - Non Tender.    Assessment & Plan ([name] A. [name] MD; 02/13/2020 12:08 PM)  BREAST CANCER, STAGE 2, LEFT (C50.912) Impression: discussed left breast lumpectomy targeted lymph node biopsy and left SLN mapping vs mastectomy and reconstruction genetics would be a good NSM candidate but defer to plastics  Wants lumpectomy if genetics negative  plan for breast conservation pending genetics  may need port as well discussed that with her   Pt requires port placement for chemotherapy. Risk include bleeding, infection, pneumothorax, hemothorax, mediastinal injury, nerve injury , blood vessel injury, strke, blood clots, death, migration. embolization and need for additional procedures. Pt agrees to proceed.  Risk of lumpectomy include bleeding, infection, seroma, more surgery, use of seed/wire, wound care, cosmetic deformity and the need for other treatments, death , blood clots, death. Pt agrees to proceed. Risk of sentinel lymph node mapping include bleeding,  infection, lymphedema, shoulder pain. stiffness, dye allergy. cosmetic deformity , blood clots, death, need for more surgery. Pt agrees to proceed.  total time 45 minutes  Current Plans You are being scheduled for surgery- Our schedulers will call you.  You should hear from our office's scheduling department within 5 working days about the location, date, and time of surgery. We try to make accommodations for patient's preferences in scheduling surgery, but sometimes the OR schedule or the surgeon's schedule prevents [place] from making those accommodations.  If you have not heard from our office [phone]) in 5 working days, call the office and ask for your surgeon's nurse.  If you have other questions about your diagnosis, plan, or surgery, call the office and ask for your surgeon's nurse.  Pt Education - [facility] Breast Cancer Information Given - [facility] "Breast Journey" Package We discussed the staging and pathophysiology of breast cancer. We discussed all of the different options for treatment for breast cancer including surgery, chemotherapy, radiation therapy, Herceptin, and antiestrogen therapy. We discussed a sentinel lymph node biopsy as she does not appear to having lymph node involvement right now. We discussed the performance of that with injection of radioactive tracer and blue dye. We discussed that she would have an incision underneath her axillary hairline. We discussed that there is a bout a 10-20% chance of having a positive node with a sentinel lymph node biopsy and we will await the permanent pathology to make any other first further decisions in terms of her treatment. One of these options might be to return to the operating room to perform an axillary lymph node dissection. We discussed about a 1-2% risk lifetime of chronic shoulder pain as well as lymphedema associated with a sentinel lymph node biopsy. We discussed the options for treatment of the breast cancer which included  lumpectomy versus a mastectomy. We discussed the performance of the lumpectomy with a wire placement. We discussed a 10-20% chance of a positive margin requiring reexcision in the operating room. We also discussed that she may need radiation therapy or antiestrogen therapy or both if she  undergoes lumpectomy. We discussed the mastectomy and the postoperative care for that as well. We discussed that there is no difference in her survival whether she undergoes lumpectomy with radiation therapy or antiestrogen therapy versus a mastectomy. There is a slight difference in the local recurrence rate being 3-5% with lumpectomy and about 1% with a mastectomy. We discussed the risks of operation including bleeding, infection, possible reoperation. She understands her further therapy will be based on what her stages at the time of her operation.  Pt Education - [facility] breast cancer surgery: discussed with patient and provided information.

## 2020-02-13 NOTE — Therapy (Signed)
[place], [place], [address] Phone: [phone]   Fax:  [phone]  Physical Therapy Evaluation  Patient Details  Name: Michaela Roman MRN: [id number] Date of Birth: 1970/10/01 Referring Provider (PT): Dr. Erroll Luna   Encounter Date: 02/13/2020   PT End of Session - 02/13/20 1231    Visit Number 1    Number of Visits 2    Date for PT Re-Evaluation 04/09/20    PT Start Time 1055    PT Stop Time 1130    PT Time Calculation (min) 35 min    Activity Tolerance Patient tolerated treatment well           Past Medical History:  Diagnosis Date  . Anxiety   . Breast cancer ([place])   . Depression   . Hormone disorder   . Hypothyroidism   . Premature ovarian failure    Dx'd age 73  . Thyroid disease    hypothyroid    Past Surgical History:  Procedure Laterality Date  . DILATATION & CURETTAGE/HYSTEROSCOPY WITH MYOSURE N/A 10/07/2015   Procedure: DILATATION & CURETTAGE/HYSTEROSCOPY WITH MYOSURE;  Surgeon: Nunzio Cobbs, MD;  Location: [facility];  Service: Gynecology;  Laterality: N/A;  endometrial polyp  . WISDOM TOOTH EXTRACTION      There were no vitals filed for this visit.    Subjective Assessment - 02/13/20 1051    Subjective Patient reports she is here today to be seen by hre medical team for her newly diagnosed left breast cancer.    Pertinent History Patient was diagnosed on 01/23/2020 with left 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 It measures 1.3 cm and is located in the upper outer quadrant. It is ER/PR positive and HER2 negative with a Ki67 of 30%. She has a known positive axillary lymph node.    Patient Stated Goals Learn post op ROM HEP and lymphedmea risk reduction    Currently in Pain? No/denies              [facility] PT Assessment - 02/13/20 0001      Assessment   Medical Diagnosis Left breast cancer    Referring Provider (PT) Dr. [name] [place]     Onset Date/Surgical Date 01/23/20    Hand Dominance Right    Prior Therapy none      Precautions   Precautions Other (comment)    Precaution Comments active cancer      Restrictions   Weight Bearing Restrictions No      Balance Screen   Has the patient fallen in the past 6 months No    Has the patient had a decrease in activity level because of a fear of falling?  No    Is the patient reluctant to leave their home because of a fear of falling?  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 [place]    Leisure She exercises 5-6x/week for 30-45 min doing HIIT, walking, yoga      Cognition   Overall Cognitive Status Within Functional Limits for tasks assessed      Posture/Postural Control   Posture/Postural Control No significant limitations    Postural Limitations --  ROM / Strength   AROM / PROM / Strength AROM;Strength      AROM   Overall AROM Comments Cervical AROM is WNL    AROM Assessment Site Shoulder    Right/Left Shoulder Right;Left    Right Shoulder Extension 42 Degrees    Right Shoulder Flexion 153 Degrees    Right Shoulder ABduction 167 Degrees    Right Shoulder Internal Rotation 56 Degrees    Right Shoulder External Rotation 90 Degrees    Left Shoulder Extension 46 Degrees    Left Shoulder Flexion 150 Degrees    Left Shoulder ABduction 168 Degrees    Left Shoulder Internal Rotation 56 Degrees    Left Shoulder External Rotation 90 Degrees      Strength   Overall Strength Within functional limits for tasks performed             LYMPHEDEMA/ONCOLOGY QUESTIONNAIRE - 02/13/20 0001      Type   Cancer Type Left breast cancer      Lymphedema Assessments   Lymphedema Assessments Upper  extremities      Right Upper Extremity Lymphedema   10 cm Proximal to Olecranon Process 29.6 cm    Olecranon Process 23.4 cm    10 cm Proximal to Ulnar Styloid Process 20.9 cm    Just Proximal to Ulnar Styloid Process 13.8 cm    Across Hand at [employer] 17.8 cm    At [place] of 2nd Digit 5.9 cm      Left Upper Extremity Lymphedema   10 cm Proximal to Olecranon Process 28.8 cm    Olecranon Process 23.2 cm    10 cm Proximal to Ulnar Styloid Process 20.5 cm    Just Proximal to Ulnar Styloid Process 13.4 cm    Across Hand at [employer] 17.4 cm    At [place] of 2nd Digit 5.8 cm           L-DEX FLOWSHEETS - 02/13/20 1200      L-DEX LYMPHEDEMA SCREENING   Measurement Type Unilateral    L-DEX MEASUREMENT EXTREMITY Upper Extremity    POSITION  Standing    DOMINANT SIDE Right    At Risk Side Left    BASELINE SCORE (UNILATERAL) -0.7                Quick Dash - 02/13/20 0001    Open a tight or new jar No difficulty    Do heavy household chores (wash walls, wash floors) No difficulty    Carry a shopping bag or briefcase No difficulty    Wash your back No difficulty    Use a knife to cut food No difficulty    Recreational activities in which you take some force or impact through your arm, shoulder, or hand (golf, hammering, tennis) No difficulty    During the past week, to what extent has your arm, shoulder or hand problem interfered with your normal social activities with family, friends, neighbors, or groups? Not at all    During the past week, to what extent has your arm, shoulder or hand problem limited your work or other regular daily activities Not at all    Arm, shoulder, or hand pain. None    Tingling (pins and needles) in your arm, shoulder, or hand None    Difficulty Sleeping No difficulty    DASH Score 0 %            Objective measurements completed on examination: See above findings.  Patient was instructed today in a home exercise program  today for post op shoulder range of motion. These included active assist shoulder flexion in sitting, scapular retraction, wall walking with shoulder abduction, and hands behind head external rotation.  She was encouraged to do these twice a day, holding 3 seconds and repeating 5 times when permitted by her physician.           PT Education - 02/13/20 1228    Education Details Lymphedema risk reduction and post op shoulder ROM HEP    Person(s) Educated Patient;Spouse    Methods Explanation;Demonstration;Handout    Comprehension Returned demonstration;Verbalized understanding               PT Long Term Goals - 02/13/20 1236      PT LONG TERM GOAL #1   Title Patient will demonstrate she has regained full shoulder ROM and function post operatively compared to baselines.    Time 8    Period Weeks    Status New    Target Date 04/09/20           Breast Clinic Goals - 02/13/20 1235      Patient will be able to verbalize understanding of pertinent lymphedema risk reduction practices relevant to her diagnosis specifically related to skin care.   Time 1    Period Days    Status Achieved      Patient will be able to return demonstrate and/or verbalize understanding of the post-op home exercise program related to regaining shoulder range of motion.   Time 1    Status Achieved      Patient will be able to verbalize understanding of the importance of attending the postoperative After Breast Cancer Class for further lymphedema risk reduction education and therapeutic exercise.   Time 1    Period Days    Status Achieved                 Plan - 02/13/20 1232    Clinical Impression Statement Patient was diagnosed on 01/23/2020 with left 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 It measures 1.3 cm and is located in the upper outer quadrant. It is ER/PR positive and HER2 negative with a Ki67 of 30%. She has a known positive axillary lymph node. Her multidisciplinary  medical team met prior to her assessments to determine a recommended treatment plan. She is planning to have a left lumpectomy and targeted axillary lymph node dissection but will try to obtain Mammaprint results from the core biopsy. If that comes back high risk, she may undergo neoadjuvant chemotherapy prior to surgery. She will undergo radiation and anti-estrogen therapy as well.    Stability/Clinical Decision Making Stable/Uncomplicated    Clinical Decision Making Low    Rehab Potential Excellent    PT Frequency --   Eval and 1 f/u visit   PT Treatment/Interventions ADLs/Self Care Home Management;Therapeutic exercise;Patient/family education    PT Next Visit Plan Will reassess 3-4 weeks post op to determine needs    PT Home Exercise Plan Post op shoulder ROM HEP    Consulted and Agree with Plan of Care Patient;Family member/caregiver    Family Member Consulted Husband           Patient will benefit from skilled therapeutic intervention in order to improve the following deficits and impairments:  Postural dysfunction,Decreased range of motion,Decreased knowledge of precautions,Impaired UE functional use,Pain  Visit Diagnosis: Malignant neoplasm of upper-outer quadrant of left breast in female, estrogen receptor positive ([place]) -  Plan: PT plan of care cert/re-cert   Patient will follow up at outpatient cancer rehab 3-4 weeks following surgery.  If the patient requires physical therapy at that time, a specific plan will be dictated and sent to the referring physician for approval. The patient was educated today on appropriate basic range of motion exercises to begin post operatively and the importance of attending the After Breast Cancer class following surgery.  Patient was educated today on lymphedema risk reduction practices as it pertains to recommendations that will benefit the patient immediately following surgery.  She verbalized good understanding.      Problem List Patient Active  Problem List   Diagnosis Date Noted  . Malignant neoplasm of upper-outer quadrant of left breast in female, estrogen receptor positive ([place]) 02/06/2020   Annia Friendly, PT 02/13/20 12:40 PM  [place], [place], [address] Phone: [phone]   Fax:  [phone]  Name: JAMES LAFALCE MRN: [id number] Date of Birth: May 16, 1970

## 2020-02-14 ENCOUNTER — Telehealth: Payer: Self-pay | Admitting: Oncology

## 2020-02-14 NOTE — Telephone Encounter (Signed)
Scheduled appts per 12/15 los. Pt confirmed appt date and time.

## 2020-02-18 ENCOUNTER — Encounter: Payer: Self-pay | Admitting: *Deleted

## 2020-02-20 ENCOUNTER — Ambulatory Visit (INDEPENDENT_AMBULATORY_CARE_PROVIDER_SITE_OTHER): Payer: 59 | Admitting: Addiction (Substance Use Disorder)

## 2020-02-20 ENCOUNTER — Other Ambulatory Visit: Payer: Self-pay

## 2020-02-20 DIAGNOSIS — F4323 Adjustment disorder with mixed anxiety and depressed mood: Secondary | ICD-10-CM | POA: Diagnosis not present

## 2020-02-20 NOTE — Progress Notes (Signed)
      [facility] Counselor/Therapist Progress Note  Patient ID: Michaela Roman, MRN: [id number],    Date: 02/20/2020  Time Spent:  62mins  Treatment Type: Individual Therapy  Reported Symptoms: anxious and upset.   Mental Status Exam:  Appearance:   Casual     Behavior:  Appropriate and Sharing  Motor:  Normal  Speech/Language:   Clear and Coherent and Normal Rate  Affect:  Appropriate, Labile and Tearful  Mood:  angry, irritable and sad  Thought process:  circumstantial  Thought content:    Obsessions and Rumination  Sensory/Perceptual disturbances:    WNL  Orientation:  x4  Attention:  Good  Concentration:  Good  Memory:  WNL  Fund of knowledge:   Good  Insight:    Good  Judgment:   Good  Impulse Control:  Fair   Risk Assessment: Danger to Self:  No. Denied.  Self-injurious Behavior: No Danger to Others: No Duty to Warn:no Physical Aggression / Violence:No  Access to Firearms a concern: No  Gang Involvement:No   Subjective: Client reported feeling increased stress and anxiety in her body, causing her to feel more frustrated with what she is going through physically. Client processed having heard the upcoming plan for her treatment and not being relieved like her husband was, that it wasn't as severe as it could have been. Client angry that his response was that way, as she is still bargaining and angry in the stages of grief. Client experienceing increased somatic symptoms and therapist used BSP with client to help her process and help bring relief. Client's SUDs of tightness in her ribs/chest decreased from 9/10 and panicky feelings to a 2/10 (some relief in her body). Client was thoughtful and noticed a lot of ruminating negative catastrophic thinking that therapist used MI and grief therapy to help her work through. Therapist assessed client's safety and client denied SI/HI/AVH.   Interventions: Motivational Interviewing, Grief Therapy and BSP  Diagnosis:    ICD-10-CM   1. Adjustment disorder with mixed anxiety and depressed mood  F43.23      Plan of Care: Client to return for weekly therapy with Sammuel Cooper, therapist, to review again in 6 months. Client is to continue seeing medication provider for support of mood management.   Client to engage in CBT: challenging negative internal ruminationsand self-talk AEB journaling daily or expressing thoughts to support persons in their life and then challenging it with truth.  Client to engage in tapping to tap in healthy cognition that challenges negative rumination or deep negative core belief.  Client to practice DBT distress tolerance skills to decrease crying spells and thoughts of not being able to endure their suffering AEB using mindfulness, deep breathing, and TIP to increase tolerance for discomfort, discharge emotional distress, and increase their understanding that they can do hard things.  Client to utilize BSP (brainspotting) with therapist to help client identify and process triggers for their depressive episode and anxiety with goal of reducing said SUDs caused by depression/anxiety by 33% in the next 6 months. Client to prioritize sleep 8+ hours each week night AEB going to bed by 10pm each night.  Barnie Del, LCSW, LCAS, CCTP, CCS-I, BSP

## 2020-02-21 ENCOUNTER — Telehealth: Payer: Self-pay | Admitting: Genetic Counselor

## 2020-02-21 ENCOUNTER — Telehealth: Payer: Self-pay | Admitting: *Deleted

## 2020-02-21 ENCOUNTER — Encounter: Payer: Self-pay | Admitting: *Deleted

## 2020-02-21 ENCOUNTER — Encounter: Payer: Self-pay | Admitting: Genetic Counselor

## 2020-02-21 DIAGNOSIS — Z1379 Encounter for other screening for genetic and chromosomal anomalies: Secondary | ICD-10-CM | POA: Insufficient documentation

## 2020-02-21 NOTE — Telephone Encounter (Signed)
Received mammaprint results low risk. Patient is aware.  Team notified.

## 2020-02-21 NOTE — Telephone Encounter (Signed)
Revealed negative genetic testing for [facility] STAT Breast Cancer Panel.  Discussed that we do not know why she has breast cancer or why there is cancer in the family. It could be sporadic/familial, due to a different gene that we are not testing, or maybe our current technology may not be able to pick something up.  It will be important for her to keep in contact with genetics to keep up with whether additional testing may be needed.  Results of [facility] CancerNext-Expanded are pending.

## 2020-02-27 ENCOUNTER — Encounter: Payer: Self-pay | Admitting: *Deleted

## 2020-03-03 ENCOUNTER — Encounter: Payer: Self-pay | Admitting: Genetic Counselor

## 2020-03-03 ENCOUNTER — Telehealth: Payer: Self-pay | Admitting: Genetic Counselor

## 2020-03-03 ENCOUNTER — Ambulatory Visit: Payer: Self-pay | Admitting: Genetic Counselor

## 2020-03-03 DIAGNOSIS — Z803 Family history of malignant neoplasm of breast: Secondary | ICD-10-CM

## 2020-03-03 DIAGNOSIS — C50412 Malignant neoplasm of upper-outer quadrant of left female breast: Secondary | ICD-10-CM

## 2020-03-03 DIAGNOSIS — Z1379 Encounter for other screening for genetic and chromosomal anomalies: Secondary | ICD-10-CM

## 2020-03-03 DIAGNOSIS — Z8042 Family history of malignant neoplasm of prostate: Secondary | ICD-10-CM

## 2020-03-03 DIAGNOSIS — Z1503 Genetic susceptibility to malignant neoplasm of prostate: Secondary | ICD-10-CM

## 2020-03-03 DIAGNOSIS — Z800 Family history of malignant neoplasm of digestive organs: Secondary | ICD-10-CM

## 2020-03-03 HISTORY — DX: Genetic susceptibility to malignant neoplasm of prostate: Z15.03

## 2020-03-03 NOTE — Progress Notes (Signed)
REFERRING PROVIDER: Magrinat, Virgie Dad, MD  PRIMARY PROVIDER:  Glenis Smoker, MD  PRIMARY REASON FOR VISIT:  1. Malignant neoplasm of upper-outer quadrant of left breast in female, estrogen receptor positive ([place])   2. 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 Family history of prostate cancer   4. Family history of breast cancer   5. Family history of colon cancer   6. Genetic testing     GENETIC TEST RESULTS  Patient Name: Michaela Roman Patient Age: 50 y.o. Encounter Date: 03/03/2020  HPI: Michaela Roman was previously seen in the [place] clinic due to a personal and family history of cancer and concerns regarding a hereditary predisposition to cancer. Please refer to the prior Genetics clinic note for more information regarding Michaela Roman's medical and family histories and our assessment at the time. Michaela Roman recent genetic test results were disclosed to her, as were recommendations warranted by these results. These results and recommendations are discussed in more detail below.   FAMILY HISTORY:  We obtained a detailed, 4-generation family history.  Significant diagnoses are listed below: Family History  Problem Relation Age of Onset  . Breast cancer Other        MGF's niece; dx 69s  . Colon cancer Other        MGF's brother; dx 51s  . Prostate cancer Cousin 85       maternal cousin      Ms. Strathman has one brother, age 69, and one sister, age 41, both without a cancer history.  Ms. Riding mother is 33 years old and has not had cancer.  Ms. Brickhouse maternal cousin was diagnosed with prostate cancer at age 36.  Ms. Albritton has distant maternal relatives with breast cancer (dx 7s) and colon cancer (dx 27).  No other maternal family history of cancer was reported.  Ms. Vitelli father is 103 years old and has not had cancer.  No paternal family history of cancer was reported.   Ms. Mattera is unaware  of previous family history of genetic testing for hereditary cancer risks. Patient's maternal ancestors are of [place] descent, and paternal ancestors are of [place] descent. There is no reported Ashkenazi Jewish ancestry. There is no known consanguinity.  GENETIC TESTING:  Genetic testing reported out on February 26, 2020.  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  The CancerNext-Expanded gene panel offered by [facility] and includes sequencing, rearrangement, and RNA analysis for the following 77 genes: AIP, ALK, APC, ATM, AXIN2, BAP1, BARD1, BLM, BMPR1A, BRCA1, BRCA2, BRIP1, CDC73, CDH1, CDK4, CDKN1B, CDKN2A, CHEK2, CTNNA1, DICER1, FANCC, FH, FLCN, GALNT12, KIF1B, LZTR1, MAX, MEN1, MET, MLH1, MSH2, MSH3, MSH6, MUTYH, NBN, NF1, NF2, NTHL1, PALB2, PHOX2B, PMS2, POT1, PRKAR1A, PTCH1, PTEN, RAD51C, RAD51D, RB1, RECQL, RET, SDHA, SDHAF2, SDHB, SDHC, SDHD, SMAD4, SMARCA4, SMARCB1, SMARCE1, STK11, SUFU, TMEM127, TP53, TSC1, TSC2, VHL and XRCC2 (sequencing and deletion/duplication); EGFR, EGLN1, HOXB13, KIT, MITF, PDGFRA, POLD1, and POLE (sequencing only); EPCAM and GREM1 (deletion/duplication only).   The test report will be scanned into EPIC and located under the Molecular Pathology section of the Results Review tab.  A portion of the result report is included below for reference.     Genetic testing also identified a variant of uncertain significance (VUS) in the CDK4 gene called p.P302L.  At this time, it is unknown if this variant is associated with increased cancer risk or  if this is a normal finding, but most variants such as this get reclassified to being inconsequential. It should not be used to make medical management decisions. With time, we suspect the lab will determine the significance of this variant, if any. If we do learn more about it, we will try to contact Ms.  Roman to discuss it further. However, it is important to stay in touch with [place] periodically and keep the address and phone number up to date.  Michaela Roman genetic test results do not explain why she has a personal history of cancer. We discussed with Michaela Roman that because current genetic testing is not perfect, it is possible there may be a gene mutation in one of these genes that current testing cannot detect, but that chance is small. We also discussed that there could be another gene that has not yet been discovered, or that we have not yet tested, that is responsible for the cancer diagnoses in the family. Therefore, it is important to remain in touch with cancer genetics in the future so that we can continue to offer Michaela Roman the most up to date genetic testing.  HOXB13 CANCER RISKS & RECOMMENDATIONS:  Numerous studies have shown that the c.251G>A (p.Gly84Glu) variant in HOXB13, also known as G84E, is associated with an increased risk of prostate cancer (PMID: 89373428, 76811572, 62035597, 41638453, 64680321, 22482500, 37048889, 16945038, 88280034, 91791505, 69794801). This variant is associated with earlier-onset diagnosis (<55 years) and individuals with this variant are more likely to have a positive family history of prostate cancer (PMID: 65537482, 70786754, 49201007, 12197588, 32549826, 41583094).  Large case control studies and meta-analysis across studies demonstrated that men with this variant have approximately a 33-60% lifetime risk of prostate cancer compared to the general population risk of 12% (PMID: 07680881, 10315945, 85929244, 62863817, 71165790). An individual with this variant will not necessarily develop cancer in his lifetime, but the risk is increased over the general population risk. There may also be an increased risk for other cancer types, however, the current evidence is limited and conflicting (PMID: 38333832, 91916606, 00459977, 41423953,  20233435).  Management for Males:  The [employer]  (NCCN) Prostate Cancer Early Detection Guidelines (Version 2.2021) recommend the following prostate cancer screening for individuals with the HOXB13 G84E variant:   Annual PSA screening at age 63 years or 10 years prior to the youngest prostate cancer diagnosed in the family.  Strongly consider baseline digital rectal examination.  An individual's cancer risk and medical management are not determined by genetic test results alone. Overall cancer risk assessment incorporates additional factors, including personal medical history, family history, and any available genetic information that may result in a personalized plan for cancer prevention and surveillance.  Even though data regarding HOXB13 is limited, knowing if the G84E variant is present is advantageous. At-risk relatives can be identified, enabling pursuit of a diagnostic evaluation. Further, the available information regarding hereditary cancer susceptibility genes is constantly evolving and more clinically relevant data regarding HOXB13 are likely to become available in the near future. Awareness of this cancer predisposition encourages patients and their providers to inform at-risk family members, to diligently follow standard screening protocols, and to be vigilant in maintaining close and regular contact with their local genetics clinic in anticipation of new information.  FAMILY MEMBERS:  Since we now know the mutation in Michaela Roman, we can test at-risk relatives to determine whether or not they have inherited the mutation and are at increased risk for cancer.  It is important that all of Michaela Roman relatives (both males and males) know of the presence of this gene mutation. Site-specific genetic testing can sort out who in the family is at risk and who is not. We will be happy to meet with any of the family members or refer them to a genetic  counselor in their local area. To locate genetic counselors in other cities, individuals can visit [email or web address] and search for a counselor by zip code.    Michaela Roman siblings have a 50% chance to have inherited this mutation. We recommend they have genetic testing for this same mutation, as identifying the presence of this mutation would allow them to also take advantage of risk-reducing measures.   PLAN:  At this time, we do not feel that Michaela Roman is at a higher risk for cancer due to this genetic change.  However, her female relatives are at risk for developing prostate cancer, especially if they have inherited this mutation.  If female relatives are identified as having this mutation, they should follow the management outlined above.  We strongly encouraged Michaela Roman to remain in contact with [place] in cancer genetics on an annual basis so we can update Michaela Roman personal and family histories, and inform her of advances in cancer genetics that may be of benefit for the entire family. Michaela Roman knows she is also welcome to call with any questions or concerns, at any time.   Makya Phillis M. Joette Catching, [place], [facility] Genetic Counselor [email or web address] (P) [phone]

## 2020-03-03 NOTE — Telephone Encounter (Signed)
Revealed likely pathogenic variant in HOXB13 and variant of uncertain significance in CDK4.  Discussed increased lifetime risk for prostate cancer in males in the family with this mutation.  Discussed implications to family members, including risks and management strategies.  Discussed that we do not know why she has breast cancer. It could be sporadic, due to a different gene that we are not testing, or maybe our current technology may not be able to pick something up.  It will be important for her to keep in contact with genetics to keep up with whether additional testing may be needed or if additional information is discovered revealing cancer risks associated with HOXB13.

## 2020-03-04 ENCOUNTER — Other Ambulatory Visit: Payer: Self-pay | Admitting: Surgery

## 2020-03-04 DIAGNOSIS — C50912 Malignant neoplasm of unspecified site of left female breast: Secondary | ICD-10-CM

## 2020-03-05 ENCOUNTER — Inpatient Hospital Stay: Admission: RE | Admit: 2020-03-05 | Payer: 59 | Source: Ambulatory Visit

## 2020-03-06 ENCOUNTER — Other Ambulatory Visit: Payer: Self-pay

## 2020-03-06 ENCOUNTER — Ambulatory Visit (INDEPENDENT_AMBULATORY_CARE_PROVIDER_SITE_OTHER): Payer: 59 | Admitting: Addiction (Substance Use Disorder)

## 2020-03-06 DIAGNOSIS — F411 Generalized anxiety disorder: Secondary | ICD-10-CM

## 2020-03-06 NOTE — Progress Notes (Signed)
      [facility] Counselor/Therapist Progress Note  Patient ID: Michaela Roman, MRN: [id number],    Date: 03/06/2020  Time Spent:  59 mins  Treatment Type: Individual Therapy  Reported Symptoms: hopeful/peaceful  Mental Status Exam:  Appearance:   Casual     Behavior:  Appropriate and Sharing  Motor:  Normal  Speech/Language:   Clear and Coherent and Normal Rate  Affect:  Appropriate, Congruent and Tearful  Mood:  anxious  Thought process:  circumstantial  Thought content:    Rumination  Sensory/Perceptual disturbances:    WNL  Orientation:  x4  Attention:  Good  Concentration:  Good  Memory:  WNL  Fund of knowledge:   Good  Insight:    Good  Judgment:   Good  Impulse Control:  Good   Risk Assessment: Danger to Self:  No. Denied.  Self-injurious Behavior: No Danger to Others: No Duty to Warn:no Physical Aggression / Violence:No  Access to Firearms a concern: No  Gang Involvement:No   Subjective: Client reported having her upcoming surgery/lumpectimy and feeling peaceful overall and more hopeful about her recovery. Client processed the overwhelm having slowed and being really grateful for her husband and feeling close to him. Client discussed noticing how she is improving emotionally and wanting to help him understand her more. Client also feels more alive and is living every moment more fully: client processed even submitting her short story and it being accepted for publication. Therapist used MI to support client and affirm her progress and her strengths, congratulating her for her achievement. Therapist assessed client's safety and client denied SI/HI/AVH.   Interventions: Motivational Interviewing  Diagnosis:   ICD-10-CM   1. GAD (generalized anxiety disorder)  F41.1      Plan of Care: Client to return for weekly therapy with Zoila Shutter, therapist, to review again in 6 months. Client is to continue seeing medication provider for support of mood  management.   Client to engage in CBT: challenging negative internal ruminationsand self-talk AEB journaling daily or expressing thoughts to support persons in their life and then challenging it with truth.  Client to engage in tapping to tap in healthy cognition that challenges negative rumination or deep negative core belief.  Client to practice DBT distress tolerance skills to decrease crying spells and thoughts of not being able to endure their suffering AEB using mindfulness, deep breathing, and TIP to increase tolerance for discomfort, discharge emotional distress, and increase their understanding that they can do hard things.  Client to utilize BSP (brainspotting) with therapist to help client identify and process triggers for their depressive episode and anxiety with goal of reducing said SUDs caused by depression/anxiety by 33% in the next 6 months. Client to prioritize sleep 8+ hours each week night AEB going to bed by 10pm each night.  Pauline Good, LCSW, LCAS, CCTP, CCS-I, BSP

## 2020-03-07 ENCOUNTER — Encounter (HOSPITAL_COMMUNITY): Payer: Self-pay

## 2020-03-07 ENCOUNTER — Encounter (HOSPITAL_COMMUNITY)
Admission: RE | Admit: 2020-03-07 | Discharge: 2020-03-07 | Disposition: A | Discharge status: Home / Self Care | Payer: 59 | Source: Ambulatory Visit | Attending: Surgery | Admitting: Surgery

## 2020-03-07 ENCOUNTER — Other Ambulatory Visit: Payer: Self-pay

## 2020-03-07 DIAGNOSIS — F419 Anxiety disorder, unspecified: Secondary | ICD-10-CM | POA: Diagnosis not present

## 2020-03-07 DIAGNOSIS — Z79899 Other long term (current) drug therapy: Secondary | ICD-10-CM | POA: Insufficient documentation

## 2020-03-07 DIAGNOSIS — E039 Hypothyroidism, unspecified: Secondary | ICD-10-CM | POA: Diagnosis not present

## 2020-03-07 DIAGNOSIS — Z01812 Encounter for preprocedural laboratory examination: Secondary | ICD-10-CM | POA: Diagnosis present

## 2020-03-07 DIAGNOSIS — C50912 Malignant neoplasm of unspecified site of left female breast: Secondary | ICD-10-CM | POA: Insufficient documentation

## 2020-03-07 DIAGNOSIS — F329 Major depressive disorder, single episode, unspecified: Secondary | ICD-10-CM | POA: Diagnosis not present

## 2020-03-07 HISTORY — DX: Unspecified osteoarthritis, unspecified site: M19.90

## 2020-03-07 LAB — CBC WITH DIFFERENTIAL/PLATELET
Abs Immature Granulocytes: 0.01 10*3/uL (ref 0.00–0.07)
Basophils Absolute: 0 10*3/uL (ref 0.0–0.1)
Basophils Relative: 1 %
Eosinophils Absolute: 0.1 10*3/uL (ref 0.0–0.5)
Eosinophils Relative: 1 %
HCT: 43.3 % (ref 36.0–46.0)
Hemoglobin: 14.2 g/dL (ref 12.0–15.0)
Immature Granulocytes: 0 %
Lymphocytes Relative: 30 %
Lymphs Abs: 1.6 10*3/uL (ref 0.7–4.0)
MCH: 30.7 pg (ref 26.0–34.0)
MCHC: 32.8 g/dL (ref 30.0–36.0)
MCV: 93.7 fL (ref 80.0–100.0)
Monocytes Absolute: 0.4 10*3/uL (ref 0.1–1.0)
Monocytes Relative: 8 %
Neutro Abs: 3.2 10*3/uL (ref 1.7–7.7)
Neutrophils Relative %: 60 %
Platelets: 183 10*3/uL (ref 150–400)
RBC: 4.62 MIL/uL (ref 3.87–5.11)
RDW: 12 % (ref 11.5–15.5)
WBC: 5.3 10*3/uL (ref 4.0–10.5)
nRBC: 0 % (ref 0.0–0.2)

## 2020-03-07 LAB — COMPREHENSIVE METABOLIC PANEL
ALT: 24 U/L (ref 0–44)
AST: 26 U/L (ref 15–41)
Albumin: 4.2 g/dL (ref 3.5–5.0)
Alkaline Phosphatase: 41 U/L (ref 38–126)
Anion gap: 9 (ref 5–15)
BUN: 15 mg/dL (ref 6–20)
CO2: 30 mmol/L (ref 22–32)
Calcium: 9.3 mg/dL (ref 8.9–10.3)
Chloride: 102 mmol/L (ref 98–111)
Creatinine, Ser: 0.91 mg/dL (ref 0.44–1.00)
GFR, Estimated: >60 mL/min (ref >60)
Glucose, Bld: 99 mg/dL (ref 70–99)
Potassium: 4.4 mmol/L (ref 3.5–5.1)
Sodium: 141 mmol/L (ref 135–145)
Total Bilirubin: 0.6 mg/dL (ref 0.3–1.2)
Total Protein: 6.8 g/dL (ref 6.5–8.1)

## 2020-03-07 MED ORDER — CHLORHEXIDINE GLUCONATE CLOTH 2 % EX PADS: 6.0000 | MEDICATED_PAD | Freq: Once | CUTANEOUS | Status: DC

## 2020-03-07 NOTE — Pre-Procedure Instructions (Signed)
Michaela Roman  03/07/2020      [facility] - [place], [place] - [address] AT [address] [place] [address] Phone: [phone] Fax: [phone]  [place], [address] [place] [address] Phone: [phone] Fax: [phone]  [place], [place] - [address] [place] [address] Phone: [phone] Fax: [phone]    Your procedure is scheduled on 03/12/20.  Report to [facility] Admitting at 1:30 pm  Call this number if you have problems the morning of surgery:  [phone]   Remember:  Do not eat or drink after midnight.  You may drink clear liquids until 1030am .  Clear liquids allowed are:                    Juice (non-citric and without pulp - diabetics please choose diet or no sugar options), Carbonated beverages - (diabetics please choose diet or no sugar options), Clear Tea and Black Coffee only (no creamer, milk or cream including half and half)    Take these medicines the morning of surgery with A SIP OF WATER -----LEXAPRO,SYNTHROID    Do not wear jewelry, make-up or nail polish.  Do not wear lotions, powders, or perfumes, or deodorant.  Do not shave 48 hours prior to surgery.  Men may shave face and neck.  Do not bring valuables to the hospital.  [facility] is not responsible for any belongings or valuables.  Contacts, dentures or bridgework may not be worn into surgery.  Leave your suitcase in the car.  After surgery it may be brought to your room.  For patients admitted to the hospital, discharge time will be determined by your treatment team.  Patients discharged the day of surgery will not be allowed to drive home.    Special instructions:  Do not take any aspirin,anti-inflammatories,vitamins,or herbal supplements 5-7 days prior to  surgery.Please complete your PRE-SURGERY ENSURE that was provided to you by ... the morning of surgery.  Please, if able, drink it in one setting. DO NOT SIP.[facility] - Preparing for Surgery  Before surgery, you can play an important role.  Because skin is not sterile, your skin needs to be as free of germs as possible.  You can reduce the number of germs on you skin by washing with CHG (chlorahexidine gluconate) soap before surgery.  CHG is an antiseptic cleaner which kills germs and bonds with the skin to continue killing germs even after washing.  Oral Hygiene is also important in reducing the risk of infection.  Remember to brush your teeth with your regular toothpaste the morning of surgery.  Please DO NOT use if you have an allergy to CHG or antibacterial soaps.  If your skin becomes reddened/irritated stop using the CHG and inform your nurse when you arrive at Short Stay.  Do not shave (including legs and underarms) for at least 48 hours prior to the first CHG shower.  You may shave your face.  Please follow these instructions carefully:   1.  Shower with CHG Soap the night before surgery and the morning of Surgery.  2.  If you choose to wash your hair, wash your hair first as usual with your normal shampoo.  3.  After you shampoo, rinse your hair and body thoroughly to remove  the shampoo. 4.  Use CHG as you would any other liquid soap.  You can apply chg directly to the skin and wash gently with a      scrungie or washcloth.           5.  Apply the CHG Soap to your body ONLY FROM THE NECK DOWN.   Do not use on open wounds or open sores. Avoid contact with your eyes, ears, mouth and genitals (private parts).  Wash genitals (private parts) with your normal soap.  6.  Wash thoroughly, paying special attention to the area where your surgery will be performed.  7.  Thoroughly rinse your body with warm water from the neck down.  8.  DO NOT shower/wash with your normal soap after using and  rinsing off the CHG Soap.  9.  Pat yourself dry with a clean towel.            10.  Wear clean pajamas.            11.  Place clean sheets on your bed the night of your first shower and do not sleep with pets.  Day of Surgery  Do not apply any lotions/deoderants the morning of surgery.   Please wear clean clothes to the hospital/surgery center. Remember to brush your teeth with toothpaste.    Please read over the following fact sheets that you were given. Coughing and Deep Breathing

## 2020-03-07 NOTE — Progress Notes (Signed)
PCP - DR Quinn Axe AT [facility] Cardiologist - NA           ERAS Protcol -YES PRE-SURGERY Ensure or G2- DRINK GIVEN WITH INSTRUCTIONS  COVID TEST- 03/08/20   Anesthesia review: NA  Patient denies shortness of breath, fever, cough and chest pain at PAT appointment   All instructions explained to the patient, with a verbal understanding of the material. Patient agrees to go over the instructions while at home for a better understanding. Patient also instructed to self quarantine after being tested for COVID-19. The opportunity to ask questions was provided.

## 2020-03-08 ENCOUNTER — Other Ambulatory Visit (HOSPITAL_COMMUNITY)
Admission: RE | Admit: 2020-03-08 | Discharge: 2020-03-08 | Disposition: A | Discharge status: Home / Self Care | Payer: 59 | Source: Ambulatory Visit | Attending: Surgery | Admitting: Surgery

## 2020-03-08 DIAGNOSIS — Z20822 Contact with and (suspected) exposure to covid-19: Secondary | ICD-10-CM | POA: Diagnosis not present

## 2020-03-08 DIAGNOSIS — Z01812 Encounter for preprocedural laboratory examination: Secondary | ICD-10-CM | POA: Diagnosis present

## 2020-03-09 LAB — SARS CORONAVIRUS 2 (TAT 6-24 HRS): SARS Coronavirus 2: NEGATIVE

## 2020-03-10 NOTE — Anesthesia Preprocedure Evaluation (Addendum)
Anesthesia Evaluation  Patient identified by MRN, date of birth, ID band Patient awake    Reviewed: Allergy & Precautions, NPO status , Patient's Chart, lab work & pertinent test results  Airway Mallampati: II  TM Distance: >3 FB Neck ROM: Full    Dental no notable dental hx.    Pulmonary neg pulmonary ROS,    Pulmonary exam normal breath sounds clear to auscultation       Cardiovascular negative cardio ROS Normal cardiovascular exam Rhythm:Regular Rate:Normal     Neuro/Psych PSYCHIATRIC DISORDERS Anxiety Depression negative neurological ROS     GI/Hepatic negative GI ROS, Neg liver ROS,   Endo/Other  Hypothyroidism   Renal/GU negative Renal ROS  negative genitourinary   Musculoskeletal  (+) Arthritis , Osteoarthritis,    Abdominal   Peds  Hematology negative hematology ROS (+)   Anesthesia Other Findings L breast ca   Reproductive/Obstetrics negative OB ROS                           Anesthesia Physical Anesthesia Plan  ASA: II  Anesthesia Plan: General and Regional   Post-op Pain Management: GA combined w/ Regional for post-op pain   Induction: Intravenous  PONV Risk Score and Plan: 4 or greater and Ondansetron, Aprepitant, Midazolam and Dexamethasone  Airway Management Planned: LMA  Additional Equipment:   Intra-op Plan:   Post-operative Plan: Extubation in OR  Informed Consent: I have reviewed the patients History and Physical, chart, labs and discussed the procedure including the risks, benefits and alternatives for the proposed anesthesia with the patient or authorized representative who has indicated his/her understanding and acceptance.     Dental advisory given  Plan Discussed with: Anesthesiologist and CRNA  Anesthesia Plan Comments:       Anesthesia Quick Evaluation

## 2020-03-10 NOTE — Progress Notes (Signed)
Anesthesia Chart Review:  Case: [id number] Date/Time: 03/12/20 1515   Procedure: LEFT BREAST LUMPECTOMY WITH RADIOACTIVE SEED AND LEFT TARGETED RADIOACTIVE SEED LYMPH NODE BIOPSY AND LEFT SENTINEL LYMPH NODE MAPPING (Left Breast)   Anesthesia type: General   Pre-op diagnosis: LEFT BREAST CANCER   Location: [facility] OR ROOM 10 / [facility]   Surgeons: Erroll Luna, MD    Special Needs: SUPINE BCG 1/12/ @ 10:15 AM NUC MED 1/12 @ 3 PM ERAS   DISCUSSION: Patient is a 50 year old female scheduled for the above procedure.   History includes never smoker, hypothyroidism, anxiety, depression, premature ovarian failure (diagnosed age 79), left breast cancer (02/04/20 left breast biopsy: invasive ductal carcinoma, + left axilla LN), mutation HOXB13 gene.   Menstrual status is documented as postmenopausal with LMP 08/07/15.   By oncology notes, "Status post [place] x2 with booster October 2021". 03/08/2020 presurgical COVID-19 test negative.  Anesthesia team to evaluate on the day of surgery.   VS: BP 111/69   Pulse 60   Temp 36.8 C (Oral)   Resp 18   Ht 5\' 3"  (1.6 m)   Wt 68.5 kg   LMP 08/07/2015 (Approximate)   SpO2 99%   BMI 26.77 kg/m     PROVIDERS: Glenis Smoker, MD is PCP  Magrinat, Sarajane Jews, MD is HEM-ONC Gery Pray, MD is RAD-ONC   LABS: Labs reviewed: Acceptable for surgery. (all labs ordered are listed, but only abnormal results are displayed)  Labs Reviewed  CBC WITH DIFFERENTIAL/PLATELET  COMPREHENSIVE METABOLIC PANEL    EKG: N/A   CV: N/A   Past Medical History:  Diagnosis Date  . Anxiety   . Arthritis    OP  . Breast cancer ([place])   . Depression   . Family history of breast cancer 02/13/2020  . Hormone disorder   . Hypothyroidism   . 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 Premature ovarian failure    Dx'd age 39  . Thyroid disease    hypothyroid    Past Surgical History:  Procedure Laterality Date  . DILATATION & CURETTAGE/HYSTEROSCOPY WITH MYOSURE  N/A 10/07/2015   Procedure: DILATATION & CURETTAGE/HYSTEROSCOPY WITH MYOSURE;  Surgeon: Nunzio Cobbs, MD;  Location: [facility];  Service: Gynecology;  Laterality: N/A;  endometrial polyp  . WISDOM TOOTH EXTRACTION      MEDICATIONS: . Ascorbic Acid (VITAMIN C) 1000 MG tablet  . calcium gluconate 500 MG tablet  . escitalopram (LEXAPRO) 5 MG tablet  . levothyroxine (SYNTHROID) 112 MCG tablet  . Multiple Vitamins-Minerals (MULTIVITAMIN PO)  . zinc gluconate 50 MG tablet   No current facility-administered medications for this encounter.    Myra Gianotti, PA-C Surgical Short Stay/Anesthesiology [facility] Phone [phone] [facility] Phone [phone] 03/10/2020 10:48 AM

## 2020-03-11 ENCOUNTER — Ambulatory Visit (INDEPENDENT_AMBULATORY_CARE_PROVIDER_SITE_OTHER): Payer: 59 | Admitting: Addiction (Substance Use Disorder)

## 2020-03-11 ENCOUNTER — Encounter: Payer: Self-pay | Admitting: Addiction (Substance Use Disorder)

## 2020-03-11 DIAGNOSIS — F411 Generalized anxiety disorder: Secondary | ICD-10-CM

## 2020-03-11 NOTE — Progress Notes (Signed)
[facility] Counselor/Therapist Progress Note  Patient ID: Michaela Roman, MRN: [id number],    Date: 03/11/2020  Time Spent:  27mins  Treatment Type: Individual Therapy  Reported Symptoms: nervous and trying to be hopeful.   Mental Status Exam:  Appearance:   Casual     Behavior:  Appropriate and Sharing  Motor:  Normal  Speech/Language:   Clear and Coherent and Normal Rate  Affect:  Appropriate and Congruent  Mood:  anxious and labile  Thought process:  circumstantial  Thought content:    Rumination  Sensory/Perceptual disturbances:    WNL  Orientation:  x4  Attention:  Good  Concentration:  Good  Memory:  WNL  Fund of knowledge:   Good  Insight:    Good  Judgment:   Good  Impulse Control:  Good   Risk Assessment: Danger to Self:  No. Denied.  Self-injurious Behavior: No Danger to Others: No Duty to Warn:no Physical Aggression / Violence:No  Access to Firearms a concern: No  Gang Involvement:No   Virtual Visit via VIDEO:  I connected with client by MyChart video enabled telemedicine/telehealth application, with their informed consent, and verified client privacy and that I am speaking with the correct person using two identifiers. I discussed the limitations, risks, security and privacy concerns of performing psychotherapy and management service virtually and confirmed their location. I also discussed with the patient that there may be a patient responsible charge related to this service and to confirm with the front desk if their insurance covers teletherapy. I also discussed with the patient the availability of in person appointments. The patient expressed understanding and agreed to proceed. I discussed the treatment planning with the client. The client was provided an opportunity to ask questions and all were answered. The client agreed with the plan and demonstrated an understanding of the instructions. The client was advised to call our office if  symptoms worsen or feel they are in a crisis state and need immediate contact. Client also reminded of a crisis line number and to use 9-1-1 if there's an emergency.  Therapist Location: office; Client Location: home.  Subjective: Client reported increased nerves as she approaches her breast surgery tomorrow. Client nervous about the procedure itself and her need to disassociate. Client reported increased support from her mom, friends, and husband and those things as supports for her. Therapist used MI & CBT with client to support her and encourage her to see them as a refuge she can call on when feeling weak. Therapist also used mindfulness with client to do a meditation and grounding exercise to help her imagine a safe place she can go to mentally during the surgery. Therapist assessed client's safety and client denied SI/HI/AVH.   Interventions: Cognitive Behavioral Therapy, Mindfulness Meditation and Motivational Interviewing  Diagnosis:   ICD-10-CM   1. GAD (generalized anxiety disorder)  F41.1      Plan of Care: Client to return for weekly therapy with Sammuel Cooper, therapist, to review again in 6 months. Client is to continue seeing medication provider for support of mood management.   Client to engage in CBT: challenging negative internal ruminationsand self-talk AEB journaling daily or expressing thoughts to support persons in their life and then challenging it with truth.  Client to engage in tapping to tap in healthy cognition that challenges negative rumination or deep negative core belief.  Client to practice DBT distress tolerance skills to decrease crying spells and thoughts of not being able to endure  their suffering AEB using mindfulness, deep breathing, and TIP to increase tolerance for discomfort, discharge emotional distress, and increase their understanding that they can do hard things.  Client to utilize BSP (brainspotting) with therapist to help client identify and process  triggers for their depressive episode and anxiety with goal of reducing said SUDs caused by depression/anxiety by 33% in the next 6 months. Client to prioritize sleep 8+ hours each week night AEB going to bed by 10pm each night.  Barnie Del, LCSW, LCAS, CCTP, CCS-I, BSP

## 2020-03-12 ENCOUNTER — Encounter (HOSPITAL_COMMUNITY)
Admission: RE | Disposition: A | Discharge status: Home / Self Care | Payer: Self-pay | Source: Home / Self Care | Attending: Surgery

## 2020-03-12 ENCOUNTER — Ambulatory Visit (HOSPITAL_COMMUNITY): Payer: 59 | Admitting: Anesthesiology

## 2020-03-12 ENCOUNTER — Ambulatory Visit (HOSPITAL_COMMUNITY)
Admission: RE | Admit: 2020-03-12 | Discharge: 2020-03-12 | Disposition: A | Discharge status: Home / Self Care | Payer: 59 | Source: Ambulatory Visit | Attending: Surgery | Admitting: Surgery

## 2020-03-12 ENCOUNTER — Encounter (HOSPITAL_COMMUNITY): Payer: Self-pay | Admitting: Surgery

## 2020-03-12 ENCOUNTER — Ambulatory Visit (HOSPITAL_COMMUNITY): Payer: 59 | Admitting: Vascular Surgery

## 2020-03-12 ENCOUNTER — Ambulatory Visit
Admission: RE | Admit: 2020-03-12 | Discharge: 2020-03-12 | Disposition: A | Discharge status: Home / Self Care | Payer: 59 | Source: Ambulatory Visit | Attending: Surgery | Admitting: Surgery

## 2020-03-12 ENCOUNTER — Ambulatory Visit (HOSPITAL_COMMUNITY)
Admission: RE | Admit: 2020-03-12 | Discharge: 2020-03-12 | Disposition: A | Discharge status: Home / Self Care | Payer: 59 | Attending: Surgery | Admitting: Surgery

## 2020-03-12 ENCOUNTER — Other Ambulatory Visit: Payer: Self-pay | Admitting: Surgery

## 2020-03-12 ENCOUNTER — Other Ambulatory Visit: Payer: Self-pay

## 2020-03-12 ENCOUNTER — Other Ambulatory Visit: Payer: Self-pay | Admitting: Psychiatry

## 2020-03-12 DIAGNOSIS — C50912 Malignant neoplasm of unspecified site of left female breast: Secondary | ICD-10-CM

## 2020-03-12 DIAGNOSIS — C50412 Malignant neoplasm of upper-outer quadrant of left female breast: Secondary | ICD-10-CM | POA: Insufficient documentation

## 2020-03-12 DIAGNOSIS — C773 Secondary and unspecified malignant neoplasm of axilla and upper limb lymph nodes: Secondary | ICD-10-CM | POA: Insufficient documentation

## 2020-03-12 HISTORY — PX: BREAST LUMPECTOMY WITH RADIOACTIVE SEED AND SENTINEL LYMPH NODE BIOPSY: SHX6550

## 2020-03-12 HISTORY — PX: BREAST LUMPECTOMY: SHX2

## 2020-03-12 SURGERY — BREAST LUMPECTOMY WITH RADIOACTIVE SEED AND SENTINEL LYMPH NODE BIOPSY
Anesthesia: Regional | Site: Breast | Laterality: Left

## 2020-03-12 MED ORDER — GABAPENTIN 300 MG PO CAPS
ORAL_CAPSULE | ORAL | Status: AC
  Filled 2020-03-12: qty 1

## 2020-03-12 MED ORDER — BUPIVACAINE LIPOSOME 1.3 % IJ SUSP
INTRAMUSCULAR | Status: DC | PRN
Start: 2020-03-12 — End: 2020-03-12
  Administered 2020-03-12: 10 mL

## 2020-03-12 MED ORDER — BUPIVACAINE HCL (PF) 0.25 % IJ SOLN
INTRAMUSCULAR | Status: DC | PRN
  Administered 2020-03-12: 15 mL via EPIDURAL

## 2020-03-12 MED ORDER — PROPOFOL 10 MG/ML IV BOLUS
INTRAVENOUS | Status: DC | PRN
  Administered 2020-03-12: 200 mg via INTRAVENOUS

## 2020-03-12 MED ORDER — GLYCOPYRROLATE 0.2 MG/ML IJ SOLN
INTRAMUSCULAR | Status: DC | PRN
  Administered 2020-03-12 (×2): .1 mg via INTRAVENOUS

## 2020-03-12 MED ORDER — ACETAMINOPHEN 500 MG PO TABS
1000.0000 mg | ORAL_TABLET | ORAL | Status: AC
  Administered 2020-03-12: 1000 mg via ORAL
  Filled 2020-03-12: qty 2

## 2020-03-12 MED ORDER — ORAL CARE MOUTH RINSE: 15.0000 mL | Freq: Once | OROMUCOSAL | Status: AC

## 2020-03-12 MED ORDER — BUPIVACAINE HCL (PF) 0.25 % IJ SOLN
INTRAMUSCULAR | Status: AC
  Filled 2020-03-12: qty 30

## 2020-03-12 MED ORDER — FENTANYL CITRATE (PF) 100 MCG/2ML IJ SOLN
INTRAMUSCULAR | Status: AC
  Administered 2020-03-12: 100 ug via INTRAVENOUS
  Filled 2020-03-12: qty 2

## 2020-03-12 MED ORDER — OXYCODONE HCL 5 MG PO TABS: 5.0000 mg | ORAL_TABLET | Freq: Once | ORAL | Status: DC

## 2020-03-12 MED ORDER — HEMOSTATIC AGENTS (NO CHARGE) OPTIME
TOPICAL | Status: DC | PRN
  Administered 2020-03-12: 1 via TOPICAL

## 2020-03-12 MED ORDER — 0.9 % SODIUM CHLORIDE (POUR BTL) OPTIME
TOPICAL | Status: DC | PRN
  Administered 2020-03-12: 1000 mL

## 2020-03-12 MED ORDER — PROPOFOL 10 MG/ML IV BOLUS
INTRAVENOUS | Status: AC
  Filled 2020-03-12: qty 20

## 2020-03-12 MED ORDER — MIDAZOLAM HCL 2 MG/2ML IJ SOLN
INTRAMUSCULAR | Status: AC
  Administered 2020-03-12: 2 mg via INTRAVENOUS
  Filled 2020-03-12: qty 2

## 2020-03-12 MED ORDER — METHYLENE BLUE 0.5 % INJ SOLN
INTRAVENOUS | Status: AC
  Filled 2020-03-12: qty 10

## 2020-03-12 MED ORDER — FENTANYL CITRATE (PF) 100 MCG/2ML IJ SOLN: 25.0000 ug | INTRAMUSCULAR | Status: DC | PRN

## 2020-03-12 MED ORDER — LIDOCAINE 2% (20 MG/ML) 5 ML SYRINGE
INTRAMUSCULAR | Status: DC | PRN
  Administered 2020-03-12: 60 mg via INTRAVENOUS

## 2020-03-12 MED ORDER — MIDAZOLAM HCL 2 MG/2ML IJ SOLN
INTRAMUSCULAR | Status: AC
  Filled 2020-03-12: qty 2

## 2020-03-12 MED ORDER — OXYCODONE HCL 5 MG PO TABS
ORAL_TABLET | ORAL | Status: AC
  Filled 2020-03-12: qty 1

## 2020-03-12 MED ORDER — MIDAZOLAM HCL 2 MG/2ML IJ SOLN: 2.0000 mg | Freq: Once | INTRAMUSCULAR | Status: AC

## 2020-03-12 MED ORDER — GABAPENTIN 300 MG PO CAPS
300.0000 mg | ORAL_CAPSULE | ORAL | Status: AC
  Administered 2020-03-12: 300 mg via ORAL
  Filled 2020-03-12: qty 1

## 2020-03-12 MED ORDER — ACETAMINOPHEN 500 MG PO TABS
ORAL_TABLET | ORAL | Status: AC
  Filled 2020-03-12: qty 2

## 2020-03-12 MED ORDER — FENTANYL CITRATE (PF) 100 MCG/2ML IJ SOLN
INTRAMUSCULAR | Status: AC
  Administered 2020-03-12: 50 ug via INTRAVENOUS
  Filled 2020-03-12: qty 2

## 2020-03-12 MED ORDER — CEFAZOLIN SODIUM-DEXTROSE 2-4 GM/100ML-% IV SOLN
2.0000 g | INTRAVENOUS | Status: AC
  Administered 2020-03-12: 2 g via INTRAVENOUS
  Filled 2020-03-12: qty 100

## 2020-03-12 MED ORDER — BUPIVACAINE HCL (PF) 0.25 % IJ SOLN
INTRAMUSCULAR | Status: DC | PRN
  Administered 2020-03-12: 10 mL

## 2020-03-12 MED ORDER — SCOPOLAMINE 1 MG/3DAYS TD PT72
MEDICATED_PATCH | TRANSDERMAL | Status: AC
  Administered 2020-03-12: 1.5 mg via TRANSDERMAL
  Filled 2020-03-12: qty 1

## 2020-03-12 MED ORDER — MIDAZOLAM HCL 5 MG/5ML IJ SOLN
INTRAMUSCULAR | Status: DC | PRN
  Administered 2020-03-12: 2 mg via INTRAVENOUS

## 2020-03-12 MED ORDER — TECHNETIUM TC 99M MEDRONATE IV KIT: 20.0000 | PACK | Freq: Once | INTRAVENOUS | Status: DC | PRN

## 2020-03-12 MED ORDER — FENTANYL CITRATE (PF) 100 MCG/2ML IJ SOLN
INTRAMUSCULAR | Status: DC | PRN
  Administered 2020-03-12: 50 ug via INTRAVENOUS

## 2020-03-12 MED ORDER — EPHEDRINE SULFATE 50 MG/ML IJ SOLN
INTRAMUSCULAR | Status: DC | PRN
  Administered 2020-03-12: 10 mg via INTRAVENOUS

## 2020-03-12 MED ORDER — PROMETHAZINE HCL 25 MG/ML IJ SOLN: 6.2500 mg | INTRAMUSCULAR | Status: DC | PRN

## 2020-03-12 MED ORDER — CEFAZOLIN SODIUM-DEXTROSE 2-4 GM/100ML-% IV SOLN
INTRAVENOUS | Status: AC
  Filled 2020-03-12: qty 100

## 2020-03-12 MED ORDER — OXYCODONE HCL 5 MG PO TABS: 5.0000 mg | ORAL_TABLET | Freq: Four times a day (QID) | ORAL | 0 refills | Status: DC | PRN

## 2020-03-12 MED ORDER — CHLORHEXIDINE GLUCONATE 0.12 % MT SOLN: 15.0000 mL | Freq: Once | OROMUCOSAL | Status: AC

## 2020-03-12 MED ORDER — DEXAMETHASONE SODIUM PHOSPHATE 10 MG/ML IJ SOLN
INTRAMUSCULAR | Status: DC | PRN
  Administered 2020-03-12: 10 mg via INTRAVENOUS

## 2020-03-12 MED ORDER — IBUPROFEN 800 MG PO TABS: 800.0000 mg | ORAL_TABLET | Freq: Three times a day (TID) | ORAL | 0 refills | Status: DC | PRN

## 2020-03-12 MED ORDER — FENTANYL CITRATE (PF) 100 MCG/2ML IJ SOLN: 100.0000 ug | Freq: Once | INTRAMUSCULAR | Status: AC

## 2020-03-12 MED ORDER — LACTATED RINGERS IV SOLN: INTRAVENOUS | Status: DC

## 2020-03-12 MED ORDER — CHLORHEXIDINE GLUCONATE 0.12 % MT SOLN
OROMUCOSAL | Status: AC
  Filled 2020-03-12: qty 15

## 2020-03-12 MED ORDER — TECHNETIUM TC 99M TILMANOCEPT KIT
1.0000 | PACK | Freq: Once | INTRAVENOUS | Status: AC | PRN
  Administered 2020-03-12: 1 via INTRADERMAL

## 2020-03-12 MED ORDER — FENTANYL CITRATE (PF) 250 MCG/5ML IJ SOLN
INTRAMUSCULAR | Status: AC
  Filled 2020-03-12: qty 5

## 2020-03-12 MED ORDER — SCOPOLAMINE 1 MG/3DAYS TD PT72
1.0000 | MEDICATED_PATCH | Freq: Once | TRANSDERMAL | Status: DC
  Filled 2020-03-12: qty 1

## 2020-03-12 MED ORDER — APREPITANT 40 MG PO CAPS: 40.0000 mg | ORAL_CAPSULE | Freq: Once | ORAL | Status: DC

## 2020-03-12 MED ORDER — CHLORHEXIDINE GLUCONATE 0.12 % MT SOLN
OROMUCOSAL | Status: AC
  Administered 2020-03-12: 15 mL
  Filled 2020-03-12: qty 15

## 2020-03-12 MED ORDER — ONDANSETRON HCL 4 MG/2ML IJ SOLN
INTRAMUSCULAR | Status: DC | PRN
  Administered 2020-03-12: 4 mg via INTRAVENOUS

## 2020-03-12 SURGICAL SUPPLY — 40 items
APPLIER CLIP 9.375 MED OPEN (MISCELLANEOUS) ×2
BINDER BREAST LRG (GAUZE/BANDAGES/DRESSINGS) ×2 IMPLANT
BINDER BREAST XLRG (GAUZE/BANDAGES/DRESSINGS) IMPLANT
CANISTER SUCT 3000ML PPV (MISCELLANEOUS) ×2 IMPLANT
CHLORAPREP W/TINT 26 (MISCELLANEOUS) ×2 IMPLANT
CLIP APPLIE 9.375 MED OPEN (MISCELLANEOUS) ×1 IMPLANT
CNTNR URN SCR LID CUP LEK RST (MISCELLANEOUS) ×2 IMPLANT
CONT SPEC 4OZ STRL OR WHT (MISCELLANEOUS) ×2
COVER PROBE W GEL 5X96 (DRAPES) ×2 IMPLANT
COVER SURGICAL LIGHT HANDLE (MISCELLANEOUS) ×2 IMPLANT
COVER WAND RF STERILE (DRAPES) IMPLANT
DERMABOND ADVANCED (GAUZE/BANDAGES/DRESSINGS) ×1
DERMABOND ADVANCED .7 DNX12 (GAUZE/BANDAGES/DRESSINGS) ×1 IMPLANT
DEVICE DUBIN SPECIMEN MAMMOGRA (MISCELLANEOUS) IMPLANT
DRAPE CHEST BREAST 15X10 FENES (DRAPES) ×2 IMPLANT
ELECT CAUTERY BLADE 6.4 (BLADE) ×2 IMPLANT
ELECT REM PT RETURN 9FT ADLT (ELECTROSURGICAL) ×2
ELECTRODE REM PT RTRN 9FT ADLT (ELECTROSURGICAL) ×1 IMPLANT
GLOVE BIO SURGEON STRL SZ8 (GLOVE) ×2 IMPLANT
GLOVE BIOGEL PI IND STRL 8 (GLOVE) ×1 IMPLANT
GLOVE BIOGEL PI INDICATOR 8 (GLOVE) ×1
GOWN STRL REUS W/ TWL LRG LVL3 (GOWN DISPOSABLE) ×1 IMPLANT
GOWN STRL REUS W/ TWL XL LVL3 (GOWN DISPOSABLE) ×1 IMPLANT
GOWN STRL REUS W/TWL LRG LVL3 (GOWN DISPOSABLE) ×1
GOWN STRL REUS W/TWL XL LVL3 (GOWN DISPOSABLE) ×1
KIT BASIN OR (CUSTOM PROCEDURE TRAY) ×2 IMPLANT
KIT MARKER MARGIN INK (KITS) ×2 IMPLANT
LIGHT WAVEGUIDE WIDE FLAT (MISCELLANEOUS) IMPLANT
NEEDLE 18GX1X1/2 (RX/OR ONLY) (NEEDLE) IMPLANT
NEEDLE FILTER BLUNT 18X 1/2SAF (NEEDLE)
NEEDLE FILTER BLUNT 18X1 1/2 (NEEDLE) IMPLANT
NEEDLE HYPO 25GX1X1/2 BEV (NEEDLE) ×2 IMPLANT
NS IRRIG 1000ML POUR BTL (IV SOLUTION) ×2 IMPLANT
PACK GENERAL/GYN (CUSTOM PROCEDURE TRAY) ×2 IMPLANT
PAD ABD 8X10 STRL (GAUZE/BANDAGES/DRESSINGS) ×2 IMPLANT
SUT MNCRL AB 4-0 PS2 18 (SUTURE) ×2 IMPLANT
SUT VIC AB 3-0 SH 18 (SUTURE) ×2 IMPLANT
SYR CONTROL 10ML LL (SYRINGE) ×2 IMPLANT
TOWEL GREEN STERILE (TOWEL DISPOSABLE) ×2 IMPLANT
TOWEL GREEN STERILE FF (TOWEL DISPOSABLE) ×2 IMPLANT

## 2020-03-12 NOTE — Discharge Instructions (Signed)
[facility] °Office Phone Number [phone] ° °BREAST BIOPSY/ PARTIAL MASTECTOMY: POST OP INSTRUCTIONS ° °Always review your discharge instruction sheet given to you by the facility where your surgery was performed. ° °IF YOU HAVE DISABILITY OR FAMILY LEAVE FORMS, YOU MUST BRING THEM TO THE OFFICE FOR PROCESSING.  DO NOT GIVE THEM TO YOUR DOCTOR. ° °1. A prescription for pain medication may be given to you upon discharge.  Take your pain medication as prescribed, if needed.  If narcotic pain medicine is not needed, then you may take acetaminophen (Tylenol) or ibuprofen (Advil) as needed. °2. Take your usually prescribed medications unless otherwise directed °3. If you need a refill on your pain medication, please contact your pharmacy.  They will contact our office to request authorization.  Prescriptions will not be filled after 5pm or on week-ends. °4. You should eat very light the first 24 hours after surgery, such as soup, crackers, pudding, etc.  Resume your normal diet the day after surgery. °5. Most patients will experience some swelling and bruising in the breast.  Ice packs and a good support bra will help.  Swelling and bruising can take several days to resolve.  °6. It is common to experience some constipation if taking pain medication after surgery.  Increasing fluid intake and taking a stool softener will usually help or prevent this problem from occurring.  A mild laxative (Milk of Magnesia or Miralax) should be taken according to package directions if there are no bowel movements after 48 hours. °7. Unless discharge instructions indicate otherwise, you may remove your bandages 24-48 hours after surgery, and you may shower at that time.  You may have steri-strips (small skin tapes) in place directly over the incision.  These strips should be left on the skin for 7-10 days.  If your surgeon used skin glue on the incision, you may shower in 24 hours.  The glue will flake off over the  next 2-3 weeks.  Any sutures or staples will be removed at the office during your follow-up visit. °8. ACTIVITIES:  You may resume regular daily activities (gradually increasing) beginning the next day.  Wearing a good support bra or sports bra minimizes pain and swelling.  You may have sexual intercourse when it is comfortable. °a. You may drive when you no longer are taking prescription pain medication, you can comfortably wear a seatbelt, and you can safely maneuver your car and apply brakes. °b. RETURN TO WORK:  ______________________________________________________________________________________ °9. You should see your doctor in the office for a follow-up appointment approximately two weeks after your surgery.  Your doctor’s nurse will typically make your follow-up appointment when she calls you with your pathology report.  Expect your pathology report 2-3 business days after your surgery.  You may call to check if you do not hear from us after three days. °10. OTHER INSTRUCTIONS: _______________________________________________________________________________________________ _____________________________________________________________________________________________________________________________________ °_____________________________________________________________________________________________________________________________________ °_____________________________________________________________________________________________________________________________________ ° °WHEN TO CALL YOUR DOCTOR: °1. Fever over 101.0 °2. Nausea and/or vomiting. °3. Extreme swelling or bruising. °4. Continued bleeding from incision. °5. Increased pain, redness, or drainage from the incision. ° °The clinic staff is available to answer your questions during regular business hours.  Please don’t hesitate to call and ask to speak to one of the nurses for clinical concerns.  If you have a medical emergency, go to the nearest  emergency room or call 911.  A surgeon from [facility] is always on call at the hospital. ° °For further questions, please visit [email or web address]  °

## 2020-03-12 NOTE — Op Note (Signed)
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  2 additional sentinel nodes into pathology also additional and medial margin  IV fluids: Per anesthesia record  EBL: Minimal  Indications for procedure: The patient is a 50 year old female with stage II left breast cancer.  She opted for breast conserving surgery but had a positive node therefore recommended targeted lymph node biopsy.Sentinel lymph node mapping and dissection has been discussed with the patient.  Risk of bleeding,  Infection,  Seroma formation,  Additional procedures,,  Lymphedema ,  Shoulder weakness ,  Shoulder stiffness,  Nerve and blood vessel injury and reaction to the mapping dyes have been discussed.  Alternatives to surgery have been discussed with the patient.  The patient agrees to proceed.  Description of procedure: The patient was met in the holding area and questions were answered.  Neoprobe used to verify both these left breast upper outer quadrant and left axilla.  She had a pectoral block placed anesthesia and underwent technetium sulfur colloid injection per [facility] medicine protocol.  All questions were answered.  Left side is marked as correct site.  She was brought back to the operating.  She is placed upon upon the OR table.  After induction of general esthesia left breast was prepped and draped in sterile fashion timeout performed to verify proper patient, site and procedure and she received appropriate preoperative antibiotics.  Lumpectomy performed first.  Neoprobe was used with  the assistance of the films.  Seed identified left breast at about 3:00.  This was just below the nipple-areolar complex.  Curvilinear incision made along the border of the nipple areolar complex.  Dissection was carried down this was densely adherent to the backside of the nipple.  I dissected this away and removed the mass.  Since this was closed to the backside of the nipple I took an additional anterior margin as well as an inferior margin.  The Faxitron revealed the seed and clip to be present.  Gross margins appear negative.  Irrigation was used.  Hemostasis achieved.  Clips were placed to mark the cavity and the cavity is closed with a deep layer 3-0 Vicryl and 4-0 Monocryl.  The targeted node dissection was done next.  Neoprobe used and the seed was identified in the left axilla.  Transverse incision was made.  Dissection was carried carried down and the lymph node with a seed and clip were identified and removed which were a level 1 node.  The Faxitron image revealed seed and clip be present.  Neoprobe settings changed to [employer].  There are 2 additional sentinel nodes identified and removed.  Background counts approached 0.  The medial pectoral nerve, long thoracic bundle and thoracodorsal bundle were identified and preserved.  Axillary vein identified and preserved.  Background counts approached 0.  Hemostasis achieved with cautery and Arista.  This was done after irrigation.  Cavity closed with 3-0 Vicryl and 4-0 Monocryl.  Dermabond applied.  Breast binder placed.  All counts were found to be correct.  The patient was awoke extubated taken to recovery in satisfactory condition.

## 2020-03-12 NOTE — Anesthesia Procedure Notes (Signed)
Anesthesia Regional Block: Pectoralis block   Pre-Anesthetic Checklist: ,, timeout performed, Correct Patient, Correct Site, Correct Laterality, Correct Procedure, Correct Position, site marked, Risks and benefits discussed,  Surgical consent,  Pre-op evaluation,  At surgeon's request and post-op pain management  Laterality: Left  Prep: chloraprep       Needles:  Injection technique: Single-shot  Needle Type: Echogenic Stimulator Needle     Needle Length: 10cm  Needle Gauge: 21     Additional Needles:   Narrative:  Start time: 03/12/2020 2:38 PM End time: 03/12/2020 2:48 PM Injection made incrementally with aspirations every 5 mL.  Performed by: Personally

## 2020-03-12 NOTE — Transfer of Care (Signed)
Immediate Anesthesia Transfer of Care Note  Patient: Michaela Roman  Procedure(s) Performed: LEFT BREAST LUMPECTOMY WITH RADIOACTIVE SEED AND LEFT TARGETED RADIOACTIVE SEED LYMPH NODE BIOPSY AND LEFT SENTINEL LYMPH NODE MAPPING (Left Breast)  Patient Location: PACU  Anesthesia Type:General and Regional  Level of Consciousness: responds to stimulation  Airway & Oxygen Therapy: Patient Spontanous Breathing and Patient connected to face mask oxygen  Post-op Assessment: Report given to RN and Post -op Vital signs reviewed and stable  Post vital signs: Reviewed and stable  Last Vitals:  Vitals Value Taken Time  BP 105/64 03/12/20 1724  Temp    Pulse 85 03/12/20 1727  Resp 13 03/12/20 1727  SpO2 100 % 03/12/20 1727  Vitals shown include unvalidated device data.  Last Pain:  Vitals:   03/12/20 1450  TempSrc:   PainSc: 1          Complications: No complications documented.

## 2020-03-12 NOTE — Anesthesia Procedure Notes (Signed)
Procedure Name: LMA Insertion Date/Time: 03/12/2020 3:53 PM Performed by: Babs Bertin, CRNA Pre-anesthesia Checklist: Patient identified, Emergency Drugs available, Suction available and Patient being monitored Patient Re-evaluated:Patient Re-evaluated prior to induction Oxygen Delivery Method: Circle System Utilized Preoxygenation: Pre-oxygenation with 100% oxygen Induction Type: IV induction Ventilation: Mask ventilation without difficulty LMA: LMA inserted LMA Size: 4.0 Number of attempts: 1 Airway Equipment and Method: Bite block Placement Confirmation: positive ETCO2 Tube secured with: Tape Dental Injury: Teeth and Oropharynx as per pre-operative assessment

## 2020-03-12 NOTE — Interval H&P Note (Signed)
History and Physical Interval Note:  03/12/2020 2:43 PM  Michaela Roman  has presented today for surgery, with the diagnosis of LEFT BREAST CANCER.  The various methods of treatment have been discussed with the patient and family. After consideration of risks, benefits and other options for treatment, the patient has consented to  Procedure(s): LEFT BREAST LUMPECTOMY WITH RADIOACTIVE SEED AND LEFT TARGETED RADIOACTIVE SEED LYMPH NODE BIOPSY AND LEFT SENTINEL LYMPH NODE MAPPING (Left) as a surgical intervention.  The patient's history has been reviewed, patient examined, no change in status, stable for surgery.  I have reviewed the patient's chart and labs.  Questions were answered to the patient's satisfaction.     Show Low

## 2020-03-13 ENCOUNTER — Encounter (HOSPITAL_COMMUNITY): Payer: Self-pay | Admitting: Surgery

## 2020-03-13 NOTE — Anesthesia Postprocedure Evaluation (Signed)
Anesthesia Post Note  Patient: Michaela Roman  Procedure(s) Performed: LEFT BREAST LUMPECTOMY WITH RADIOACTIVE SEED AND LEFT TARGETED RADIOACTIVE SEED LYMPH NODE BIOPSY AND LEFT SENTINEL LYMPH NODE MAPPING (Left Breast)     Patient location during evaluation: PACU Anesthesia Type: General Level of consciousness: awake and alert Pain management: pain level controlled Vital Signs Assessment: post-procedure vital signs reviewed and stable Respiratory status: spontaneous breathing, nonlabored ventilation, respiratory function stable and patient connected to nasal cannula oxygen Cardiovascular status: blood pressure returned to baseline and stable Postop Assessment: no apparent nausea or vomiting Anesthetic complications: no   No complications documented.  Last Vitals:  Vitals:   03/12/20 1810 03/12/20 1815  BP: 124/71   Pulse: (!) 54 (!) 57  Resp: 17 18  Temp:  (!) 36.3 C  SpO2: 100% 100%    Last Pain:  Vitals:   03/12/20 1810  TempSrc:   PainSc: 3                  Alianny Toelle

## 2020-03-14 ENCOUNTER — Encounter: Payer: Self-pay | Admitting: Oncology

## 2020-03-15 LAB — SURGICAL PATHOLOGY

## 2020-03-18 ENCOUNTER — Other Ambulatory Visit: Payer: Self-pay | Admitting: *Deleted

## 2020-03-18 ENCOUNTER — Encounter: Payer: Self-pay | Admitting: *Deleted

## 2020-03-18 ENCOUNTER — Telehealth: Payer: Self-pay | Admitting: *Deleted

## 2020-03-18 DIAGNOSIS — C50412 Malignant neoplasm of upper-outer quadrant of left female breast: Secondary | ICD-10-CM

## 2020-03-18 MED ORDER — LORAZEPAM 0.5 MG PO TABS: 0.5000 mg | ORAL_TABLET | Freq: Every day | ORAL | 0 refills | Status: DC

## 2020-03-18 NOTE — Telephone Encounter (Signed)
Pt called with c/o difficulty sleep with anxiety. Prescription called in to pharmacy.

## 2020-03-20 ENCOUNTER — Ambulatory Visit (INDEPENDENT_AMBULATORY_CARE_PROVIDER_SITE_OTHER): Payer: 59 | Admitting: Addiction (Substance Use Disorder)

## 2020-03-20 ENCOUNTER — Ambulatory Visit: Payer: 59 | Admitting: Addiction (Substance Use Disorder)

## 2020-03-20 ENCOUNTER — Encounter: Payer: Self-pay | Admitting: Addiction (Substance Use Disorder)

## 2020-03-20 DIAGNOSIS — F4323 Adjustment disorder with mixed anxiety and depressed mood: Secondary | ICD-10-CM

## 2020-03-20 DIAGNOSIS — F411 Generalized anxiety disorder: Secondary | ICD-10-CM

## 2020-03-20 NOTE — Progress Notes (Signed)
[facility] Counselor/Therapist Progress Note  Patient ID: Michaela Roman, MRN: [id number],    Date: 03/20/2020  Time Spent:  71mins  Treatment Type: Individual Therapy  Reported Symptoms: grieving, upset, tired.   Mental Status Exam:  Appearance:   Casual     Behavior:  Appropriate and Sharing  Motor:  Normal  Speech/Language:   Clear and Coherent and Normal Rate  Affect:  Appropriate and Congruent  Mood:  angry, anxious, labile and sad  Thought process:  circumstantial and flight of ideas  Thought content:    Rumination  Sensory/Perceptual disturbances:    WNL  Orientation:  x4  Attention:  Good  Concentration:  Good  Memory:  WNL  Fund of knowledge:   Good  Insight:    Good  Judgment:   Good  Impulse Control:  Good   Risk Assessment: Danger to Self:  No. Denied.  Self-injurious Behavior: No Danger to Others: No Duty to Warn:no Physical Aggression / Violence:No  Access to Firearms a concern: No  Gang Involvement:No   Virtual Visit via VIDEO: I connected with client by MyChart video enabled telemedicine/telehealth application, with their informed consent, and verified client privacy and that I am speaking with the correct person using two identifiers. I discussed the limitations, risks, security and privacy concerns of performing psychotherapy and management service virtually and confirmed their location. I also discussed with the patient that there may be a patient responsible charge related to this service and to confirm with the front desk if their insurance covers teletherapy. I also discussed with the patient the availability of in person appointments. The patient expressed understanding and agreed to proceed. I discussed the treatment planning with the client. The client was provided an opportunity to ask questions and all were answered. The client agreed with the plan and demonstrated an understanding of the instructions. The client was advised to call  our office if symptoms worsen or feel they are in a crisis state and need immediate contact. Client also reminded of a crisis line number and to use 9-1-1 if there's an emergency.  Therapist Location: office; Client Location: home.  Subjective: Client reported feeling upset, tired easily and grieving who she once was. Therapist used MI & BSP with client to help ground her, support her in processing her distress, and helping her tend to her tightness in her chest as she thought about who she didn't know she was anymore / how she felt different. Client able to process, cry, get upset and come back to a mindful baseline. Therapist assessed client's safety and client denied SI/HI/AVH.   Interventions: Motivational Interviewing and BSP  Diagnosis:   ICD-10-CM   1. GAD (generalized anxiety disorder)  F41.1   2. Adjustment disorder with mixed anxiety and depressed mood  F43.23      Plan of Care: Client to return for weekly therapy with Sammuel Cooper, therapist, to review again in 6 months. Client is to continue seeing medication provider for support of mood management.   Client to engage in CBT: challenging negative internal ruminationsand self-talk AEB journaling daily or expressing thoughts to support persons in their life and then challenging it with truth.  Client to engage in tapping to tap in healthy cognition that challenges negative rumination or deep negative core belief.  Client to practice DBT distress tolerance skills to decrease crying spells and thoughts of not being able to endure their suffering AEB using mindfulness, deep breathing, and TIP to increase tolerance for  discomfort, discharge emotional distress, and increase their understanding that they can do hard things.  Client to utilize BSP (brainspotting) with therapist to help client identify and process triggers for their depressive episode and anxiety with goal of reducing said SUDs caused by depression/anxiety by 33% in the next 6  months. Client to prioritize sleep 8+ hours each week night AEB going to bed by 10pm each night.  Barnie Del, LCSW, LCAS, CCTP, CCS-I, BSP

## 2020-03-24 ENCOUNTER — Encounter: Payer: Self-pay | Admitting: *Deleted

## 2020-03-26 ENCOUNTER — Ambulatory Visit (INDEPENDENT_AMBULATORY_CARE_PROVIDER_SITE_OTHER): Payer: 59 | Admitting: Addiction (Substance Use Disorder)

## 2020-03-26 DIAGNOSIS — F4323 Adjustment disorder with mixed anxiety and depressed mood: Secondary | ICD-10-CM

## 2020-03-27 ENCOUNTER — Encounter: Payer: Self-pay | Admitting: Physical Therapy

## 2020-03-27 ENCOUNTER — Ambulatory Visit: Payer: 59 | Attending: Surgery | Admitting: Physical Therapy

## 2020-03-27 ENCOUNTER — Other Ambulatory Visit: Payer: Self-pay

## 2020-03-27 ENCOUNTER — Encounter: Payer: Self-pay | Admitting: Addiction (Substance Use Disorder)

## 2020-03-27 DIAGNOSIS — Z483 Aftercare following surgery for neoplasm: Secondary | ICD-10-CM | POA: Insufficient documentation

## 2020-03-27 DIAGNOSIS — Z170 Estrogen receptor positive status [ER+]: Secondary | ICD-10-CM | POA: Insufficient documentation

## 2020-03-27 DIAGNOSIS — C50412 Malignant neoplasm of upper-outer quadrant of left female breast: Secondary | ICD-10-CM | POA: Insufficient documentation

## 2020-03-27 DIAGNOSIS — M25612 Stiffness of left shoulder, not elsewhere classified: Secondary | ICD-10-CM | POA: Diagnosis present

## 2020-03-27 NOTE — Patient Instructions (Signed)
Axillary web syndrome (also called cording) can happen after having breast cancer surgery when lymph nodes in the armpit are removed. It presents as if you have a thin cord in your arm and can run from the armpit all the way down into the forearm. If you've had a sentinel node biopsy, the risk is 1-20% and if you've had an axillary lymph node dissection (more than 7 nodes removed), the risk is 36-72%. The ranges vary depending on the research study.  It most often happens 3-4 weeks post-op but can happen sooner or later. There are several possibilities for what cording actually is. Although no one knows for sure as of yet, it may be related to lymphatics, veins, or other tissue. Sometimes cording resolves on its own but other times it requires physical therapy with a therapist who specializes in lymphedema and/or cancer rehab. Treatment typically involves stretching, manual techniques, and exercise. Sometimes cords get "released" while stretching or during manual treatment and the patient may experience the sensation of a "pop." This may feel strange but it is not dangerous and is a sign that the cord has released; range of motion may be improved in the process.  Closed Chain: Shoulder Abduction / Adduction - on Wall    One hand on wall, step to side and return. Stepping causes shoulder to abduct and adduct. Step ___ times, each side, ___ times per day.  http://ss.exer.us/267   Copyright  VHI. All rights reserved.   Closed Chain: Shoulder Flexion / Extension - on Wall    Hands on wall, step backward. Return. Stepping causes shoulder flexion and extension Do ___ times, each foot, ___ times per day.  http://ss.exer.us/265   Copyright  VHI. All rights reserved.              [facility]         [address], [place] [address]         [phone]         Annia Friendly, PT, CLT   After Breast Cancer Class It is recommended you attend the  ABC class to be educated on lymphedema risk reduction. This class is free of charge and lasts for 1 hour. It is a 1-time class.  You are scheduled for 04/07/2020 . We will send you a link.  Scar massage We will begin scar massage and teach you to do that when your incision is closed.   Compression garment Continue wearing your compression bra for a few weeks.   Home exercise Program Let's wait until Dr. Brantley Stage responds about your incision but once you're cleared, continue the exercises you were given and begin the 2 new ones on this paper.   Follow up PT: It is recommended you return every 3 months for the first 3 years following surgery to be assessed on the SOZO machine for an L-Dex score. This helps prevent clinically significant lymphedema in 95% of patients. These follow up screens are 15 minute appointments that you are not billed for. You are scheduled for April 11th at 8:00 here at [facility].

## 2020-03-27 NOTE — Progress Notes (Signed)
[facility] Counselor/Therapist Progress Note  Patient ID: Michaela Roman, MRN: [id number],    Date: 03/27/2020  Time Spent: 33mins  Treatment Type: Individual Therapy  Reported Symptoms: hopeful, some pain.  Mental Status Exam:  Appearance:   Casual     Behavior:  Appropriate and Sharing  Motor:  Normal  Speech/Language:   Clear and Coherent and Normal Rate  Affect:  Appropriate and Congruent  Mood:  sad but hopeful  Thought process:  normal  Thought content:    Rumination  Sensory/Perceptual disturbances:    WNL  Orientation:  x4  Attention:  Good  Concentration:  Good  Memory:  WNL  Fund of knowledge:   Good  Insight:    Good  Judgment:   Good  Impulse Control:  Good   Risk Assessment: Danger to Self:  No. Denied.  Self-injurious Behavior: No Danger to Others: No Duty to Warn:no Physical Aggression / Violence:No  Access to Firearms a concern: No  Gang Involvement:No   Virtual Visit via VIDEO: I connected with client by MyChart video enabled telemedicine/telehealth application, with their informed consent, and verified client privacy and that I am speaking with the correct person using two identifiers. I discussed the limitations, risks, security and privacy concerns of performing psychotherapy and management service virtually and confirmed their location. I also discussed with the patient that there may be a patient responsible charge related to this service and to confirm with the front desk if their insurance covers teletherapy. I also discussed with the patient the availability of in person appointments. The patient expressed understanding and agreed to proceed. I discussed the treatment planning with the client. The client was provided an opportunity to ask questions and all were answered. The client agreed with the plan and demonstrated an understanding of the instructions. The client was advised to call our office if symptoms worsen or feel they are in  a crisis state and need immediate contact. Client also reminded of a crisis line number and to use 9-1-1 if there's an emergency.  Therapist Location: home; Client Location: home.  Subjective: Client reported feeling sad about the changes shes had to endure to her body and how its affected her confidence. Client processed more of her pain from the surgery and how she is trying to cope with it and therapist used MI to support her in the pain and help suggest ways to deal with it. 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 Therapist assessed client's safety and client denied SI/HI/AVH.   Interventions: Motivational Interviewing and BSP  Diagnosis:   ICD-10-CM   1. Adjustment disorder with mixed anxiety and depressed mood  F43.23      Plan of Care: Client to return for weekly therapy with Sammuel Cooper, therapist, to review again in 6 months. Client is to continue seeing medication provider for support of mood management.   Client to engage in CBT: challenging negative internal ruminationsand self-talk AEB journaling daily or expressing thoughts to support persons in their life and then challenging it with truth.  Client to engage in tapping to tap in healthy cognition that challenges negative rumination or deep negative core belief.  Client to  practice DBT distress tolerance skills to decrease crying spells and thoughts of not being able to endure their suffering AEB using mindfulness, deep breathing, and TIP to increase tolerance for discomfort, discharge emotional distress, and increase their understanding that they can do hard things.  Client to utilize BSP  (brainspotting) with therapist to help client identify and process triggers for their depressive episode and anxiety with goal of reducing said SUDs caused by depression/anxiety by 33% in the next 6 months. Client to prioritize sleep 8+ hours each week night AEB going to bed by 10pm each night.  Barnie Del, LCSW, LCAS, CCTP, CCS-I, BSP

## 2020-03-27 NOTE — Therapy (Signed)
[place], [place], [address] Phone: [phone]   Fax:  [phone]  Physical Therapy Treatment  Patient Details  Name: Michaela Roman MRN: [id number] Date of Birth: Mar 03, 1970 Referring Provider (PT): Dr. Erroll Luna   Encounter Date: 03/27/2020   PT End of Session - 03/27/20 1200    Visit Number 2    Number of Visits 10    Date for PT Re-Evaluation 04/24/20    PT Start Time 1100    PT Stop Time 1200    PT Time Calculation (min) 60 min    Activity Tolerance Patient tolerated treatment well    Behavior During Therapy [facility] for tasks assessed/performed           Past Medical History:  Diagnosis Date  . Anxiety   . Arthritis    OP  . Breast cancer ([place])   . Depression   . Family history of breast cancer 02/13/2020  . Hormone disorder   . Hypothyroidism   . 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 Premature ovarian failure    Dx'd age 75  . Thyroid disease    hypothyroid    Past Surgical History:  Procedure Laterality Date  . BREAST LUMPECTOMY WITH RADIOACTIVE SEED AND SENTINEL LYMPH NODE BIOPSY Left 03/12/2020   Procedure: LEFT BREAST LUMPECTOMY WITH RADIOACTIVE SEED AND LEFT TARGETED RADIOACTIVE SEED LYMPH NODE BIOPSY AND LEFT SENTINEL LYMPH NODE MAPPING;  Surgeon: Erroll Luna, MD;  Location: [facility];  Service: General;  Laterality: Left;  . DILATATION & CURETTAGE/HYSTEROSCOPY WITH MYOSURE N/A 10/07/2015   Procedure: DILATATION & CURETTAGE/HYSTEROSCOPY WITH MYOSURE;  Surgeon: Nunzio Cobbs, MD;  Location: [facility];  Service: Gynecology;  Laterality: N/A;  endometrial polyp  . WISDOM TOOTH EXTRACTION      There were no vitals filed for this visit.   Subjective Assessment - 03/27/20 1105    Subjective Patient underwent a left lumpectomy and sentinel node biopsy (1/4 positive) on 03/12/2020. Her Mammaprint came back low risk. She will begin radiation in a few weeks.     Pertinent History Patient was diagnosed on 01/23/2020 with left 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 Patient underwent a left lumpectomy and sentinel node biopsy (1/4 positive) on 03/12/2020. It is ER/PR positive and HER2 negative with a Ki67 of 30%.    Patient Stated Goals SEe if my arm is doing ok    Currently in Pain? Yes    Pain Score 3     Pain Location Axilla    Pain Orientation Left    Pain Descriptors / Indicators Tightness    Pain Type Surgical pain    Pain Onset 1 to 4 weeks ago    Pain Frequency Intermittent    Aggravating Factors  Anything rubbing against skin    Pain Relieving Factors Rubbing axilla against skin              [facility] PT Assessment - 03/27/20 0001      Assessment   Medical Diagnosis s/p left lumpectomy and SLNB    Referring Provider (PT) Dr. [name] [place]    Onset Date/Surgical Date 03/12/20    Hand Dominance Right    Prior Therapy Baseline      Precautions   Precaution Comments recent surgery      Restrictions   Weight Bearing Restrictions No      Balance Screen   Has the patient fallen in the past 6 months No  Has the patient had a decrease in activity level because of a fear of falling?  No    Is the patient reluctant to leave their home because of a fear of falling?  No      Home Environment   Living Environment Private residence    Living Arrangements Spouse/significant other    Available Help at Discharge Family      Prior Function   Level of Independence Independent    Vocation Full time employment    Vocation Requirements She is back to working mostly normally    Leisure She is back to walking and biking some      Cognition   Overall Cognitive Status Within Functional Limits for tasks assessed      Observation/Other Assessments   Observations Breast incision on left is healing well. Axillary incision is slightly open on the medial end; photo taken.      Posture/Postural Control   Posture/Postural Control No  significant limitations      ROM / Strength   AROM / PROM / Strength AROM      AROM   AROM Assessment Site Shoulder    Right/Left Shoulder Left    Left Shoulder Extension 41 Degrees    Left Shoulder Flexion 136 Degrees    Left Shoulder ABduction 150 Degrees    Left Shoulder Internal Rotation 48 Degrees    Left Shoulder External Rotation 90 Degrees      Strength   Overall Strength Within functional limits for tasks performed                 LYMPHEDEMA/ONCOLOGY QUESTIONNAIRE - 03/27/20 0001      Type   Cancer Type Left breast cancer      Surgeries   Lumpectomy Date 03/12/20    Sentinel Lymph Node Biopsy Date 03/12/20    Number Lymph Nodes Removed 4      Treatment   Active Chemotherapy Treatment No    Past Chemotherapy Treatment No    Active Radiation Treatment No    Past Radiation Treatment No    Current Hormone Treatment No    Past Hormone Therapy No      What other symptoms do you have   Are you Having Heaviness or Tightness Yes    Are you having Pain Yes    Are you having pitting edema No    Is it Hard or Difficult finding clothes that fit No    Do you have infections No    Is there Decreased scar mobility Yes    Stemmer Sign No      Lymphedema Assessments   Lymphedema Assessments Upper extremities      Right Upper Extremity Lymphedema   10 cm Proximal to Olecranon Process 29.1 cm    Olecranon Process 23.4 cm    10 cm Proximal to Ulnar Styloid Process 21.3 cm    Just Proximal to Ulnar Styloid Process 13.8 cm    Across Hand at [employer] 18.1 cm    At [place] of 2nd Digit 5.9 cm      Left Upper Extremity Lymphedema   10 cm Proximal to Olecranon Process 29.1 cm    Olecranon Process 23 cm    10 cm Proximal to Ulnar Styloid Process 20.3 cm    Just Proximal to Ulnar Styloid Process 13.3 cm    Across Hand at [employer] 17.7 cm    At [place] of 2nd Digit 5.8 cm  [name] - 03/27/20 0001    Open a tight or new jar Mild  difficulty    Do heavy household chores (wash walls, wash floors) Unable    Carry a shopping bag or briefcase No difficulty    Wash your back No difficulty    Use a knife to cut food No difficulty    Recreational activities in which you take some force or impact through your arm, shoulder, or hand (golf, hammering, tennis) Moderate difficulty    During the past week, to what extent has your arm, shoulder or hand problem interfered with your normal social activities with family, friends, neighbors, or groups? Slightly    During the past week, to what extent has your arm, shoulder or hand problem limited your work or other regular daily activities Modererately    Arm, shoulder, or hand pain. Mild    Tingling (pins and needles) in your arm, shoulder, or hand Mild    Difficulty Sleeping Mild difficulty    DASH Score 29.55 %                          PT Education - 03/27/20 1158    Education Details Axillary web syndrome; HEP; aftercare; pt was instructed to hold off on HEP until we hear from MD about axillary incision healing.    Person(s) Educated Patient    Methods Explanation    Comprehension Verbalized understanding               PT Long Term Goals - 03/27/20 1205      PT LONG TERM GOAL #1   Title Patient will demonstrate she has regained full shoulder ROM and function post operatively compared to baselines.    Time 4    Period Weeks    Status On-going    Target Date 04/24/20      PT LONG TERM GOAL #2   Title Patient will increase left shoulder flexion to >/= 150 degrees for increased ease reaching.    Time 4    Period Weeks    Status New    Target Date 04/24/20      PT LONG TERM GOAL #3   Title Patient will increase left shoulder abduction to >/= 165 degrees for increased ease reaching.    Time 4    Period Weeks    Status New    Target Date 04/24/20      PT LONG TERM GOAL #4   Title Patient will improve her DASH score to be </= 10 for improved  overall uppre extremity function.    Baseline 29.55    Time 4    Period Weeks    Status New    Target Date 04/24/20      PT LONG TERM GOAL #5   Title Patient will report >/= 50% improvement in left axillary cording.    Time 4    Period Weeks    Status New    Target Date 04/24/20                 Plan - 03/27/20 1201    Clinical Impression Statement Patient is doing very well s/p left lumpectomy and sentinel node biopsy on 03/12/2020. She had 4 axillary nodes removed and 1 was positive. Her Mammaprint was low so no chemo is needed. She will begin radiation in a few weeks. Her shoulder ROM is somewhat limited with flexion and abduction and she has some mild axillary  cording. There is a concern with the medial aspect of her axillary incision which appears to have opened slightly. Contacted MD and sent photo of that. She plans to call her doctor as well as she was concerned. She will benefit from PT to address cording and AROM but will hold until incision is healed as it appears to open with shoulder abduction.    PT Frequency 2x / week    PT Duration 4 weeks    PT Treatment/Interventions ADLs/Self Care Home Management;Therapeutic exercise;Patient/family education;Manual techniques;Passive range of motion;Scar mobilization    PT Next Visit Plan Begin PROM left shoulder and manual techniques for cording; but on hold until MD responds about axillary incision healing.    PT Home Exercise Plan Post op shoulder ROM HEP    Consulted and Agree with Plan of Care Patient           Patient will benefit from skilled therapeutic intervention in order to improve the following deficits and impairments:  Postural dysfunction,Decreased range of motion,Decreased knowledge of precautions,Impaired UE functional use,Pain,Decreased scar mobility,Decreased skin integrity,Increased fascial restricitons  Visit Diagnosis: Malignant neoplasm of upper-outer quadrant of left breast in female, estrogen receptor  positive ([place]) - Plan: PT plan of care cert/re-cert  Aftercare following surgery for neoplasm - Plan: PT plan of care cert/re-cert  Stiffness of left shoulder, not elsewhere classified - Plan: PT plan of care cert/re-cert     Problem List Patient Active Problem List   Diagnosis Date Noted  . 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 Genetic testing 02/21/2020  . Family history of prostate cancer 02/13/2020  . Family history of breast cancer 02/13/2020  . Family history of colon cancer 02/13/2020  . Malignant neoplasm of upper-outer quadrant of left breast in female, estrogen receptor positive ([place]) 02/06/2020   Annia Friendly, PT 03/27/20 12:13 PM  [place], [place], [address] Phone: [phone]   Fax:  [phone]  Name: ESMERELDA FINNIGAN MRN: [id number] Date of Birth: 1970-09-12

## 2020-04-01 ENCOUNTER — Encounter: Payer: Self-pay | Admitting: Addiction (Substance Use Disorder)

## 2020-04-01 ENCOUNTER — Ambulatory Visit (INDEPENDENT_AMBULATORY_CARE_PROVIDER_SITE_OTHER): Payer: 59 | Admitting: Addiction (Substance Use Disorder)

## 2020-04-01 DIAGNOSIS — F4323 Adjustment disorder with mixed anxiety and depressed mood: Secondary | ICD-10-CM | POA: Diagnosis not present

## 2020-04-01 NOTE — Progress Notes (Signed)
[facility] Counselor/Therapist Progress Note  Patient ID: Michaela Roman, MRN: [id number],    Date: 04/01/2020  Time Spent: 67mins  Treatment Type: Individual Therapy  Reported Symptoms: feeling "different" & a little lonely  Mental Status Exam:  Appearance:   Casual     Behavior:  Appropriate and Sharing  Motor:  Normal  Speech/Language:   Clear and Coherent and Normal Rate  Affect:  Appropriate and Congruent  Mood:  anxious and sad   Thought process:  normal  Thought content:    Rumination  Sensory/Perceptual disturbances:    WNL  Orientation:  x4  Attention:  Good  Concentration:  Good  Memory:  WNL  Fund of knowledge:   Good  Insight:    Good  Judgment:   Good  Impulse Control:  Good   Risk Assessment: Danger to Self:  No. Denied.  Self-injurious Behavior: No Danger to Others: No Duty to Warn:no Physical Aggression / Violence:No  Access to Firearms a concern: No  Gang Involvement:No   Virtual Visit via VIDEO: I connected with client by MyChart video enabled telemedicine/telehealth application, with their informed consent, and verified client privacy and that I am speaking with the correct person using two identifiers. I discussed the limitations, risks, security and privacy concerns of performing psychotherapy and management service virtually and confirmed their location. I also discussed with the patient that there may be a patient responsible charge related to this service and to confirm with the front desk if their insurance covers teletherapy. I also discussed with the patient the availability of in person appointments. The patient expressed understanding and agreed to proceed. I discussed the treatment planning with the client. The client was provided an opportunity to ask questions and all were answered. The client agreed with the plan and demonstrated an understanding of the instructions. The client was advised to call our office if symptoms worsen or  feel they are in a crisis state and need immediate contact. Client also reminded of a crisis line number and to use 9-1-1 if there's an emergency.  Therapist Location: office; Client Location: home.  Subjective: Client reported feeling "different" as a female post breast cancer surgery. Client also feeling lonely since her mom left. Client's vulnerability causing her distress in her chest so therapist used MI & BSP with client to help her process the distress. Client able to reduce distress to a 1/10 and feel positive sensations in other body areas. Client making progress honoring her feelings and stating how she really feels to those around her, including asking what she needs. Therapist assessed client's safety and client denied SI/HI/AVH.   Interventions: Motivational Interviewing and BSP  Diagnosis:   ICD-10-CM   1. Adjustment disorder with mixed anxiety and depressed mood  F43.23      Plan of Care: Client to return for weekly therapy with Sammuel Cooper, therapist, to review again in 6 months. Client is to continue seeing medication provider for support of mood management.   Client to engage in CBT: challenging negative internal ruminationsand self-talk AEB journaling daily or expressing thoughts to support persons in their life and then challenging it with truth.  Client to engage in tapping to tap in healthy cognition that challenges negative rumination or deep negative core belief.  Client to practice DBT distress tolerance skills to decrease crying spells and thoughts of not being able to endure their suffering AEB using mindfulness, deep breathing, and TIP to increase tolerance for discomfort, discharge emotional distress,  and increase their understanding that they can do hard things.  Client to utilize BSP (brainspotting) with therapist to help client identify and process triggers for their depressive episode and anxiety with goal of reducing said SUDs caused by depression/anxiety by 33%  in the next 6 months. Client to prioritize sleep 8+ hours each week night AEB going to bed by 10pm each night.  Barnie Del, LCSW, LCAS, CCTP, CCS-I, BSP

## 2020-04-03 NOTE — Progress Notes (Signed)
Location of Breast Cancer  Malignant neoplasm of upper-outer quadrant of left breast in female, estrogen receptor positive ([place])          Histology per Pathology Report  03-12-2020    Component 3 wk ago  SURGICAL PATHOLOGY SURGICAL PATHOLOGY  CASE: [id number]  PATIENT: Michaela Roman  Surgical Pathology Report      Clinical History: Left breast cancer (ms)      FINAL MICROSCOPIC DIAGNOSIS:   A. 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  - Invasive carcinoma is 1-2 mm from the anterior-superior margin focally  (not on ink), and 1-2 mm from the medial margin focally (not on ink).  - Margins are negative for in situ carcinoma.  - See oncology table.   B. BREAST, LEFT ANTERIOR MARGIN, EXCISION:  - Benign breast tissue.   C. BREAST, LEFT INFERIOR MARGIN, EXCISION:  - Benign breast tissue.   D. LYMPH NODE, LEFT BREAST, EXCISION:  - Metastatic carcinoma in one of one lymph nodes (1/1).  - Deposit measures 12 mm.  - Biopsy site.   E. SENTINEL LYMPH NODE, LEFT AXILLARY, BIOPSY:  - One of one lymph nodes negative for carcinoma (0/1).   F. SENTINEL LYMPH NODE, LEFT AXILLARY, BIOPSY:  - One of one lymph nodes negative for carcinoma (0/1).   G. SENTINEL LYMPH NODE, LEFT AXILLARY, BIOPSY:  - One of one lymph nodes negative for carcinoma (0/1).    ONCOLOGY TABLE:    INVASIVE CARCINOMA OF THE BREAST: Resection   Procedure: Left breast lumpectomy with additional margins and sentinel  lymph node biopsies  Specimen Laterality: Left  Histologic Type: Invasive ductal carcinoma  Histologic Grade:    Glandular (Acinar)/Tubular Differentiation: 2    Nuclear Pleomorphism: 2    Mitotic Rate: 2    Overall Grade: 2  Tumor Size: 1.2 cm (glass slide)  Ductal Carcinoma In Situ: Present, intermediate grade with necrosis.  Tumor Extent: Limited to breast  parenchyma  Margins: All margins negative for invasive carcinoma    Distance from Closest Margin (mm): 1-2 mm from anterior-superior  margin focally (not on ink), 1-2 mm from lateral margin focally (not on  ink).    Specify Closest Margin (required only if <65m): See above.  DCIS Margins: All margins negative for DCIS   Distance from Closest Margin (mm): 3 mm    Specify Closest Margin (required only if <152m: anterior-superior  Regional Lymph Nodes:    Number of Lymph Nodes Examined: 4    Number of Sentinel Nodes Examined: 4    Number of Lymph Nodes with Macrometastases (>2 mm): 1    Number of Lymph Nodes with Micrometastases: 0    Number of Lymph Nodes with Isolated Tumor Cells (=0.2 mm or =200  cells): 0    Size of Largest Metastatic Deposit (millimeters): 12 mm (4 blocks x  0.3 cm per block)    Extranodal Extension: Not identified  Distant Metastasis:    Distant Site(s) Involved: Not applicable  Breast Prognostic Profile (Pre-neoadjuvant case #: : [id number]   Estrogen Receptor: Positive, 80%, strong staining    Progesterone Receptor: Positive, 80%, strong staining    Her2: Negative    Ki-67: 30%  Pathologic Stage Classification (pTNM, AJCC 8th Edition): pT1c, pN1a  Representative Tumor Block: A4  Comment(s): None   ([id number])      GROSS DESCRIPTION:   A: Specimen type: Received fresh and placed in formalin  at 5:15 PM on  03/12/2020, and labeled radioactive seed guided left breast lumpectomy  Size: 5.6 cm medial to lateral by 5.1 cm anterior to posterior by 2.5 cm  superior to inferior  Orientation: The specimen is received inked as follows: Anterior- green,  inferior- blue, lateral- orange, medial- yellow, posterior- black,  superior- red.  Localized area:  's are present on the superior aspect of the specimen. A radioactive  seed is identified and removed per protocol  Cut surface: Yellow lobulated adipose tissue  with tan-white fibrous  tissue (approximately 20% fibrous). Multiple clusters of interest there  is a 2.0 x 1.8 x 1.1 cm tan-white, firm, ill-defined lesion identified.  Within the lesion a silver metallic ribbon shaped biopsy clip is  identified.  Margins: Margins are inked as previously stated. The lesion abuts the  anterior margin, measures 0.5 cm to the superior margin, 0.8 cm to the  inferior margin, 0.4 cm from the lateral margin, and 2.1 cm to the  posterior margin.  Prognostic indicators: Obtained from paraffin blocks if needed  Block summary:  7 blocks submitted  1 = medial margin, perpendicular  2-6 = localized area of interest  7 = lateral margin, perpendicular   B: The specimen is received fresh and placed in formalin at 5:30 PM on  03/12/2020, and labeled left breast anterior margin. The specimen  consists of a 2.0 x 2.0 x 0.6 cm piece of tan-yellow fibroadipose  tissue, and the new anterior margin has been previously inked green.  The specimen is serially sectioned to reveal yellow lobulated adipose  tissue with tan-white fibrous tissue (approximately 50% fibrous). The  fibrous tissue broadly extends to the new inked margin. The specimen is  entirely submitted in 3 cassettes.   C: The specimen is received fresh and placed in formalin at 5:30 PM on  03/12/2020, and labeled left breast inferior margin. The specimen  consists of a 1.9 x 1.5 x 0.6 cm piece of tan-yellow fibroadipose  tissue, the new inferior margin has been previously inked blue. The  specimen is serially sectioned to reveal yellow lobulated adipose tissue  with tan-pink fibrous tissue (approximately 30% fibrous). The fibrous  tissue focally extends to the new inked margin. The specimen is  entirely submitted in 2 cassettes.   D: Specimen is received fresh and consists of a 2.1 x 1.6 x 1.0 cm  tan-brown possible lymph node. 2 pins are present on the surface of the  specimen, and a radioactive  seed is identified and removed per protocol.  Specimen is serially sectioned and entirely submitted in 5 cassettes.   E: The specimen is received fresh and consists of a 1.0 x 0.7 x 0.5 cm  tan-pink possible lymph node. The specimen is bisected and entirely  submitted in 1 cassette.   F: Received fresh in the same container as part E is a 0.6 x 0.4 x 0.2  cm tan-pink possible lymph node. The specimen is entirely submitted in  1 cassette.   G: Received fresh in same container as part E is a 0.7 x 0.3 x 0.3 cm  tan-red possible lymph node. Specimen is entirely submitted in 1  cassette. Craig Staggers 03/13/2020)        Receptor Status: ER(80% Positive), PR (80 % Positive), Her2-neu(Neg  ), Ki-(30%)  Did patient present with symptoms (if so, please note symptoms) or was this found on screening mammography?:  Self exam  Past/Anticipated interventions by surgeon, if any: 03/12/2020 Dr. Shann Medal  LEFT BREAST LUMPECTOMY WITH RADIOACTIVE SEED AND LEFT TARGETED RADIOACTIVE SEED LYMPH NODE BIOPSY AND LEFT SENTINEL LYMPH NODE MAPPING  Past/Anticipated interventions by medical oncology, if any: Chemotherapy   Lymphedema issues, if any:  None  Pain issues, if any:   Some axillary pain SAFETY ISSUES:  Prior radiation?  No  Pacemaker/ICD?  No  Possible current pregnancy? No  Is the patient on methotrexate? No Vitals:   04/09/20 0904  BP: 107/64  Pulse: 64  Resp: 18  Temp: 98.1 F (36.7 C)  SpO2: 100%  Weight: 68.6 kg  Height: '5\' 3"'  (1.6 m)       Current Complaints / other details:     Sherrlyn Hock, RN 04/03/2020,11:22 AM

## 2020-04-07 ENCOUNTER — Ambulatory Visit: Payer: 59

## 2020-04-07 ENCOUNTER — Other Ambulatory Visit: Payer: Self-pay

## 2020-04-07 DIAGNOSIS — Z483 Aftercare following surgery for neoplasm: Secondary | ICD-10-CM

## 2020-04-07 DIAGNOSIS — M8580 Other specified disorders of bone density and structure, unspecified site: Secondary | ICD-10-CM | POA: Diagnosis not present

## 2020-04-07 DIAGNOSIS — Z800 Family history of malignant neoplasm of digestive organs: Secondary | ICD-10-CM | POA: Diagnosis not present

## 2020-04-07 DIAGNOSIS — M25612 Stiffness of left shoulder, not elsewhere classified: Secondary | ICD-10-CM

## 2020-04-07 DIAGNOSIS — C50412 Malignant neoplasm of upper-outer quadrant of left female breast: Secondary | ICD-10-CM | POA: Insufficient documentation

## 2020-04-07 DIAGNOSIS — Z803 Family history of malignant neoplasm of breast: Secondary | ICD-10-CM | POA: Diagnosis not present

## 2020-04-07 DIAGNOSIS — Z510 Encounter for antineoplastic radiation therapy: Secondary | ICD-10-CM | POA: Diagnosis present

## 2020-04-07 DIAGNOSIS — Z923 Personal history of irradiation: Secondary | ICD-10-CM | POA: Diagnosis not present

## 2020-04-07 DIAGNOSIS — C773 Secondary and unspecified malignant neoplasm of axilla and upper limb lymph nodes: Secondary | ICD-10-CM | POA: Diagnosis not present

## 2020-04-07 DIAGNOSIS — Z170 Estrogen receptor positive status [ER+]: Secondary | ICD-10-CM | POA: Insufficient documentation

## 2020-04-07 DIAGNOSIS — Z1509 Genetic susceptibility to other malignant neoplasm: Secondary | ICD-10-CM | POA: Diagnosis not present

## 2020-04-07 DIAGNOSIS — Z8042 Family history of malignant neoplasm of prostate: Secondary | ICD-10-CM | POA: Diagnosis not present

## 2020-04-07 NOTE — Therapy (Signed)
[place], [place], [address] Phone: [phone]   Fax:  [phone]  Physical Therapy Treatment  Patient Details  Name: Michaela Roman MRN: [id number] Date of Birth: 01-Sep-1970 Referring Provider (PT): Dr. Erroll Luna   Encounter Date: 04/07/2020   PT End of Session - 04/07/20 2010    Visit Number 3    Number of Visits 10    Date for PT Re-Evaluation 04/24/20    PT Start Time 1540    PT Stop Time 1600    PT Time Calculation (min) 63 min    Activity Tolerance Patient tolerated treatment well    Behavior During Therapy [facility] for tasks assessed/performed           Past Medical History:  Diagnosis Date  . Anxiety   . Arthritis    OP  . Breast cancer ([place])   . Depression   . Family history of breast cancer 02/13/2020  . Hormone disorder   . Hypothyroidism   . 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 Premature ovarian failure    Dx'd age 35  . Thyroid disease    hypothyroid    Past Surgical History:  Procedure Laterality Date  . BREAST LUMPECTOMY WITH RADIOACTIVE SEED AND SENTINEL LYMPH NODE BIOPSY Left 03/12/2020   Procedure: LEFT BREAST LUMPECTOMY WITH RADIOACTIVE SEED AND LEFT TARGETED RADIOACTIVE SEED LYMPH NODE BIOPSY AND LEFT SENTINEL LYMPH NODE MAPPING;  Surgeon: Erroll Luna, MD;  Location: [facility];  Service: General;  Laterality: Left;  . DILATATION & CURETTAGE/HYSTEROSCOPY WITH MYOSURE N/A 10/07/2015   Procedure: DILATATION & CURETTAGE/HYSTEROSCOPY WITH MYOSURE;  Surgeon: Nunzio Cobbs, MD;  Location: [facility];  Service: Gynecology;  Laterality: N/A;  endometrial polyp  . WISDOM TOOTH EXTRACTION      There were no vitals filed for this visit.   Subjective Assessment - 04/07/20 1457    Subjective Axillary region incision  is fully closed now, but still have tightness in axillary region.  Started Supine AA flexion and ER without any problems but hasn't tried wall slide.   Thinks she may have a little swelling at lateral trunk and breast.  Just noticed this today.  Has radiation simulation on Wed..    Pertinent History Patient was diagnosed on 01/23/2020 with left 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 Patient underwent a left lumpectomy and sentinel node biopsy (1/4 positive) on 03/12/2020. It is ER/PR positive and HER2 negative with a Ki67 of 30%.    Currently in Pain? No/denies    Pain Score 0-No pain                             [facility] Adult PT Treatment/Exercise - 04/07/20 0001      Shoulder Exercises: Supine   Other Supine Exercises supine flex, scaption, horizontal abd x 5    Other Supine Exercises AA shoulder flexion and stargazer x 5      Manual Therapy   Manual Therapy Myofascial release;Passive ROM;Neural Stretch    Myofascial Release to area of cording at left axillary region    Passive ROM Left shoulder flexion, abd, ER, IR, D2 flexion   Neural Stretch wrist ext with shoulder at approx 120 deg abd                  PT Education - 04/07/20 2007    Education Details pt educated about possible small stitch abcess and  will discuss with MD on Wednesday.  Small stitch visible and palpable inside small whitehead howver does not appear infected in any way.    Person(s) Educated Patient    Methods Explanation    Comprehension Verbalized understanding               PT Long Term Goals - 03/27/20 1205      PT LONG TERM GOAL #1   Title Patient will demonstrate she has regained full shoulder ROM and function post operatively compared to baselines.    Time 4    Period Weeks    Status On-going    Target Date 04/24/20      PT LONG TERM GOAL #2   Title Patient will increase left shoulder flexion to >/= 150 degrees for increased ease reaching.    Time 4    Period Weeks    Status New    Target Date 04/24/20      PT LONG TERM GOAL #3   Title Patient will increase left shoulder abduction to >/= 165 degrees for  increased ease reaching.    Time 4    Period Weeks    Status New    Target Date 04/24/20      PT LONG TERM GOAL #4   Title Patient will improve her DASH score to be </= 10 for improved overall uppre extremity function.    Baseline 29.55    Time 4    Period Weeks    Status New    Target Date 04/24/20      PT LONG TERM GOAL #5   Title Patient will report >/= 50% improvement in left axillary cording.    Time 4    Period Weeks    Status New    Target Date 04/24/20                 Plan - 04/07/20 2011    Clinical Impression Statement Pts left axillary area assessed for healing.  Area healed except small area that appears like a white head with visible and palpable stitch that is potential stitch abcess.  It does not appear infected and she will ask MD about it on Wednesday.  She had good tolerance for MFR techniques but was mildly sore in axillary area over area of cording.  PROM improving nicely and pt felt good after rx.  there was no visible or palpable swelling today.    Stability/Clinical Decision Making Stable/Uncomplicated    Rehab Potential Excellent    PT Frequency 2x / week    PT Duration 4 weeks    PT Treatment/Interventions ADLs/Self Care Home Management;Therapeutic exercise;Patient/family education;Manual techniques;Passive range of motion;Scar mobilization;Manual lymph drainage    PT Next Visit Plan cont MFR techniques to axillary region for mild cording, PROM, add half roll to supine exs, MLD if needed for swelling.  Add abd on wall when pt is ready    PT Home Exercise Plan Post op shoulder ROM HEP    Consulted and Agree with Plan of Care Patient           Patient will benefit from skilled therapeutic intervention in order to improve the following deficits and impairments:  Postural dysfunction,Decreased range of motion,Decreased knowledge of precautions,Impaired UE functional use,Pain,Decreased scar mobility,Decreased skin integrity,Increased fascial  restricitons  Visit Diagnosis: Malignant neoplasm of upper-outer quadrant of left breast in female, estrogen receptor positive ([place])  Aftercare following surgery for neoplasm  Stiffness of left shoulder, not elsewhere classified  Problem List Patient Active Problem List   Diagnosis Date Noted  . 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 Genetic testing 02/21/2020  . Family history of prostate cancer 02/13/2020  . Family history of breast cancer 02/13/2020  . Family history of colon cancer 02/13/2020  . Malignant neoplasm of upper-outer quadrant of left breast in female, estrogen receptor positive ([place]) 02/06/2020    Claris Pong 04/07/2020, 8:18 PM  [place], [place], [address] Phone: [phone]   Fax:  [phone]  Name: AYEN VIVIANO MRN: [id number] Date of Birth: Oct 16, 1970  Cheral Almas, PT 04/07/20 8:20 PM

## 2020-04-08 ENCOUNTER — Ambulatory Visit (INDEPENDENT_AMBULATORY_CARE_PROVIDER_SITE_OTHER): Payer: 59 | Admitting: Addiction (Substance Use Disorder)

## 2020-04-08 DIAGNOSIS — F4323 Adjustment disorder with mixed anxiety and depressed mood: Secondary | ICD-10-CM

## 2020-04-08 NOTE — Progress Notes (Signed)
      [facility] Counselor/Therapist Progress Note  Patient ID: Michaela Roman, MRN: [id number],    Date: 04/08/2020  Time Spent: 78mins  Treatment Type: Individual Therapy  Reported Symptoms: feeling guilty  Mental Status Exam:  Appearance:   Casual     Behavior:  Appropriate and Sharing  Motor:  Normal  Speech/Language:   Clear and Coherent and Normal Rate  Affect:  Appropriate and Congruent  Mood:  anxious and sad   Thought process:  normal  Thought content:    Rumination  Sensory/Perceptual disturbances:    WNL  Orientation:  x4  Attention:  Good  Concentration:  Good  Memory:  WNL  Fund of knowledge:   Good  Insight:    Good  Judgment:   Good  Impulse Control:  Good   Risk Assessment: Danger to Self:  No. Denied.  Self-injurious Behavior: No Danger to Others: No Duty to Warn:no Physical Aggression / Violence:No  Access to Firearms a concern: No  Gang Involvement:No   Subjective: Client reported feeling guilty when she rests and is not doing heavy lifiting post surgery.  Client reported: "I often crave stillness, but I also run away from it." Client reported really feeling guilty when shes still but yet has to be slower and stiller since having her breast cancer surgery. Therapist used Mi & CBT with client to support her in processing and challenge irrational thoughts. Therapist assessed client's safety and client denied SI/HI/AVH.   Interventions: Cognitive Behavioral Therapy and Motivational Interviewing  Diagnosis:   ICD-10-CM   1. Adjustment disorder with mixed anxiety and depressed mood  F43.23      Plan of Care: Client to return for weekly therapy with Sammuel Cooper, therapist, to review again in 6 months. Client is to continue seeing medication provider for support of mood management.   Client to engage in CBT: challenging negative internal ruminationsand self-talk AEB journaling daily or expressing thoughts to support persons in their life and  then challenging it with truth.  Client to engage in tapping to tap in healthy cognition that challenges negative rumination or deep negative core belief.  Client to practice DBT distress tolerance skills to decrease crying spells and thoughts of not being able to endure their suffering AEB using mindfulness, deep breathing, and TIP to increase tolerance for discomfort, discharge emotional distress, and increase their understanding that they can do hard things.  Client to utilize BSP (brainspotting) with therapist to help client identify and process triggers for their depressive episode and anxiety with goal of reducing said SUDs caused by depression/anxiety by 33% in the next 6 months. Client to prioritize sleep 8+ hours each week night AEB going to bed by 10pm each night.  Barnie Del, LCSW, LCAS, CCTP, CCS-I, BSP

## 2020-04-09 ENCOUNTER — Other Ambulatory Visit: Payer: Self-pay

## 2020-04-09 ENCOUNTER — Ambulatory Visit: Payer: 59

## 2020-04-09 ENCOUNTER — Encounter: Payer: Self-pay | Admitting: Radiation Oncology

## 2020-04-09 ENCOUNTER — Ambulatory Visit
Admission: RE | Admit: 2020-04-09 | Discharge: 2020-04-09 | Disposition: A | Discharge status: Home / Self Care | Payer: 59 | Source: Ambulatory Visit | Attending: Radiation Oncology | Admitting: Radiation Oncology

## 2020-04-09 VITALS — BP 107/64 | HR 64 | Temp 98.1°F | Resp 18 | Ht 63.0 in | Wt 151.2 lb

## 2020-04-09 DIAGNOSIS — Z170 Estrogen receptor positive status [ER+]: Secondary | ICD-10-CM

## 2020-04-09 DIAGNOSIS — C50412 Malignant neoplasm of upper-outer quadrant of left female breast: Secondary | ICD-10-CM | POA: Insufficient documentation

## 2020-04-09 DIAGNOSIS — Z79899 Other long term (current) drug therapy: Secondary | ICD-10-CM | POA: Diagnosis not present

## 2020-04-09 DIAGNOSIS — Z510 Encounter for antineoplastic radiation therapy: Secondary | ICD-10-CM | POA: Insufficient documentation

## 2020-04-09 DIAGNOSIS — C773 Secondary and unspecified malignant neoplasm of axilla and upper limb lymph nodes: Secondary | ICD-10-CM | POA: Diagnosis not present

## 2020-04-09 DIAGNOSIS — Z483 Aftercare following surgery for neoplasm: Secondary | ICD-10-CM

## 2020-04-09 DIAGNOSIS — M25612 Stiffness of left shoulder, not elsewhere classified: Secondary | ICD-10-CM

## 2020-04-09 NOTE — Progress Notes (Signed)
Radiation Oncology         [phone]) [phone] ________________________________  Name: Michaela Roman MRN: [id number]  Date: 04/09/2020  DOB: [birth year]  Re-Evaluation Note  CC: Glenis Smoker, MD  Magrinat, Virgie Dad, MD    ICD-10-CM   1. Malignant neoplasm of upper-outer quadrant of left breast in female, estrogen receptor positive ([place])  C50.412    Z17.0     Diagnosis: Stage pT1c, pN1a, Left Breast UOQ, Invasive Ductal Carcinoma, ER+ / PR+ / Her2-, Grade 2  Narrative:  The patient returns today to discuss radiation treatment options. She was seen in the multidisciplinary breast clinic on 02/13/2020, at which time it was recommended that she proceed with genetic testing, MammaPrint on biopsy specimen if possible, left lumpectomy with TAD, adjuvant radiation therapy, and aromatase inhibitor.  Since consultation, she underwent genetic testing on 02/13/2020. Results were negative. MammaPrint results showed low risk.  She underwent a left breast lumpectomy with left axillary sentinel lymph node mapping and left axillary targeted lymph node biopsy on 03/12/2020 under the care of Dr. Brantley Stage. Pathology from the procedure revealed grade 2 invasive ductal carcinoma that spanned 1.2 cm. 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 The invasive carcinoma was 1-2 mm from the anterior-superior margin focally and 1-2 mm from the medial margin focally. Margins were negative for in situ carcinoma. Metastatic carcinoma was found in 1/4 lymph noes.  On review of systems, the patient reports some left axillary pain. She denies lymphedema and any other symptoms.  She reports having some cording in the axilla and is seeing physical therapy for this issue   Allergies:  is allergic to sertraline.  Meds: Current Outpatient Medications  Medication Sig Dispense Refill  . Ascorbic Acid (VITAMIN C) 1000 MG tablet Take 1,000 mg by mouth daily.    . calcium gluconate 500 MG  tablet Take 1,000 tablets by mouth daily.    Marland Kitchen escitalopram (LEXAPRO) 5 MG tablet Take 1 tablet by mouth daily. 90 tablet 0  . levothyroxine (SYNTHROID) 112 MCG tablet TAKE 1 TABLET BY MOUTH ONCE DAILY BEFORE BREAKFAST (Patient taking differently: Take 112 mcg by mouth daily before breakfast. TAKE 1 TABLET BY MOUTH ONCE DAILY BEFORE BREAKFAST) 90 tablet 3  . Multiple Vitamins-Minerals (MULTIVITAMIN PO) Take 1 tablet by mouth daily.    Marland Kitchen zinc gluconate 50 MG tablet Take 50 mg by mouth daily.    Marland Kitchen ibuprofen (ADVIL) 800 MG tablet Take 1 tablet (800 mg total) by mouth every 8 (eight) hours as needed. (Patient not taking: Reported on 04/09/2020) 30 tablet 0  . LORazepam (ATIVAN) 0.5 MG tablet Take 1 tablet (0.5 mg total) by mouth at bedtime. (Patient not taking: Reported on 04/09/2020) 30 tablet 0  . oxyCODONE (OXY IR/ROXICODONE) 5 MG immediate release tablet Take 1 tablet (5 mg total) by mouth every 6 (six) hours as needed for severe pain. (Patient not taking: Reported on 04/09/2020) 15 tablet 0   No current facility-administered medications for this encounter.    Physical Findings: The patient is in no acute distress. Patient is alert and oriented.  height is '5\' 3"'  (1.6 m) and weight is 151 lb 3.2 oz (68.6 kg). Her temperature is 98.1 F (36.7 C). Her blood pressure is 107/64 and her pulse is 64. Her respiration is 18 and oxygen saturation is 100%.   Lungs are clear to auscultation bilaterally. Heart has regular rate and rhythm. No palpable cervical, supraclavicular, or axillary adenopathy. Abdomen soft, non-tender, normal bowel  sounds. Right breast: no palpable mass, nipple discharge or bleeding. Left breast: Well-healing scar in the periareolar area, upper outer quadrant location.  Patient also has a second scar in the axillary area from her sentinel node procedure which is healing well slight erythema to the lateral aspect of the scar but no obvious signs of infection.  Lab Findings: Lab Results   Component Value Date   WBC 5.3 03/07/2020   HGB 14.2 03/07/2020   HCT 43.3 03/07/2020   MCV 93.7 03/07/2020   PLT 183 03/07/2020    Radiographic Findings: NM Sentinel Node Inj-No Rpt (Breast)  Result Date: 03/12/2020 Sulfur colloid was injected by the nuclear medicine technologist for melanoma sentinel node.   MM Breast Surgical Specimen  Result Date: 03/12/2020 CLINICAL DATA:  Evaluate surgical specimens of LEFT breast and LEFT axilla following LEFT lumpectomy and LEFT axillary node excision. EXAM: SPECIMEN RADIOGRAPH OF THE LEFT BREAST SPECIMEN RADIOGRAPH OF THE LEFT AXILLA COMPARISON:  Previous exam(s). FINDINGS: Status post excision of the LEFT breast and LEFT axilla. The radioactive seed and RIBBON biopsy marker clip are present, completely intact and marked for pathology. The radioactive seed and HydroMARK biopsy marker clip are present are present, completely intact and marked for pathology. IMPRESSION: Specimen radiographs of the LEFT breast and LEFT axilla. Electronically Signed   By: Margarette Canada M.D.   On: 03/12/2020 16:56   MM Breast Surgical Specimen  Result Date: 03/12/2020 CLINICAL DATA:  Evaluate surgical specimens of LEFT breast and LEFT axilla following LEFT lumpectomy and LEFT axillary node excision. EXAM: SPECIMEN RADIOGRAPH OF THE LEFT BREAST SPECIMEN RADIOGRAPH OF THE LEFT AXILLA COMPARISON:  Previous exam(s). FINDINGS: Status post excision of the LEFT breast and LEFT axilla. The radioactive seed and RIBBON biopsy marker clip are present, completely intact and marked for pathology. The radioactive seed and HydroMARK biopsy marker clip are present are present, completely intact and marked for pathology. IMPRESSION: Specimen radiographs of the LEFT breast and LEFT axilla. Electronically Signed   By: Margarette Canada M.D.   On: 03/12/2020 16:56   [place] LT RADIOACTIVE SEED LOC  Result Date: 03/12/2020 CLINICAL DATA:  Patient presents for radioactive seed localization a left breast  carcinoma and metastatic left axillary lymph node prior to surgical excision. EXAM: ULTRASOUND GUIDED RADIOACTIVE SEED LOCALIZATION OF THE LEFT BREAST AND LEFT AXILLA. TWO SEEDS PLACED. COMPARISON:  Previous exam(s). FINDINGS: Patient presents for radioactive seed localization prior to surgical excision. I met with the patient and we discussed the procedure of seed localization including benefits and alternatives. We discussed the high likelihood of a successful procedure. We discussed the risks of the procedure including infection, bleeding, tissue injury and further surgery. We discussed the low dose of radioactivity involved in the procedure. Informed, written consent was given. The usual time-out protocol was performed immediately prior to the procedure. Breast Mass. Using ultrasound guidance, sterile technique, 1% lidocaine and an I-125 radioactive seed, the 2 o'clock position, 1 cm from the nipple, carcinoma was localized using an inferior approach. The follow-up mammogram images confirm the seed in the expected location and were marked for Dr. Brantley Stage she. Follow-up survey of the patient confirms presence of the radioactive seed. Order number of I-125 seed:  [id number]. Total activity:  7.622 millicuries reference Date: 03/04/2020 Lymph Node. Using ultrasound guidance, sterile technique, 1% lidocaine and an I-125 radioactive seed, the metastatic left axillary lymph node was localized using an inferior approach. The follow-up mammogram images confirm the seed in the expected location and were marked  for Dr. Brantley Stage. Follow-up survey of the patient confirms presence of the radioactive seed. Order number of I-125 seed:  [id number]. Total activity:  8.242 millicuries reference Date: 03/04/2020 The patient tolerated the procedure well and was released from the [place]. She was given instructions regarding seed removal. IMPRESSION: Radioactive seed localization a carcinoma of the left breast and a metastatic  left axillary lymph node. No apparent complications. Electronically Signed   By: Lajean Manes M.D.   On: 03/12/2020 11:03   [place] LT RADIOACTIVE SEED LOC  Result Date: 03/12/2020 CLINICAL DATA:  Patient presents for radioactive seed localization a left breast carcinoma and metastatic left axillary lymph node prior to surgical excision. EXAM: ULTRASOUND GUIDED RADIOACTIVE SEED LOCALIZATION OF THE LEFT BREAST AND LEFT AXILLA. TWO SEEDS PLACED. COMPARISON:  Previous exam(s). FINDINGS: Patient presents for radioactive seed localization prior to surgical excision. I met with the patient and we discussed the procedure of seed localization including benefits and alternatives. We discussed the high likelihood of a successful procedure. We discussed the risks of the procedure including infection, bleeding, tissue injury and further surgery. We discussed the low dose of radioactivity involved in the procedure. Informed, written consent was given. The usual time-out protocol was performed immediately prior to the procedure. Breast Mass. Using ultrasound guidance, sterile technique, 1% lidocaine and an I-125 radioactive seed, the 2 o'clock position, 1 cm from the nipple, carcinoma was localized using an inferior approach. The follow-up mammogram images confirm the seed in the expected location and were marked for Dr. Brantley Stage she. Follow-up survey of the patient confirms presence of the radioactive seed. Order number of I-125 seed:  [id number]. Total activity:  5.400 millicuries reference Date: 03/04/2020 Lymph Node. Using ultrasound guidance, sterile technique, 1% lidocaine and an I-125 radioactive seed, the metastatic left axillary lymph node was localized using an inferior approach. The follow-up mammogram images confirm the seed in the expected location and were marked for Dr. Brantley Stage. Follow-up survey of the patient confirms presence of the radioactive seed. Order number of I-125 seed:  [id number]. Total activity:  3.267  millicuries reference Date: 03/04/2020 The patient tolerated the procedure well and was released from the [place]. She was given instructions regarding seed removal. IMPRESSION: Radioactive seed localization a carcinoma of the left breast and a metastatic left axillary lymph node. No apparent complications. Electronically Signed   By: Lajean Manes M.D.   On: 03/12/2020 11:03   MM CLIP PLACEMENT LEFT  Result Date: 03/12/2020 CLINICAL DATA:  Patient presents for radioactive seed localization of a left breast carcinoma and a metastatic left axillary lymph node. EXAM: DIAGNOSTIC LEFT MAMMOGRAM POST ULTRASOUND-GUIDED RADIOACTIVE SEED PLACEMENT: 2 SEEDS PLACED, 1 IN THE LEFT BREAST CARCINOMA AND THE OTHER IN THE METASTATIC LEFT AXILLARY LYMPH NODE. COMPARISON:  Previous exam(s). FINDINGS: Mammographic images were obtained following ultrasound-guided radioactive seed placement. These demonstrate both radioactive seeds well positioned adjacent to the post biopsy marker clips. In the anterior upper outer left breast the seed lies directly adjacent to the ribbon clip localizing the left breast carcinoma. In the left axilla, the seed lies adjacent to the [facility] clip localizing metastatic left axillary lymph node. IMPRESSION: Appropriate location of both radioactive seeds in the left breast and left axilla. Final Assessment: Post Procedure Mammograms for Seed Placement Electronically Signed   By: Lajean Manes M.D.   On: 03/12/2020 11:08    Impression: Stage pT1c, pN1a, Left Breast UOQ, Invasive Ductal Carcinoma, ER+ / PR+ / Her2-, Grade 2  Patient would  be an excellent candidate for breast conservation with radiation therapy directed at the left breast.  Given the findings of node positivity would also recommend coverage of the axilla/supraclavicular region at the same time as her breast radiation treatments.  As above MammaPrint showed low risk for distant recurrence and therefore adjuvant chemotherapy is  not recommended despite her being node positive.  I discussed the general course of treatment side effects and potential toxicities of radiation therapy in this situation with the patient.  She appears to understand and wishes to proceed with planned course of treatment.  Plan:  Patient is scheduled for CT simulation later today.  Treatments to begin approximately a week.  Anticipate 6 and half weeks of radiation therapy with the first 28 treatments covering the whole breast as well as the axillary region.  Will use cardiac sparing techniques if necessary.  Total time spent in this encounter was 35 minutes which included reviewing the patient's most recent genetic testing, mammaprint, lumpectomy, pathology report, physical examination, and documentation.  -----------------------------------  Blair Promise, PhD, MD  This document serves as a record of services personally performed by Gery Pray, MD. It was created on his behalf by Clerance Lav, a trained medical scribe. The creation of this record is based on the scribe's personal observations and the provider's statements to them. This document has been checked and approved by the attending provider.

## 2020-04-09 NOTE — Patient Instructions (Signed)

## 2020-04-09 NOTE — Therapy (Signed)
[place], [place], [address] Phone: [phone]   Fax:  [phone]  Physical Therapy Treatment  Patient Details  Name: Michaela Roman MRN: [id number] Date of Birth: 08/14/1970 Referring Provider (PT): Dr. Erroll Luna   Encounter Date: 04/09/2020   PT End of Session - 04/09/20 1544    Visit Number 4    Number of Visits 10    Date for PT Re-Evaluation 04/24/20    PT Start Time 1500    PT Stop Time 1550    PT Time Calculation (min) 50 min    Activity Tolerance Patient tolerated treatment well    Behavior During Therapy [facility] for tasks assessed/performed           Past Medical History:  Diagnosis Date  . Anxiety   . Arthritis    OP  . Breast cancer ([place])   . Depression   . Family history of breast cancer 02/13/2020  . Hormone disorder   . Hypothyroidism   . 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 Premature ovarian failure    Dx'd age 56  . Thyroid disease    hypothyroid    Past Surgical History:  Procedure Laterality Date  . BREAST LUMPECTOMY WITH RADIOACTIVE SEED AND SENTINEL LYMPH NODE BIOPSY Left 03/12/2020   Procedure: LEFT BREAST LUMPECTOMY WITH RADIOACTIVE SEED AND LEFT TARGETED RADIOACTIVE SEED LYMPH NODE BIOPSY AND LEFT SENTINEL LYMPH NODE MAPPING;  Surgeon: Erroll Luna, MD;  Location: [facility];  Service: General;  Laterality: Left;  . DILATATION & CURETTAGE/HYSTEROSCOPY WITH MYOSURE N/A 10/07/2015   Procedure: DILATATION & CURETTAGE/HYSTEROSCOPY WITH MYOSURE;  Surgeon: Nunzio Cobbs, MD;  Location: [facility];  Service: Gynecology;  Laterality: N/A;  endometrial polyp  . WISDOM TOOTH EXTRACTION      There were no vitals filed for this visit.   Subjective Assessment - 04/09/20 1455    Subjective Had radiation simulation today and it went well. He saw the stitch but wasn't worried about it.  He said to call Dr. Brantley Stage and see if he wanted me to put any antibiotic on  it. Not feeling any swelling today, Will start radiation next Thursday.   Pertinent History Patient was diagnosed on 01/23/2020 with left 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 Patient underwent a left lumpectomy and sentinel node biopsy (1/4 positive) on 03/12/2020. It is ER/PR positive and HER2 negative with a Ki67 of 30%.    Patient Stated Goals SEe if my arm is doing ok    Currently in Pain? No/denies    Pain Score 0-No pain    Pain Onset 1 to 4 weeks ago                             [facility] Adult PT Treatment/Exercise - 04/09/20 0001      Shoulder Exercises: Supine   Horizontal ABduction Strengthening;Both;10 reps    Theraband Level (Shoulder Horizontal ABduction) Level 1 (Yellow)    External Rotation Both;10 reps    Theraband Level (Shoulder External Rotation) Level 1 (Yellow)    Flexion Strengthening;Both;10 reps    Theraband Level (Shoulder Flexion) Level 1 (Yellow)    Diagonals Right;Left;10 reps    Theraband Level (Shoulder Diagonals) Level 1 (Yellow)    Other Supine Exercises supine flex, scaption, horizontal abd x 5 on half foam roll      Shoulder Exercises: Standing   Other Standing Exercises wall slide abd  x 5      Manual Therapy   Manual Therapy Myofascial release;Passive ROM;Neural Stretch    Myofascial Release to area of cording at left axillary region    Passive ROM Left shoulder flexion, abd, ER, IR    Neural Stretch wrist ext with ahoulder at approx 120 deg abd                  PT Education - 04/09/20 1559    Education Details Pt was educated in supine scapular series with yellow theraband 2 x 5 reps each.  Able to return demonstrate with good form    Person(s) Educated Patient    Methods Explanation;Demonstration;Handout    Comprehension Verbalized understanding;Returned demonstration               PT Long Term Goals - 03/27/20 1205      PT LONG TERM GOAL #1   Title Patient will demonstrate she has regained full  shoulder ROM and function post operatively compared to baselines.    Time 4    Period Weeks    Status On-going    Target Date 04/24/20      PT LONG TERM GOAL #2   Title Patient will increase left shoulder flexion to >/= 150 degrees for increased ease reaching.    Time 4    Period Weeks    Status New    Target Date 04/24/20      PT LONG TERM GOAL #3   Title Patient will increase left shoulder abduction to >/= 165 degrees for increased ease reaching.    Time 4    Period Weeks    Status New    Target Date 04/24/20      PT LONG TERM GOAL #4   Title Patient will improve her DASH score to be </= 10 for improved overall uppre extremity function.    Baseline 29.55    Time 4    Period Weeks    Status New    Target Date 04/24/20      PT LONG TERM GOAL #5   Title Patient will report >/= 50% improvement in left axillary cording.    Time 4    Period Weeks    Status New    Target Date 04/24/20                 Plan - 04/09/20 1545    Clinical Impression Statement Improved healing today at axillary region and no stitch present today.  White head is gone and replaced with small scab.  Excellent improvement in ROM and pt did very well with theraband exs which were added to HEP.    Stability/Clinical Decision Making Stable/Uncomplicated    Rehab Potential Excellent    PT Frequency 2x / week    PT Duration 4 weeks    PT Treatment/Interventions ADLs/Self Care Home Management;Therapeutic exercise;Patient/family education;Manual techniques;Passive range of motion;Scar mobilization;Manual lymph drainage    PT Next Visit Plan cont MFR techniques to axillary region for mild cording, PROM,, MLD if needed for swelling, ball rolls on wall, pulleys, review TB will send info for sleeve    PT Home Exercise Plan Post op shoulder ROM HEP, supine scapular exs with yellow tband    Consulted and Agree with Plan of Care Patient           Patient will benefit from skilled therapeutic intervention  in order to improve the following deficits and impairments:  Postural dysfunction,Decreased range of motion,Decreased knowledge of precautions,Impaired  UE functional use,Pain,Decreased scar mobility,Decreased skin integrity,Increased fascial restricitons  Visit Diagnosis: Malignant neoplasm of upper-outer quadrant of left breast in female, estrogen receptor positive [facility])  Aftercare following surgery for neoplasm  Stiffness of left shoulder, not elsewhere classified     Problem List Patient Active Problem List   Diagnosis Date Noted  . 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 Genetic testing 02/21/2020  . Family history of prostate cancer 02/13/2020  . Family history of breast cancer 02/13/2020  . Family history of colon cancer 02/13/2020  . Malignant neoplasm of upper-outer quadrant of left breast in female, estrogen receptor positive ([place]) 02/06/2020    Claris Pong 04/09/2020, 4:00 PM  [place], [place], [address] Phone: [phone]   Fax:  [phone]  Name: Michaela Roman MRN: [id number] Date of Birth: [birth year] Cheral Almas, PT 04/09/20 4:01 PM

## 2020-04-14 ENCOUNTER — Ambulatory Visit: Payer: 59

## 2020-04-14 ENCOUNTER — Other Ambulatory Visit: Payer: Self-pay

## 2020-04-14 DIAGNOSIS — Z483 Aftercare following surgery for neoplasm: Secondary | ICD-10-CM

## 2020-04-14 DIAGNOSIS — Z170 Estrogen receptor positive status [ER+]: Secondary | ICD-10-CM

## 2020-04-14 DIAGNOSIS — Z510 Encounter for antineoplastic radiation therapy: Secondary | ICD-10-CM | POA: Diagnosis not present

## 2020-04-14 DIAGNOSIS — M25612 Stiffness of left shoulder, not elsewhere classified: Secondary | ICD-10-CM

## 2020-04-14 DIAGNOSIS — C50412 Malignant neoplasm of upper-outer quadrant of left female breast: Secondary | ICD-10-CM

## 2020-04-14 NOTE — Therapy (Signed)
[place], [place], [address] Phone: [phone]   Fax:  [phone]  Physical Therapy Treatment  Patient Details  Name: Michaela Roman MRN: [id number] Date of Birth: June 27, 1970 Referring Provider (PT): Dr. Erroll Luna   Encounter Date: 04/14/2020   PT End of Session - 04/14/20 1745    Visit Number 5    Number of Visits 10    Date for PT Re-Evaluation 04/24/20    PT Start Time 1507    PT Stop Time 1600    PT Time Calculation (min) 53 min    Activity Tolerance Patient tolerated treatment well    Behavior During Therapy [facility] for tasks assessed/performed           Past Medical History:  Diagnosis Date  . Anxiety   . Arthritis    OP  . Breast cancer ([place])   . Depression   . Family history of breast cancer 02/13/2020  . Hormone disorder   . Hypothyroidism   . 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 Premature ovarian failure    Dx'd age 38  . Thyroid disease    hypothyroid    Past Surgical History:  Procedure Laterality Date  . BREAST LUMPECTOMY WITH RADIOACTIVE SEED AND SENTINEL LYMPH NODE BIOPSY Left 03/12/2020   Procedure: LEFT BREAST LUMPECTOMY WITH RADIOACTIVE SEED AND LEFT TARGETED RADIOACTIVE SEED LYMPH NODE BIOPSY AND LEFT SENTINEL LYMPH NODE MAPPING;  Surgeon: Erroll Luna, MD;  Location: [place];  Service: General;  Laterality: Left;  . DILATATION & CURETTAGE/HYSTEROSCOPY WITH MYOSURE N/A 10/07/2015   Procedure: DILATATION & CURETTAGE/HYSTEROSCOPY WITH MYOSURE;  Surgeon: Nunzio Cobbs, MD;  Location: [facility];  Service: Gynecology;  Laterality: N/A;  endometrial polyp  . WISDOM TOOTH EXTRACTION      There were no vitals filed for this visit.   Subjective Assessment - 04/14/20 1508    Subjective I woke up on Saturday and I noticed a big difference in my ROM.  Yesterday I bumped up the reps for the new exs and that felt good. No complaints of swelling at breast but  have a little firmness at incsion on breast.    Pertinent History Patient was diagnosed on 01/23/2020 with left 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 Patient underwent a left lumpectomy and sentinel node biopsy (1/4 positive) on 03/12/2020. It is ER/PR positive and HER2 negative with a Ki67 of 30%.    Patient Stated Goals SEe if my arm is doing ok    Currently in Pain? No/denies    Pain Score 0-No pain                             [facility] Adult PT Treatment/Exercise - 04/14/20 0001      Manual Therapy   Myofascial Release to area of cording at left axillary region    Passive ROM Left shoulder flexion, abd, ER, IR                  PT Education - 04/14/20 1552    Education Details Pt was educated in supine left breast MLD and practiced all steps after PT performed.  She was given illustrated and written instructions    Person(s) Educated Patient    Methods Explanation;Demonstration;Handout    Comprehension Returned demonstration;Need further instruction               PT Long Term Goals - 03/27/20  [place] #1   Title Patient will demonstrate she has regained full shoulder ROM and function post operatively compared to baselines.    Time 4    Period Weeks    Status On-going    Target Date 04/24/20      PT LONG TERM GOAL #2   Title Patient will increase left shoulder flexion to >/= 150 degrees for increased ease reaching.    Time 4    Period Weeks    Status New    Target Date 04/24/20      PT LONG TERM GOAL #3   Title Patient will increase left shoulder abduction to >/= 165 degrees for increased ease reaching.    Time 4    Period Weeks    Status New    Target Date 04/24/20      PT LONG TERM GOAL #4   Title Patient will improve her DASH score to be </= 10 for improved overall uppre extremity function.    Baseline 29.55    Time 4    Period Weeks    Status New    Target Date 04/24/20      PT LONG TERM GOAL #5    Title Patient will report >/= 50% improvement in left axillary cording.    Time 4    Period Weeks    Status New    Target Date 04/24/20                 Plan - 04/14/20 1746    Clinical Impression Statement Pt has been compliant with HEP and shoulder ROM has improved nicely with PROM now WNL with only mild pulling.  There is no palpable cording today.  She does have some mild breast fibrosis at lateral areola region of incision.  We discussed use of compression bra .  MLD was performed to left breast and pt was instructed in self MLD and did an excellent job requiring occasional VC's and TC's.  She will be measured for sleeve on Friday.    Stability/Clinical Decision Making Stable/Uncomplicated    Rehab Potential Excellent    PT Frequency 2x / week    PT Duration 4 weeks    PT Treatment/Interventions ADLs/Self Care Home Management;Therapeutic exercise;Patient/family education;Manual techniques;Passive range of motion;Scar mobilization;Manual lymph drainage    PT Next Visit Plan Review breast MLD, remeasure shoulder ROM, check goals, arm measured for sleeve 04/18/20    PT Home Exercise Plan Post op shoulder ROM HEP, supine scapular exs with yellow tband    Consulted and Agree with Plan of Care Patient           Patient will benefit from skilled therapeutic intervention in order to improve the following deficits and impairments:  Postural dysfunction,Decreased range of motion,Decreased knowledge of precautions,Impaired UE functional use,Pain,Decreased scar mobility,Decreased skin integrity,Increased fascial restricitons,Increased edema  Visit Diagnosis: Malignant neoplasm of upper-outer quadrant of left breast in female, estrogen receptor positive ([place])  Aftercare following surgery for neoplasm  Stiffness of left shoulder, not elsewhere classified     Problem List Patient Active Problem List   Diagnosis Date Noted  . 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 Genetic testing  02/21/2020  . Family history of prostate cancer 02/13/2020  . Family history of breast cancer 02/13/2020  . Family history of colon cancer 02/13/2020  . Malignant neoplasm of upper-outer quadrant of left breast in female, estrogen receptor positive ([place]) 02/06/2020    Elsie Ra  Hildegard Hlavac 04/14/2020, 5:54 PM  [place], [place], [address] Phone: [phone]   Fax:  [phone]  Name: JAELEIGH MONACO MRN: [id number] Date of Birth: May 19, 1970  Cheral Almas, PT 04/14/20 5:55 PM

## 2020-04-14 NOTE — Patient Instructions (Signed)
Manual Lymph Drainage for Left Breast.  Do daily.  Do slowly. Use flat hands with just enough pressure to stretch the skin. Do not slide over the skin, but move the skin with the hand you're using. Lie down or sit comfortably (in a recliner, for example) to do this.  1) Hug yourself:  cross arms and do circles at collar bones near neck 5-7 times (to "wake up" lots of lymph nodes in this area). 2) Take slow deep breaths, allowing your belly to balloon out as your breathe in, 5x (to "wake up" abdominal lymph nodes to take on extra fluid). 3) Right armpit-stretch skin in small circles to stimulate intact lymph nodes there, 5-7x. 4) Left groin area, at panty line-stretch skin in small circles to stimulate lymph nodes 5-7x. 5) Redirect fluid from left chest toward right armpit (stretch skin starting at left chest in 3-4 spots working toward right armpit) 3-4x across the chest. 6) Redirect fluid from left armpit toward left groin (cup your hand around the curve of your left side and do 3-4 "pumps" from armpit to groin) 3-4x down your side. 7) Draw an imaginary diagonal line from upper outer breast through the nipple area toward lower inner breast.  Direct fluid upward and inward from this line toward the pathway across your upper chest (established in #5).  Do this in three rows to treat all of the upper inner breast tissue, and do each row 3-4x. 8) Then repeat #5 above. 9) Direct fluid to treat all of lower outer breast tissue downward and outward toward pathway established in #6 that is aimed at the left groin. 10)  Then repeat #6 above. 11)  End with repeating #3 and #4 above.   [facility] [address] [place], [place]   [address] [phone] 

## 2020-04-15 ENCOUNTER — Encounter: Payer: Self-pay | Admitting: *Deleted

## 2020-04-15 ENCOUNTER — Ambulatory Visit (INDEPENDENT_AMBULATORY_CARE_PROVIDER_SITE_OTHER): Payer: 59 | Admitting: Psychiatry

## 2020-04-15 ENCOUNTER — Ambulatory Visit (INDEPENDENT_AMBULATORY_CARE_PROVIDER_SITE_OTHER): Payer: 59 | Admitting: Addiction (Substance Use Disorder)

## 2020-04-15 ENCOUNTER — Encounter: Payer: Self-pay | Admitting: Psychiatry

## 2020-04-15 DIAGNOSIS — F4323 Adjustment disorder with mixed anxiety and depressed mood: Secondary | ICD-10-CM

## 2020-04-15 DIAGNOSIS — F411 Generalized anxiety disorder: Secondary | ICD-10-CM

## 2020-04-15 MED ORDER — ESCITALOPRAM OXALATE 5 MG PO TABS: 7.5000 mg | ORAL_TABLET | Freq: Every day | ORAL | 0 refills | Status: DC

## 2020-04-15 MED ORDER — CLONAZEPAM 0.5 MG PO TABS: ORAL_TABLET | ORAL | 1 refills | Status: DC

## 2020-04-15 NOTE — Progress Notes (Signed)
Michaela Roman [id number] 1971/01/14 50 y.o.  Subjective:   Patient ID:  Michaela Roman is a 50 y.o. (DOB [birth year]) female.  Chief Complaint:  Chief Complaint  Patient presents with  . Sleeping Problem  . Follow-up    H/o anxiety and depression    HPI Michaela Roman presents to the office today for follow-up of anxiety and depression. Was dx'd with breast cancer the first week of December. Had a normal mammogram in June and noticed something on self breast exam. Reports that breast cancer had gone into lymph nodes. Breast cancer was Stage I and had surgery a few weeks ago. Will be starting radiation on Thursday. She reports that she has been seeing therapist regularly and has good support system.   She reports that for the last 2 months sleep has been "horrible." Awakening around 2-3 am every night and has difficulty returning to sleep, often 60-90 minutes before being able to return to sleep. Falls asleep without difficulty. Goes to sleep around 10 pm. She reports that oncologist prescribed Ativan and this was not helpful. She reports that she is having anxiety/stress dreams that occasionally interfere with sleep. She reports that she will awaken feeling anxious.  Reports that she had anxiety throughout December leading up to her surgery. She reports that she does not notice anxious thoughts during the day. She had physical s/s with anxiety the first few weeks after dx and this has resolved. Denies depressed mood. She reports that she feels tired. Motivation has been good and applied to a workshop last week and put together a recent proposal. She reports adequate concentration. Denies SI.    Has reduced work amount.   Past Psychiatric Medication Trials: Wellbutrin- Insomnia, racing thoughts Stopped after 2 weeks.  Zoloft- Took for a few weeks. Had insomnia. Developed hives. Lexapro Lorazepam- Had a calming effect. Not effective with decreasing middle of the night  awakenings.  Melatonin- Vivid dreams  Review of Systems:  Review of Systems  Gastrointestinal: Negative.   Musculoskeletal: Negative for gait problem.  Neurological: Negative for tremors and headaches.  Psychiatric/Behavioral:       Please refer to HPI     Had to stop hormone replacement therapy immediately. Initially had hot flashes.   Medications: I have reviewed the patient's current medications.  Current Outpatient Medications  Medication Sig Dispense Refill  . Ascorbic Acid (VITAMIN C) 1000 MG tablet Take 1,000 mg by mouth daily.    . calcium gluconate 500 MG tablet Take 1,000 tablets by mouth daily.    . clonazePAM (KLONOPIN) 0.5 MG tablet Take 1/2-1 tablet po QHS prn insomnia 30 tablet 1  . escitalopram (LEXAPRO) 5 MG tablet Take 1 tablet by mouth daily. 90 tablet 0  . escitalopram (LEXAPRO) 5 MG tablet Take 1.5 tablets (7.5 mg total) by mouth daily. 135 tablet 0  . levothyroxine (SYNTHROID) 112 MCG tablet TAKE 1 TABLET BY MOUTH ONCE DAILY BEFORE BREAKFAST (Patient taking differently: Take 112 mcg by mouth daily before breakfast. TAKE 1 TABLET BY MOUTH ONCE DAILY BEFORE BREAKFAST) 90 tablet 3  . Multiple Vitamins-Minerals (MULTIVITAMIN PO) Take 1 tablet by mouth daily.    Marland Kitchen zinc gluconate 50 MG tablet Take 50 mg by mouth daily.    Marland Kitchen ibuprofen (ADVIL) 800 MG tablet Take 1 tablet (800 mg total) by mouth every 8 (eight) hours as needed. (Patient not taking: No sig reported) 30 tablet 0  . LORazepam (ATIVAN) 0.5 MG tablet Take 1 tablet (0.5 mg total) by  mouth at bedtime. (Patient not taking: No sig reported) 30 tablet 0  . oxyCODONE (OXY IR/ROXICODONE) 5 MG immediate release tablet Take 1 tablet (5 mg total) by mouth every 6 (six) hours as needed for severe pain. (Patient not taking: No sig reported) 15 tablet 0   No current facility-administered medications for this visit.    Medication Side Effects: None  Allergies:  Allergies  Allergen Reactions  . Sertraline Hives     Past Medical History:  Diagnosis Date  . Anxiety   . Arthritis    OP  . Breast cancer ([place])   . Depression   . Family history of breast cancer 02/13/2020  . Hormone disorder   . Hypothyroidism   . 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 Premature ovarian failure    Dx'd age 64  . Thyroid disease    hypothyroid    Family History  Problem Relation Age of Onset  . Thyroid disease Sister        hypothyroidism  . Hypertension Maternal Grandmother   . Heart disease Maternal Grandmother   . Depression Maternal Grandmother   . Diabetes Maternal Grandfather   . Other Maternal Grandfather        benign brain tumor  . Depression Mother   . Multiple sclerosis Father   . Dementia Paternal Grandmother   . Alcohol abuse Paternal Grandfather   . Breast cancer Other        MGF's niece; dx 37s  . Colon cancer Other        MGF's brother; dx 94s  . Prostate cancer Cousin 42       maternal cousin    Social History   Socioeconomic History  . Marital status: Married    Spouse name: Not on file  . Number of children: Not on file  . Years of education: Not on file  . Highest education level: Not on file  Occupational History  . Not on file  Tobacco Use  . Smoking status: Never Smoker  . Smokeless tobacco: Never Used  Vaping Use  . Vaping Use: Never used  Substance and Sexual Activity  . Alcohol use: Yes    Alcohol/week: 2.0 standard drinks    Types: 2 Glasses of wine per week    Comment: one drink a week  . Drug use: No  . Sexual activity: Yes    Partners: Female, Female    Birth control/protection: Post-menopausal, None  Other Topics Concern  . Not on file  Social History Narrative  . Not on file   Social Determinants of Health   Financial Resource Strain: Not on file  Food Insecurity: Not on file  Transportation Needs: Not on file  Physical Activity: Not on file  Stress: Not on file  Social Connections: Not on file  Intimate Partner Violence: Not on file     Past Medical History, Surgical history, Social history, and Family history were reviewed and updated as appropriate.   Please see review of systems for further details on the patient's review from today.   Objective:   Physical Exam:  LMP 08/07/2015 (Approximate)   Physical Exam Constitutional:      General: She is not in acute distress. Musculoskeletal:        General: No deformity.  Neurological:     Mental Status: She is alert and oriented to person, place, and time.     Coordination: Coordination normal.  Psychiatric:        Attention and Perception: Attention  and perception normal. She does not perceive auditory or visual hallucinations.        Mood and Affect: Mood normal. Mood is not anxious or depressed. Affect is not labile, blunt, angry or inappropriate.        Speech: Speech normal.        Behavior: Behavior normal.        Thought Content: Thought content normal. Thought content is not paranoid or delusional. Thought content does not include homicidal or suicidal ideation. Thought content does not include homicidal or suicidal plan.        Cognition and Memory: Cognition and memory normal.        Judgment: Judgment normal.     Comments: Insight intact     Lab Review:     Component Value Date/Time   NA 141 03/07/2020 1050   NA 140 10/17/2018 0922   K 4.4 03/07/2020 1050   CL 102 03/07/2020 1050   CO2 30 03/07/2020 1050   GLUCOSE 99 03/07/2020 1050   BUN 15 03/07/2020 1050   BUN 12 10/17/2018 0922   CREATININE 0.91 03/07/2020 1050   CREATININE 1.03 (H) 02/13/2020 0821   CREATININE 0.95 08/22/2015 1653   CALCIUM 9.3 03/07/2020 1050   PROT 6.8 03/07/2020 1050   PROT 6.5 10/17/2018 0922   ALBUMIN 4.2 03/07/2020 1050   ALBUMIN 4.5 10/17/2018 0922   AST 26 03/07/2020 1050   AST 20 02/13/2020 0821   ALT 24 03/07/2020 1050   ALT 21 02/13/2020 0821   ALKPHOS 41 03/07/2020 1050   BILITOT 0.6 03/07/2020 1050   BILITOT 0.8 02/13/2020 0821   GFRNONAA >60  03/07/2020 1050   GFRNONAA >60 02/13/2020 0821   GFRAA 81 10/17/2018 0922       Component Value Date/Time   WBC 5.3 03/07/2020 1050   RBC 4.62 03/07/2020 1050   HGB 14.2 03/07/2020 1050   HGB 14.3 02/13/2020 0821   HGB 13.5 10/17/2018 0922   HCT 43.3 03/07/2020 1050   HCT 40.9 10/17/2018 0922   PLT 183 03/07/2020 1050   PLT 231 02/13/2020 0821   PLT 188 10/17/2018 0922   MCV 93.7 03/07/2020 1050   MCV 93 10/17/2018 0922   MCH 30.7 03/07/2020 1050   MCHC 32.8 03/07/2020 1050   RDW 12.0 03/07/2020 1050   RDW 13.1 10/17/2018 0922   LYMPHSABS 1.6 03/07/2020 1050   MONOABS 0.4 03/07/2020 1050   EOSABS 0.1 03/07/2020 1050   BASOSABS 0.0 03/07/2020 1050    No results found for: POCLITH, LITHIUM   No results found for: PHENYTOIN, PHENOBARB, VALPROATE, CBMZ   .res Assessment: Plan:   Pt seen for 30 minutes and time spent counseling pt regarding possible treatment options to include potential benefits, risks, and side effects of Klonopin and increase in Lexapro.  Discussed changing Lorazepam to Klonopin since it has a longer duration and may be helpful for nocturnal panic attacks that are occurring later in the night/early morning.  Discussed that increase in Lexapro to 7.5 mg po qd may help decrease overall anxiety, particularly since she noticed a significant improvement in mood and anxiety with 5 mg. Pt agrees to increase in Lexapro.  Pt to follow-up in 6-8 weeks or sooner if clinically indicated.  Recommend continuing therapy with Sammuel Cooper, LCSW.  Patient advised to contact office with any questions, adverse effects, or acute worsening in signs and symptoms.  Copper was seen today for sleeping problem and follow-up.  Diagnoses and all orders for this visit:  Adjustment disorder with mixed anxiety and depressed mood -     clonazePAM (KLONOPIN) 0.5 MG tablet; Take 1/2-1 tablet po QHS prn insomnia  GAD (generalized anxiety disorder) -     escitalopram (LEXAPRO) 5 MG tablet;  Take 1.5 tablets (7.5 mg total) by mouth daily.     Please see After Visit Summary for patient specific instructions.  Future Appointments  Date Time Provider [place]  04/16/2020  3:00 PM Claris Pong, PT [facility] None  04/17/2020 12:35 PM Gery Pray, MD [facility] None  04/18/2020 12:30 PM [facility] LINAC 3 [facility] None  04/21/2020  1:45 PM [facility] LINAC 3 [facility] None  04/21/2020  3:00 PM Walcott, Elsie Ra, PT [facility] None  04/22/2020  9:00 AM Sammuel Cooper J, LCSW [facility] None  04/22/2020  1:15 PM [facility] LINAC 3 [facility] None  04/23/2020  1:45 PM [facility] LINAC 3 [facility] None  04/23/2020  3:00 PM Walcott, Elsie Ra, PT [facility] None  04/24/2020 10:00 AM Magrinat, Virgie Dad, MD [facility] None  04/24/2020  1:45 PM [facility] LINAC 3 [facility] None  04/25/2020  1:45 PM [facility] LINAC 3 [facility] None  04/28/2020  1:45 PM [facility] LINAC 3 [facility] None  04/29/2020  9:00 AM Cain, Lonn Georgia J, LCSW [facility] None  04/29/2020  1:45 PM [facility] LINAC 3 [facility] None  04/30/2020  1:45 PM [facility] LINAC 3 [facility] None  05/01/2020  1:45 PM [facility] LINAC 3 [facility] None  05/02/2020  1:45 PM [facility] LINAC 3 [facility] None  05/05/2020  1:45 PM [facility] LINAC 3 [facility] None  05/06/2020  9:00 AM Cain, Lonn Georgia J, LCSW [facility] None  05/06/2020  1:45 PM [facility] LINAC 3 [facility] None  05/07/2020  1:45 PM [facility] LINAC 3 [facility] None  05/08/2020  1:45 PM [facility] LINAC 3 [facility] None  05/09/2020  1:45 PM [facility] LINAC 3 [facility] None  05/12/2020  9:00 AM Cain, Lonn Georgia J, LCSW [facility] None  05/12/2020  1:45 PM [facility] LINAC 3 [facility] None  05/13/2020  1:45 PM [facility] LINAC 3 [facility] None  05/14/2020  1:45 PM [facility] LINAC 3 [facility] None  05/15/2020  1:45 PM [facility] LINAC 3 [facility] None  05/16/2020  1:45 PM [facility] LINAC 3 [facility] None  05/19/2020  1:45 PM [facility] LINAC 3 [facility] None   05/20/2020 10:00 AM Sammuel Cooper J, LCSW [facility] None  05/20/2020  1:45 PM [facility] LINAC 3 [facility] None  05/20/2020  2:00 PM Gery Pray, MD [facility] None  05/21/2020  1:45 PM [facility] LINAC 3 [facility] None  05/22/2020  1:45 PM [facility] LINAC 3 [facility] None  05/23/2020  1:45 PM [facility] LINAC 3 [facility] None  05/26/2020  1:45 PM [facility] LINAC 3 [facility] None  05/27/2020  9:00 AM Sammuel Cooper J, LCSW [facility] None  05/27/2020  1:45 PM [facility] LINAC 3 [facility] None  05/28/2020  1:45 PM [facility] LINAC 3 [facility] None  05/29/2020 10:00 AM Thayer Headings, PMHNP [facility] None  05/29/2020  1:45 PM [facility] LINAC 3 [facility] None  05/30/2020  1:45 PM [facility] LINAC 3 [facility] None  06/02/2020  1:45 PM [facility] LINAC 3 [facility] None  06/03/2020  1:45 PM [facility] LINAC 3 [facility] None  06/09/2020  8:00 AM Suanne Marker, PTA [facility] None  10/23/2020  3:30 PM Amundson Raliegh Ip, MD [facility] None    No orders of the defined types were placed in this encounter.   -------------------------------

## 2020-04-15 NOTE — Progress Notes (Signed)
      [facility] Counselor/Therapist Progress Note  Patient ID: Michaela Roman, MRN: [id number],    Date: 04/15/2020  Time Spent: 66mins  Treatment Type: Individual Therapy  Reported Symptoms: grieving, and wanting to run away from the grief/pain  Mental Status Exam:  Appearance:   Casual     Behavior:  Appropriate and Sharing  Motor:  Normal  Speech/Language:   Clear and Coherent and Normal Rate  Affect:  Appropriate and Congruent  Mood:  sad   Thought process:  normal  Thought content:    Rumination  Sensory/Perceptual disturbances:    WNL  Orientation:  x4  Attention:  Good  Concentration:  Good  Memory:  WNL  Fund of knowledge:   Good  Insight:    Good  Judgment:   Good  Impulse Control:  Good   Risk Assessment: Danger to Self:  No. Denied.  Self-injurious Behavior: No Danger to Others: No Duty to Warn:no Physical Aggression / Violence:No  Access to Firearms a concern: No  Gang Involvement:No   Subjective: Client reported feeling internal distress & anxiety nearing beginning her radiation therapy. Client processed feeling grief about each stage of cancer txt and feeling "silly" for complaining. Therapist used MI to normalize her struggle and support her in processing each grief stage for different parts of her txt. Therapist also used double spotting BSP with client to help her process distress in her gut of wanting to run away from her pain/grief and wanting to stay present and really live, that she felt in her sacrum. Client made progress processing it and reduced SUDs 5 points to a 2/10. Therapist assessed client's safety and client denied SI/HI/AVH.   Interventions: Motivational Interviewing and BSP  Diagnosis:   ICD-10-CM   1. Adjustment disorder with mixed anxiety and depressed mood  F43.23      Plan of Care: Client to return for weekly therapy with Sammuel Cooper, therapist, to review again in 6 months. Client is to continue seeing medication  provider for support of mood management.   Client to engage in CBT: challenging negative internal ruminationsand self-talk AEB journaling daily or expressing thoughts to support persons in their life and then challenging it with truth.  Client to engage in tapping to tap in healthy cognition that challenges negative rumination or deep negative core belief.  Client to practice DBT distress tolerance skills to decrease crying spells and thoughts of not being able to endure their suffering AEB using mindfulness, deep breathing, and TIP to increase tolerance for discomfort, discharge emotional distress, and increase their understanding that they can do hard things.  Client to utilize BSP (brainspotting) with therapist to help client identify and process triggers for their depressive episode and anxiety with goal of reducing said SUDs caused by depression/anxiety by 33% in the next 6 months. Client to prioritize sleep 8+ hours each week night AEB going to bed by 10pm each night.  Barnie Del, LCSW, LCAS, CCTP, CCS-I, BSP

## 2020-04-16 ENCOUNTER — Ambulatory Visit: Payer: 59

## 2020-04-16 ENCOUNTER — Other Ambulatory Visit: Payer: Self-pay

## 2020-04-16 DIAGNOSIS — M25612 Stiffness of left shoulder, not elsewhere classified: Secondary | ICD-10-CM

## 2020-04-16 DIAGNOSIS — Z483 Aftercare following surgery for neoplasm: Secondary | ICD-10-CM

## 2020-04-16 DIAGNOSIS — C50412 Malignant neoplasm of upper-outer quadrant of left female breast: Secondary | ICD-10-CM

## 2020-04-16 DIAGNOSIS — Z510 Encounter for antineoplastic radiation therapy: Secondary | ICD-10-CM | POA: Diagnosis not present

## 2020-04-16 NOTE — Therapy (Signed)
[place], [place], [address] Phone: [phone]   Fax:  [phone]  Physical Therapy Treatment  Patient Details  Name: Michaela Roman MRN: [id number] Date of Birth: 1970-08-15 Referring Provider (PT): Dr. Erroll Luna   Encounter Date: 04/16/2020   PT End of Session - 04/16/20 1608    Visit Number 6    Number of Visits 10    Date for PT Re-Evaluation 04/24/20    PT Start Time 1505    PT Stop Time 1601    PT Time Calculation (min) 56 min    Activity Tolerance Patient tolerated treatment well    Behavior During Therapy [facility] for tasks assessed/performed           Past Medical History:  Diagnosis Date  . Anxiety   . Arthritis    OP  . Breast cancer ([place])   . Depression   . Family history of breast cancer 02/13/2020  . Hormone disorder   . Hypothyroidism   . 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 Premature ovarian failure    Dx'd age 86  . Thyroid disease    hypothyroid    Past Surgical History:  Procedure Laterality Date  . BREAST LUMPECTOMY WITH RADIOACTIVE SEED AND SENTINEL LYMPH NODE BIOPSY Left 03/12/2020   Procedure: LEFT BREAST LUMPECTOMY WITH RADIOACTIVE SEED AND LEFT TARGETED RADIOACTIVE SEED LYMPH NODE BIOPSY AND LEFT SENTINEL LYMPH NODE MAPPING;  Surgeon: Erroll Luna, MD;  Location: [facility];  Service: General;  Laterality: Left;  . DILATATION & CURETTAGE/HYSTEROSCOPY WITH MYOSURE N/A 10/07/2015   Procedure: DILATATION & CURETTAGE/HYSTEROSCOPY WITH MYOSURE;  Surgeon: Nunzio Cobbs, MD;  Location: [facility];  Service: Gynecology;  Laterality: N/A;  endometrial polyp  . WISDOM TOOTH EXTRACTION      There were no vitals filed for this visit.   Subjective Assessment - 04/16/20 1505    Subjective Did my exercises with the band before coming today.  Did some of the lymphatic massage shortly after I woke up and it felt more swollen.  Shoulder ROM is feeling good     Pertinent History Patient was diagnosed on 01/23/2020 with left 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 Patient underwent a left lumpectomy and sentinel node biopsy (1/4 positive) on 03/12/2020. It is ER/PR positive and HER2 negative with a Ki67 of 30%.    Patient Stated Goals SEe if my arm is doing ok    Currently in Pain? No/denies    Pain Score 0-No pain    Pain Onset 1 to 4 weeks ago              [facility] PT Assessment - 04/16/20 0001      AROM   Left Shoulder Extension 52 Degrees    Left Shoulder Flexion 160 Degrees    Left Shoulder ABduction 166 Degrees    Left Shoulder Internal Rotation 52 Degrees    Left Shoulder External Rotation 95 Degrees                         [facility] Adult PT Treatment/Exercise - 04/16/20 0001      Shoulder Exercises: Standing   External Rotation Strengthening;Both;10 reps    Theraband Level (Shoulder External Rotation) Level 1 (Yellow)    Extension Both;Strengthening;10 reps    Theraband Level (Shoulder Extension) Level 1 (Yellow)    Retraction Strengthening;Both;10 reps    Theraband Level (Shoulder Retraction) Level 1 (Yellow)  Manual Therapy   Myofascial Release to area of cording at left axillary region    Manual Lymphatic Drainage (MLD) reviewed with pt,supraclavicular, left axillary and left inguinal LN, Left axillo-inguinal pahway, and left lateral breast retracing all steps and ending with LN's    Passive ROM Left shoulder flexion, abd, ER, IR, D2 flex                  PT Education - 04/16/20 1602    Education Details Pt was educated in standing scapular retraction, shoulder ext. and bilateral ER all with yellow x 10 reps.  She was given illustrated and written instructions    Person(s) Educated Patient    Methods Explanation;Demonstration;Handout    Comprehension Verbalized understanding;Returned demonstration               PT Long Term Goals - 04/16/20 1614      PT LONG TERM GOAL #1   Title  Patient will demonstrate she has regained full shoulder ROM and function post operatively compared to baselines.    Time 4    Period Weeks    Status Achieved      PT LONG TERM GOAL #2   Title Patient will increase left shoulder flexion to >/= 150 degrees for increased ease reaching.    Time 4    Period Weeks    Status Achieved      PT LONG TERM GOAL #3   Title Patient will increase left shoulder abduction to >/= 165 degrees for increased ease reaching.    Time 4    Period Weeks    Status Achieved      PT LONG TERM GOAL #4   Title Patient will improve her DASH score to be </= 10 for improved overall uppre extremity function.    Baseline 29.55    Time 4    Period Weeks    Status On-going    Target Date 04/24/20      PT LONG TERM GOAL #5   Title Patient will report >/= 50% improvement in left axillary cording.    Time 4    Period Weeks    Status Achieved      Additional Long Term Goals   Additional Long Term Goals Yes      PT LONG TERM GOAL #6   Title Pt will be fit for class 1 sleeve/gauntlet and will demonstrate proper donning/doffing and review times to wear    Time 1    Period Weeks    Status New    Target Date 04/24/20      PT LONG TERM GOAL #7   Title Pt will return demonstrate proper Self MLD to left breast    Time 1    Period Weeks    Status New    Target Date 04/24/20                 Plan - 04/16/20 1609    Clinical Impression Statement Pts. ROM was assessed today and she achieved all ROM goals established.  We reviewed self Breast MLD and pt did an excellent job with good form and technique.  Very mild axillary cording is palpable but greatly improved.  She was given her prescription to get fit for compression sleeve and she will try and go to [facility] to be fit ASAP.  She may opt to purchase the sleeve and try it first before purchasing the gauntlet.  She is doing well with theraband exs and notes  it  feels good to exercise again     Stability/Clinical Decision Making Stable/Uncomplicated    Rehab Potential Excellent    PT Frequency 2x / week    PT Duration 4 weeks    PT Treatment/Interventions ADLs/Self Care Home Management;Therapeutic exercise;Patient/family education;Manual techniques;Passive range of motion;Scar mobilization;Manual lymph drainage    PT Next Visit Plan recert or DC, check sleeve for fit and discuss wearing, review MLD. May wait until she gets sleeve but will call    PT Home Exercise Plan Post op shoulder ROM HEP, supine scapular exs with yellow tband, standing shoulder ext, Scap retraction and Bilateral ER with yellow band, Self MLD    Consulted and Agree with Plan of Care Patient           Patient will benefit from skilled therapeutic intervention in order to improve the following deficits and impairments:  Postural dysfunction,Decreased range of motion,Decreased knowledge of precautions,Impaired UE functional use,Pain,Decreased scar mobility,Decreased skin integrity,Increased fascial restricitons,Increased edema  Visit Diagnosis: Malignant neoplasm of upper-outer quadrant of left breast in female, estrogen receptor positive ([place])  Aftercare following surgery for neoplasm  Stiffness of left shoulder, not elsewhere classified     Problem List Patient Active Problem List   Diagnosis Date Noted  . 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 Genetic testing 02/21/2020  . Family history of prostate cancer 02/13/2020  . Family history of breast cancer 02/13/2020  . Family history of colon cancer 02/13/2020  . Malignant neoplasm of upper-outer quadrant of left breast in female, estrogen receptor positive ([place]) 02/06/2020    Claris Pong 04/16/2020, 4:18 PM  [place], [place], [address] Phone: [phone]   Fax:  [phone]  Name: Michaela Roman MRN: [id number] Date of Birth: Oct 22, 1970  Cheral Almas,  PT 04/16/20 4:20 PM

## 2020-04-16 NOTE — Patient Instructions (Signed)
Access Code: [email or web address] URL: [email or web address] Date: 04/16/2020 Prepared by: Cheral Almas  Exercises Scapular Retraction with Resistance - 1 x daily - 7 x weekly - 10 reps Scapular Retraction with Resistance Advanced - 1 x daily - 7 x weekly - 1 sets - 10 reps Shoulder External Rotation and Scapular Retraction with Resistance - 1 x daily - 7 x weekly - 1 sets - 10 reps

## 2020-04-17 ENCOUNTER — Ambulatory Visit
Admission: RE | Admit: 2020-04-17 | Discharge: 2020-04-17 | Disposition: A | Discharge status: Home / Self Care | Payer: 59 | Source: Ambulatory Visit | Attending: Radiation Oncology | Admitting: Radiation Oncology

## 2020-04-17 ENCOUNTER — Other Ambulatory Visit: Payer: Self-pay

## 2020-04-17 DIAGNOSIS — Z170 Estrogen receptor positive status [ER+]: Secondary | ICD-10-CM

## 2020-04-17 DIAGNOSIS — Z510 Encounter for antineoplastic radiation therapy: Secondary | ICD-10-CM | POA: Diagnosis not present

## 2020-04-18 ENCOUNTER — Other Ambulatory Visit: Payer: Self-pay

## 2020-04-18 ENCOUNTER — Ambulatory Visit
Admission: RE | Admit: 2020-04-18 | Discharge: 2020-04-18 | Disposition: A | Discharge status: Home / Self Care | Payer: 59 | Source: Ambulatory Visit | Attending: Radiation Oncology | Admitting: Radiation Oncology

## 2020-04-18 DIAGNOSIS — Z510 Encounter for antineoplastic radiation therapy: Secondary | ICD-10-CM | POA: Diagnosis not present

## 2020-04-21 ENCOUNTER — Other Ambulatory Visit: Payer: Self-pay

## 2020-04-21 ENCOUNTER — Ambulatory Visit: Payer: 59

## 2020-04-21 ENCOUNTER — Ambulatory Visit
Admission: RE | Admit: 2020-04-21 | Discharge: 2020-04-21 | Disposition: A | Discharge status: Home / Self Care | Payer: 59 | Source: Ambulatory Visit | Attending: Radiation Oncology | Admitting: Radiation Oncology

## 2020-04-21 DIAGNOSIS — Z510 Encounter for antineoplastic radiation therapy: Secondary | ICD-10-CM | POA: Diagnosis not present

## 2020-04-22 ENCOUNTER — Ambulatory Visit: Payer: 59 | Admitting: Addiction (Substance Use Disorder)

## 2020-04-22 ENCOUNTER — Other Ambulatory Visit: Payer: Self-pay

## 2020-04-22 ENCOUNTER — Ambulatory Visit
Admission: RE | Admit: 2020-04-22 | Discharge: 2020-04-22 | Disposition: A | Discharge status: Home / Self Care | Payer: 59 | Source: Ambulatory Visit | Attending: Radiation Oncology | Admitting: Radiation Oncology

## 2020-04-22 DIAGNOSIS — C50412 Malignant neoplasm of upper-outer quadrant of left female breast: Secondary | ICD-10-CM

## 2020-04-22 DIAGNOSIS — Z510 Encounter for antineoplastic radiation therapy: Secondary | ICD-10-CM | POA: Diagnosis not present

## 2020-04-22 MED ORDER — ALRA NON-METALLIC DEODORANT (RAD-ONC)
1.0000 "application " | Freq: Once | TOPICAL | Status: AC
  Administered 2020-04-22: 1 via TOPICAL

## 2020-04-22 MED ORDER — RADIAPLEXRX EX GEL: Freq: Once | CUTANEOUS | Status: AC

## 2020-04-23 ENCOUNTER — Ambulatory Visit
Admission: RE | Admit: 2020-04-23 | Discharge: 2020-04-23 | Disposition: A | Discharge status: Home / Self Care | Payer: 59 | Source: Ambulatory Visit | Attending: Radiation Oncology | Admitting: Radiation Oncology

## 2020-04-23 ENCOUNTER — Ambulatory Visit: Payer: 59

## 2020-04-23 DIAGNOSIS — Z510 Encounter for antineoplastic radiation therapy: Secondary | ICD-10-CM | POA: Diagnosis not present

## 2020-04-23 NOTE — Progress Notes (Signed)
Michaela Roman  [phone] [phone]     ID: Michaela Roman DOB: Aug 27, 1970  MR#: [id number]  YHC#:[id number]  Patient Care Team: Glenis Smoker, MD as PCP - General (Family Medicine) Mauro Kaufmann, RN as Oncology Nurse Navigator Rockwell Germany, RN as Oncology Nurse Navigator Erroll Luna, MD as Consulting Physician (General Surgery) Gladis Soley, Virgie Dad, MD as Consulting Physician (Oncology) Gery Pray, MD as Consulting Physician (Radiation Oncology) Nunzio Cobbs, MD as Consulting Physician (Obstetrics and Gynecology) Chauncey Cruel, MD OTHER MD:  CHIEF COMPLAINT: Estrogen receptor positive breast cancer  CURRENT TREATMENT: adjuvant radiation   INTERVAL HISTORY: Michaela Roman returns today for follow up of her estrogen receptor positive breast cancer. She was evaluated in the multidisciplinary breast cancer clinic on 02/13/2020.  She underwent genetic testing during clinic.  Results are detailed below  Mammaprint was performed on the original core biopsy. Results showed low risk.   She proceeded with left lumpectomy on 03/12/2020 under Dr. Brantley Stage. 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  Out of the 4 biopsied lymph nodes, one came back showing metastatic carcinoma (1/4). This is the lymph node that was previously biopsied and showed metastatic carcinoma.  She was referred back to Dr. Sondra Come on 04/09/2020 to review radiation therapy. She subsequently began treatment on 04/17/2020 and is scheduled to finish on 06/03/2020.   REVIEW OF SYSTEMS: Michaela Roman did fine with her surgery, with no significant pain bleeding or fever.  She is receiving radiation and she finds it "weird" but lobs the fact that it is very brief.  She had many questions today including follow-up issues, which  antiestrogen to take, and Stats regarding recurrence.   COVID 19 VACCINATION STATUS: Status post Pfizer x2 with booster October 2021   HISTORY OF CURRENT ILLNESS: From the original intake note:  Michaela Roman herself palpated a left breast lump. Of note, annual screening mammogram from 08/27/2019 was negative. She underwent left diagnostic mammography with tomography and left breast ultrasonography at [facility] on 01/23/2020 showing: breast density category B; palpable 1.3 cm mass in left breast at 2 o'clock that extends to skin; one borderline abnormal left axillary lymph node.  Accordingly on 02/04/2020 she proceeded to biopsy of the left breast area in question. The pathology from this procedure ([id number]) showed: invasive ductal carcinoma, grade 2. Prognostic indicators significant for: estrogen receptor, 80% positive and progesterone receptor, 80% positive, both with moderate-strong staining intensity. Proliferation marker Ki67 at 30%. HER2 negative by immunohistochemistry (0).  The biopsied lymph node was positive for metastatic carcinoma.  The patient's subsequent history is as detailed below.   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   Premature ovarian failure    Dx'd age 34   Thyroid disease    hypothyroid    PAST SURGICAL HISTORY: Past Surgical History:  Procedure Laterality Date   BREAST LUMPECTOMY WITH RADIOACTIVE SEED AND SENTINEL LYMPH NODE BIOPSY Left 03/12/2020   Procedure: LEFT BREAST LUMPECTOMY WITH RADIOACTIVE SEED AND LEFT TARGETED RADIOACTIVE SEED LYMPH NODE BIOPSY AND LEFT SENTINEL LYMPH NODE MAPPING;  Surgeon: Erroll Luna, MD;  Location: [facility];  Service: General;  Laterality: Left;   DILATATION & CURETTAGE/HYSTEROSCOPY  WITH MYOSURE N/A 10/07/2015   Procedure: DILATATION & CURETTAGE/HYSTEROSCOPY WITH MYOSURE;  Surgeon: Nunzio Cobbs, MD;  Location: [facility];  Service: Gynecology;  Laterality: N/A;  endometrial polyp   WISDOM TOOTH EXTRACTION      FAMILY HISTORY: Family History  Problem Relation Age of Onset   Thyroid disease Sister        hypothyroidism   Hypertension Maternal Grandmother    Heart disease Maternal Grandmother    Depression Maternal Grandmother    Diabetes Maternal Grandfather    Other Maternal Grandfather        benign brain tumor   Depression Mother    Multiple sclerosis Father    Dementia Paternal Grandmother    Alcohol abuse Paternal Grandfather    Breast cancer Other        MGF's niece; dx 43s   Colon cancer Other        MGF's brother; dx 52s   Prostate cancer Cousin 68       maternal cousin   Her father is age 84 and her mother age 53, as of 01/2020. Michaela Roman has one brother and one sister. She reports breast cancer in her mother's paternal first cousin in her 69's. She also notes colon cancer in her mother's paternal uncle in his 64's.   GYNECOLOGIC HISTORY:  Patient's last menstrual period was 08/07/2015 (approximate). Menarche: 50 years old [place] P 0 LMP 2015 Contraceptive: used from age [age] HRT used for 11 years, stopped with cancer diagnosis  Hysterectomy? no BSO? 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 Husband Talmadge "Michaela Roman" Burt Ek is a Doctor, hospital, as well as the Chief of Staff [employer] here in [place]. She lives at home with Michaela Roman, no pets.  The patient is a Buddhist.    ADVANCED DIRECTIVES: in place   HEALTH MAINTENANCE: Social History   Tobacco Use   Smoking status: Never Smoker   Smokeless tobacco: Never Used  Vaping Use   Vaping Use: Never used  Substance Use Topics   Alcohol use: Yes    Alcohol/week: 2.0 standard  drinks    Types: 2 Glasses of wine per week    Comment: one drink a week   Drug use: No     Colonoscopy: n/a (age)  PAP: 09/2019  Bone density: 11/2018, -1.5   Allergies  Allergen Reactions   Sertraline Hives    Current Outpatient Medications  Medication Sig Dispense Refill   Ascorbic Acid (VITAMIN C) 1000 MG tablet Take 1,000 mg by mouth daily.     calcium gluconate 500 MG tablet Take 1,000 tablets by mouth daily.     clonazePAM (KLONOPIN) 0.5 MG tablet Take 1/2-1 tablet po QHS prn insomnia 30 tablet 1   escitalopram (LEXAPRO) 5 MG tablet Take 1 tablet by mouth daily. 90 tablet 0   escitalopram (LEXAPRO) 5 MG tablet Take 1.5 tablets (7.5 mg total) by mouth daily. 135 tablet 0   ibuprofen (ADVIL) 800 MG tablet Take 1 tablet (800 mg total) by mouth every 8 (eight) hours as needed. (Patient not taking: No sig reported) 30 tablet 0   levothyroxine (SYNTHROID) 112 MCG tablet TAKE 1 TABLET BY MOUTH ONCE DAILY BEFORE BREAKFAST (Patient taking differently: Take 112 mcg by mouth daily before breakfast. TAKE 1 TABLET BY MOUTH ONCE DAILY BEFORE BREAKFAST) 90 tablet 3  LORazepam (ATIVAN) 0.5 MG tablet Take 1 tablet (0.5 mg total) by mouth at bedtime. (Patient not taking: No sig reported) 30 tablet 0   Multiple Vitamins-Minerals (MULTIVITAMIN PO) Take 1 tablet by mouth daily.     oxyCODONE (OXY IR/ROXICODONE) 5 MG immediate release tablet Take 1 tablet (5 mg total) by mouth every 6 (six) hours as needed for severe pain. (Patient not taking: No sig reported) 15 tablet 0   zinc gluconate 50 MG tablet Take 50 mg by mouth daily.     No current facility-administered medications for this visit.    OBJECTIVE: White woman who appears well  Vitals:   04/24/20 0958  BP: 102/63  Pulse: (!) 58  Resp: 18  Temp: 97.7 F (36.5 C)  SpO2: 100%     Body mass index is 27.36 kg/m.   Wt Readings from Last 3 Encounters:  04/24/20 154 lb 7 oz (70.1 kg)  04/09/20 151 lb 3.2 oz (68.6 kg)   03/12/20 151 lb (68.5 kg)      ECOG FS:0 - Asymptomatic  Sclerae unicteric, EOMs intact Wearing a mask No cervical or supraclavicular adenopathy Lungs no rales or rhonchi Heart regular rate and rhythm Abd soft, nontender, positive bowel sounds MSK no focal spinal tenderness, no upper extremity lymphedema Neuro: nonfocal, well oriented, appropriate affect Breasts: The right breast is benign.  The left breast is status post lumpectomy and is currently receiving radiation.  The cosmetic result is excellent.  There is no evidence of residual or recurrent disease.  Both axillae are benign   LAB RESULTS:  CMP     Component Value Date/Time   NA 141 03/07/2020 1050   NA 140 10/17/2018 0922   K 4.4 03/07/2020 1050   CL 102 03/07/2020 1050   CO2 30 03/07/2020 1050   GLUCOSE 99 03/07/2020 1050   BUN 15 03/07/2020 1050   BUN 12 10/17/2018 0922   CREATININE 0.91 03/07/2020 1050   CREATININE 1.03 (H) 02/13/2020 0821   CREATININE 0.95 08/22/2015 1653   CALCIUM 9.3 03/07/2020 1050   PROT 6.8 03/07/2020 1050   PROT 6.5 10/17/2018 0922   ALBUMIN 4.2 03/07/2020 1050   ALBUMIN 4.5 10/17/2018 0922   AST 26 03/07/2020 1050   AST 20 02/13/2020 0821   ALT 24 03/07/2020 1050   ALT 21 02/13/2020 0821   ALKPHOS 41 03/07/2020 1050   BILITOT 0.6 03/07/2020 1050   BILITOT 0.8 02/13/2020 0821   GFRNONAA >60 03/07/2020 1050   GFRNONAA >60 02/13/2020 0821   GFRAA 81 10/17/2018 0922    No results found for: TOTALPROTELP, ALBUMINELP, A1GS, A2GS, BETS, BETA2SER, GAMS, MSPIKE, SPEI  Lab Results  Component Value Date   WBC 5.3 03/07/2020   NEUTROABS 3.2 03/07/2020   HGB 14.2 03/07/2020   HCT 43.3 03/07/2020   MCV 93.7 03/07/2020   PLT 183 03/07/2020    No results found for: LABCA2  No components found for: NLGXQJ194  No results for input(s): INR in the last 168 hours.  No results found for: LABCA2  No results found for: RDE081  No results found for: KGY185  No results found for:  UDJ497  No results found for: CA2729  No components found for: HGQUANT  No results found for: CEA1 / No results found for: CEA1   No results found for: AFPTUMOR  No results found for: CHROMOGRNA  No results found for: KPAFRELGTCHN, LAMBDASER, KAPLAMBRATIO (kappa/lambda light chains)  No results found for: HGBA, HGBA2QUANT, HGBFQUANT, HGBSQUAN (Hemoglobinopathy evaluation)  No results found for: LDH  No results found for: IRON, TIBC, IRONPCTSAT (Iron and TIBC)  No results found for: FERRITIN  Urinalysis    Component Value Date/Time   BILIRUBINUR n 08/22/2015 1436   PROTEINUR n 08/22/2015 1436   UROBILINOGEN negative 08/22/2015 1436   NITRITE n 08/22/2015 1436   LEUKOCYTESUR Negative 08/22/2015 1436    STUDIES: No results found.   ELIGIBLE FOR AVAILABLE RESEARCH PROTOCOL: AET  ASSESSMENT: 50 y.o. [place] woman status post left breast and left axillary lymph node biopsy 02/04/2020 for a clinical T1c N1, stage IB invasive ductal carcinoma, grade 2, estrogen and progesterone receptor positive, HER-2 not amplified, with an MIB-1 of 30%   (1) status post left lumpectomy and targeted axillary lymph node dissection 03/12/2020 for a pT1c pNa, stage IB invasive ductal carcinoma, grade 2, with negative margins  (a) a total of 4 left axillary lymph nodes removed, 1+  (2) MammaPrint to be obtained from the left breast biopsy sample showed a low risk, luminal a type breast cancer, predicting a 96% 5-year metastasis free survival with hormone therapy alone and no significant chemotherapy benefit  (3) genetics testing 02/26/2020 showed a 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   (b) The CancerNext-Expanded gene panel offered by [employer] found no additional mutations in AIP, ALK, APC, ATM, AXIN2, BAP1, BARD1, BLM, BMPR1A,  BRCA1, BRCA2, BRIP1, CDC73, CDH1, CDK4, CDKN1B, CDKN2A, CHEK2, CTNNA1, DICER1, FANCC, FH, FLCN, GALNT12, KIF1B, LZTR1, MAX, MEN1, MET, MLH1, MSH2, MSH3, MSH6, MUTYH, NBN, NF1, NF2, NTHL1, PALB2, PHOX2B, PMS2, POT1, PRKAR1A, PTCH1, PTEN, RAD51C, RAD51D, RB1, RECQL, RET, SDHA, SDHAF2, SDHB, SDHC, SDHD, SMAD4, SMARCA4, SMARCB1, SMARCE1, STK11, SUFU, TMEM127, TP53, TSC1, TSC2, VHL and XRCC2 (sequencing and deletion/duplication); EGFR, EGLN1, HOXB13, KIT, MITF, PDGFRA, POLD1, and POLE (sequencing only); EPCAM and GREM1 (deletion/duplication only)  (4) adjuvant radiation to be completed 06/03/2020  (5) antiestrogens to start 06/29/2020  (a) most recent bone scan shows osteopenia   PLAN: Michaela Roman did very well with her surgery and is doing well with radiation now.  We reviewed her situation from the beginning.  She understands that she has a luminal a type tumor, that these tumors generally have a good prognosis, and that generally they do not either require or benefit from chemotherapy.  To get the excellent prognosis predicted by MammaPrint she will need to take an antiestrogen pill for 5 years.  We discussed how side effects are determined so that she can discriminate between association on causation.  I gave her information on tamoxifen and anastrozole and recommended that she consider tamoxifen in particular.  We also reviewed her genetics testing and she tells me her brother is aware and is discussing prostate screening with his physician  As she nears the end of radiation she will drop into my office and let me know which antiestrogen she would like to start.  The plan is to start that pill 06/29/2020.  I would then have a virtual visit with her mid June to assess tolerance.  If she tolerates it well then I would probably see her in September with a new mammogram and possibly a new bone density after which she would have routine follow-up  Total encounter time today 35 minutes.Sarajane Jews C.  Hermela Hardt, MD 04/24/2020 10:04 AM Medical Oncology and Hematology [facility] [place],  [place] [address] Tel. [phone]    Fax. [phone]   This document serves as a record of services personally performed by Lurline Del, MD. It was created on his behalf by Wilburn Mylar, a trained medical scribe. The creation of this record is based on the scribe's personal observations and the provider's statements to them.   I, Lurline Del MD, have reviewed the above documentation for accuracy and completeness, and I agree with the above.   *Total Encounter Time as defined by the Centers for Medicare and Medicaid Services includes, in addition to the face-to-face time of a patient visit (documented in the note above) non-face-to-face time: obtaining and reviewing outside history, ordering and reviewing medications, tests or procedures, care coordination (communications with other health care professionals or caregivers) and documentation in the medical record.

## 2020-04-24 ENCOUNTER — Ambulatory Visit
Admission: RE | Admit: 2020-04-24 | Discharge: 2020-04-24 | Disposition: A | Discharge status: Home / Self Care | Payer: 59 | Source: Ambulatory Visit | Attending: Radiation Oncology | Admitting: Radiation Oncology

## 2020-04-24 ENCOUNTER — Other Ambulatory Visit: Payer: Self-pay

## 2020-04-24 ENCOUNTER — Inpatient Hospital Stay: Payer: 59 | Attending: Oncology | Admitting: Oncology

## 2020-04-24 VITALS — BP 102/63 | HR 58 | Temp 97.7°F | Resp 18 | Ht 63.0 in | Wt 154.4 lb

## 2020-04-24 DIAGNOSIS — Z1379 Encounter for other screening for genetic and chromosomal anomalies: Secondary | ICD-10-CM

## 2020-04-24 DIAGNOSIS — C50412 Malignant neoplasm of upper-outer quadrant of left female breast: Secondary | ICD-10-CM | POA: Insufficient documentation

## 2020-04-24 DIAGNOSIS — Z170 Estrogen receptor positive status [ER+]: Secondary | ICD-10-CM | POA: Insufficient documentation

## 2020-04-24 DIAGNOSIS — Z923 Personal history of irradiation: Secondary | ICD-10-CM | POA: Insufficient documentation

## 2020-04-24 DIAGNOSIS — Z510 Encounter for antineoplastic radiation therapy: Secondary | ICD-10-CM | POA: Diagnosis not present

## 2020-04-24 DIAGNOSIS — Z8042 Family history of malignant neoplasm of prostate: Secondary | ICD-10-CM | POA: Insufficient documentation

## 2020-04-24 DIAGNOSIS — Z1509 Genetic susceptibility to other malignant neoplasm: Secondary | ICD-10-CM | POA: Insufficient documentation

## 2020-04-24 DIAGNOSIS — Z800 Family history of malignant neoplasm of digestive organs: Secondary | ICD-10-CM | POA: Insufficient documentation

## 2020-04-24 DIAGNOSIS — M8580 Other specified disorders of bone density and structure, unspecified site: Secondary | ICD-10-CM | POA: Insufficient documentation

## 2020-04-24 DIAGNOSIS — Z1503 Genetic susceptibility to malignant neoplasm of prostate: Secondary | ICD-10-CM

## 2020-04-24 DIAGNOSIS — C773 Secondary and unspecified malignant neoplasm of axilla and upper limb lymph nodes: Secondary | ICD-10-CM | POA: Insufficient documentation

## 2020-04-24 DIAGNOSIS — Z803 Family history of malignant neoplasm of breast: Secondary | ICD-10-CM | POA: Insufficient documentation

## 2020-04-25 ENCOUNTER — Ambulatory Visit
Admission: RE | Admit: 2020-04-25 | Discharge: 2020-04-25 | Disposition: A | Discharge status: Home / Self Care | Payer: 59 | Source: Ambulatory Visit | Attending: Radiation Oncology | Admitting: Radiation Oncology

## 2020-04-25 ENCOUNTER — Telehealth: Payer: Self-pay | Admitting: Oncology

## 2020-04-25 DIAGNOSIS — Z510 Encounter for antineoplastic radiation therapy: Secondary | ICD-10-CM | POA: Diagnosis not present

## 2020-04-25 NOTE — Telephone Encounter (Signed)
Scheduled appts per 2/24 los. Pt confirmed appt date/time/virtual visit type.

## 2020-04-28 ENCOUNTER — Ambulatory Visit
Admission: RE | Admit: 2020-04-28 | Discharge: 2020-04-28 | Disposition: A | Discharge status: Home / Self Care | Payer: 59 | Source: Ambulatory Visit | Attending: Radiation Oncology | Admitting: Radiation Oncology

## 2020-04-28 DIAGNOSIS — Z510 Encounter for antineoplastic radiation therapy: Secondary | ICD-10-CM | POA: Diagnosis not present

## 2020-04-29 ENCOUNTER — Other Ambulatory Visit: Payer: Self-pay

## 2020-04-29 ENCOUNTER — Ambulatory Visit (INDEPENDENT_AMBULATORY_CARE_PROVIDER_SITE_OTHER): Payer: 59 | Admitting: Addiction (Substance Use Disorder)

## 2020-04-29 ENCOUNTER — Ambulatory Visit
Admission: RE | Admit: 2020-04-29 | Discharge: 2020-04-29 | Disposition: A | Discharge status: Home / Self Care | Payer: 59 | Source: Ambulatory Visit | Attending: Radiation Oncology | Admitting: Radiation Oncology

## 2020-04-29 DIAGNOSIS — M25612 Stiffness of left shoulder, not elsewhere classified: Secondary | ICD-10-CM | POA: Diagnosis present

## 2020-04-29 DIAGNOSIS — Z483 Aftercare following surgery for neoplasm: Secondary | ICD-10-CM | POA: Diagnosis present

## 2020-04-29 DIAGNOSIS — Z170 Estrogen receptor positive status [ER+]: Secondary | ICD-10-CM | POA: Insufficient documentation

## 2020-04-29 DIAGNOSIS — C50412 Malignant neoplasm of upper-outer quadrant of left female breast: Secondary | ICD-10-CM | POA: Insufficient documentation

## 2020-04-29 DIAGNOSIS — F4323 Adjustment disorder with mixed anxiety and depressed mood: Secondary | ICD-10-CM | POA: Diagnosis not present

## 2020-04-29 NOTE — Progress Notes (Signed)
      [facility] Counselor/Therapist Progress Note  Patient ID: Michaela Roman, MRN: [id number],    Date: 04/29/2020  Time Spent: 63mins  Treatment Type: Individual Therapy  Reported Symptoms: feeling less than/not worthy.  Mental Status Exam:  Appearance:   Casual     Behavior:  Appropriate and Sharing  Motor:  Normal  Speech/Language:   Clear and Coherent and Normal Rate  Affect:  Appropriate and Congruent  Mood:  irritable and sad   Thought process:  normal  Thought content:    Rumination  Sensory/Perceptual disturbances:    WNL  Orientation:  x4  Attention:  Good  Concentration:  Good  Memory:  WNL  Fund of knowledge:   Good  Insight:    Good  Judgment:   Good  Impulse Control:  Good   Risk Assessment: Danger to Self:  No. Denied.  Self-injurious Behavior: No Danger to Others: No Duty to Warn:no Physical Aggression / Violence:No  Access to Firearms a concern: No  Gang Involvement:No   Subjective: Client reported recognizing patterns of hers that come from feeling challenged or interrogated by her husband or other men (esp) in her life. Client processed this stemming back from childhood and the memories associated with this affecting her in adulthood. Client processed a specific situation with her husband regarding having handymen parking in the driveway and how she felt judged or like her husband was calling her dumb for having them park in the driveway. Therapist used MI to normalize her feelings, support her, and help her to validate her pain from the past when she was judged/questioned for no reason by others. Therapist also used MI & CBT with client regarding her thoughts about this situation, while helping to empower her to make some of those choices and stand behind them, while still telling him what he is doing to challenge her decision (intentional or not), is causing her harm. Therapist assessed client's safety and client denied SI/HI/AVH.    Interventions: Cognitive Behavioral Therapy, Mindfulness Meditation and Motivational Interviewing  Diagnosis:   ICD-10-CM   1. Adjustment disorder with mixed anxiety and depressed mood  F43.23     Plan of Care: Client to return for weekly therapy with Sammuel Cooper, therapist, to review again in 6 months. Client is to continue seeing medication provider for support of mood management.   Client to engage in CBT: challenging negative internal ruminationsand self-talk AEB journaling daily or expressing thoughts to support persons in their life and then challenging it with truth.  Client to engage in tapping to tap in healthy cognition that challenges negative rumination or deep negative core belief.  Client to practice DBT distress tolerance skills to decrease crying spells and thoughts of not being able to endure their suffering AEB using mindfulness, deep breathing, and TIP to increase tolerance for discomfort, discharge emotional distress, and increase their understanding that they can do hard things.  Client to utilize BSP (brainspotting) with therapist to help client identify and process triggers for their depressive episode and anxiety with goal of reducing said SUDs caused by depression/anxiety by 33% in the next 6 months. Client to prioritize sleep 8+ hours each week night AEB going to bed by 10pm each night.  Barnie Del, LCSW, LCAS, CCTP, CCS-I, BSP

## 2020-04-30 ENCOUNTER — Ambulatory Visit: Payer: 59 | Attending: Surgery

## 2020-04-30 ENCOUNTER — Ambulatory Visit
Admission: RE | Admit: 2020-04-30 | Discharge: 2020-04-30 | Disposition: A | Discharge status: Home / Self Care | Payer: 59 | Source: Ambulatory Visit | Attending: Radiation Oncology | Admitting: Radiation Oncology

## 2020-04-30 DIAGNOSIS — Z170 Estrogen receptor positive status [ER+]: Secondary | ICD-10-CM

## 2020-04-30 DIAGNOSIS — M25612 Stiffness of left shoulder, not elsewhere classified: Secondary | ICD-10-CM | POA: Insufficient documentation

## 2020-04-30 DIAGNOSIS — C50412 Malignant neoplasm of upper-outer quadrant of left female breast: Secondary | ICD-10-CM | POA: Insufficient documentation

## 2020-04-30 DIAGNOSIS — Z483 Aftercare following surgery for neoplasm: Secondary | ICD-10-CM

## 2020-04-30 NOTE — Therapy (Signed)
[place], [place], [address] Phone: [phone]   Fax:  [phone]  Physical Therapy Treatment  Patient Details  Name: Michaela Roman MRN: [id number] Date of Birth: 1970/10/29 Referring Provider (PT): Dr. Erroll Luna   Encounter Date: 04/30/2020   PT End of Session - 04/30/20 1736    Visit Number 7    Number of Visits 16    Date for PT Re-Evaluation 06/11/20    PT Start Time 1324    PT Stop Time 1700    PT Time Calculation (min) 56 min    Activity Tolerance Patient tolerated treatment well    Behavior During Therapy [facility] for tasks assessed/performed           Past Medical History:  Diagnosis Date  . Anxiety   . Arthritis    OP  . Breast cancer ([place])   . Depression   . Family history of breast cancer 02/13/2020  . Hormone disorder   . Hypothyroidism   . 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 Premature ovarian failure    Dx'd age 64  . Thyroid disease    hypothyroid    Past Surgical History:  Procedure Laterality Date  . BREAST LUMPECTOMY WITH RADIOACTIVE SEED AND SENTINEL LYMPH NODE BIOPSY Left 03/12/2020   Procedure: LEFT BREAST LUMPECTOMY WITH RADIOACTIVE SEED AND LEFT TARGETED RADIOACTIVE SEED LYMPH NODE BIOPSY AND LEFT SENTINEL LYMPH NODE MAPPING;  Surgeon: Erroll Luna, MD;  Location: [place];  Service: General;  Laterality: Left;  . DILATATION & CURETTAGE/HYSTEROSCOPY WITH MYOSURE N/A 10/07/2015   Procedure: DILATATION & CURETTAGE/HYSTEROSCOPY WITH MYOSURE;  Surgeon: Nunzio Cobbs, MD;  Location: [facility];  Service: Gynecology;  Laterality: N/A;  endometrial polyp  . WISDOM TOOTH EXTRACTION      There were no vitals filed for this visit.   Subjective Assessment - 04/30/20 1602    Subjective Got sleeve on Friday and tried it on, but havent actually worn it yet. I can tell I am getting stronger.  I still feel a liltle pulling in the axillary region.  Radiation is  going OK.  Started to feel a little bit of a sore throat today and under armpit is a little sore. Compliant with MLD every day and scar massage and that seems much better. Would like to feel more comfortable with strengthening/ROM as I progress with radiation.             [facility] PT Assessment - 04/30/20 0001      AROM   Left Shoulder Extension 55 Degrees    Left Shoulder Flexion 163 Degrees    Left Shoulder ABduction 170 Degrees    Left Shoulder Internal Rotation 62 Degrees    Left Shoulder External Rotation 105 Degrees                 Quick Dash - 04/30/20 0001    Open a tight or new jar No difficulty    Do heavy household chores (wash walls, wash floors) No difficulty    Carry a shopping bag or briefcase No difficulty    Wash your back No difficulty    Use a knife to cut food No difficulty    Recreational activities in which you take some force or impact through your arm, shoulder, or hand (golf, hammering, tennis) No difficulty    During the past week, to what extent has your arm, shoulder or hand problem interfered with your normal social activities  with family, friends, neighbors, or groups? Not at all    During the past week, to what extent has your arm, shoulder or hand problem limited your work or other regular daily activities Not at all    Arm, shoulder, or hand pain. None    Tingling (pins and needles) in your arm, shoulder, or hand None    Difficulty Sleeping No difficulty    DASH Score 0 %                  [facility] Adult PT Treatment/Exercise - 04/30/20 0001      Manual Therapy   Edema Management pt educated in proper way to don/doff compression garment.  Sleeve fits well and pt is independent in donning.    Myofascial Release very little cording noted today    Manual Lymphatic Drainage (MLD) reviewed with pt,supraclavicular, 5 breaths, left axillary and left inguinal LN, Left axillo-inguinal pahway, and left lateral breast retracing all steps.     Passive ROM Left shoulder flexion, abd, ER, IR, D2 flex                  PT Education - 04/30/20 1734    Education Details Pt was educated in standing jobes flex, scaption and abd and was given illustrated and written instructions    Person(s) Educated Patient    Methods Explanation;Demonstration;Handout    Comprehension Verbalized understanding;Returned demonstration               PT Long Term Goals - 04/30/20 1624      PT LONG TERM GOAL #1   Title Patient will demonstrate she has regained full shoulder ROM and function post operatively compared to baselines.    Time 4    Period Weeks    Status Achieved      PT LONG TERM GOAL #2   Title Patient will increase left shoulder flexion to >/= 150 degrees for increased ease reaching.    Time 4    Period Weeks    Status Achieved      PT LONG TERM GOAL #3   Title Patient will increase left shoulder abduction to >/= 165 degrees for increased ease reaching.    Time 4    Period Weeks    Status Achieved      PT LONG TERM GOAL #4   Title Patient will improve her DASH score to be </= 10 for improved overall uppre extremity function.    Baseline 0 today    Time 4    Period Weeks    Status Achieved      PT LONG TERM GOAL #5   Title Patient will report >/= 50% improvement in left axillary cording.    Time 4    Period Weeks    Status Achieved      Additional Long Term Goals   Additional Long Term Goals Yes      PT LONG TERM GOAL #6   Title Pt will be fit for class 1 sleeve/gauntlet and will demonstrate proper donning/doffing and review times to wear    Period Weeks    Status Achieved      PT LONG TERM GOAL #7   Title Pt will return demonstrate proper Self MLD to left breast    Time 1    Period Weeks    Status Achieved      PT LONG TERM GOAL #8   Title Pt will be educated in [facility] strength program and will be independent in  its performance    Time 6    Period Weeks    Status New    Target Date 06/11/20       PT LONG TERM GOAL  #9   TITLE Pt will maintain full shoulder ROM as she progresses through radiation    Time 6    Period Weeks    Status New    Target Date 06/11/20                 Plan - 04/30/20 1737    Clinical Impression Statement Pt has established goals for ROM and performance of MLD.  She was able to don/doff sleeve correctly in clinic and has understanding of when she should wear it.  She did an excellent job return demonstrating MLD.  She continues with mild fibrosis at areolar incision and may benefit from a chip pack,  She will also benefit from further Right UE strengthening and proper way to progress reps/resistance.  ABC strengthening work out  would be beneficial for pt.    Stability/Clinical Decision Making Stable/Uncomplicated    Rehab Potential Excellent    PT Frequency 1x / week    PT Duration 6 weeks    PT Treatment/Interventions ADLs/Self Care Home Management;Therapeutic exercise;Patient/family education;Manual techniques;Passive range of motion;Scar mobilization;Manual lymph drainage    PT Next Visit Plan instruct ABC strength program, assess arm for cording/ROM, chip pack for areolar incision?    PT Home Exercise Plan Post op shoulder ROM, standing jobes, standing theraband exs, left breast MLD    Consulted and Agree with Plan of Care Patient           Patient will benefit from skilled therapeutic intervention in order to improve the following deficits and impairments:  Postural dysfunction,Decreased range of motion,Decreased knowledge of precautions,Impaired UE functional use,Pain,Decreased scar mobility,Decreased skin integrity,Increased fascial restricitons,Increased edema,Decreased strength  Visit Diagnosis: Malignant neoplasm of upper-outer quadrant of left breast in female, estrogen receptor positive ([place])  Aftercare following surgery for neoplasm  Stiffness of left shoulder, not elsewhere classified     Problem List Patient Active Problem List    Diagnosis Date Noted  . 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 Genetic testing 02/21/2020  . Family history of prostate cancer 02/13/2020  . Family history of breast cancer 02/13/2020  . Family history of colon cancer 02/13/2020  . Malignant neoplasm of upper-outer quadrant of left breast in female, estrogen receptor positive ([place]) 02/06/2020    Claris Pong 04/30/2020, 5:49 PM  [place], [place], [address] Phone: [phone]   Fax:  [phone]  Name: Michaela Roman MRN: [id number] Date of Birth: 11-Mar-1970 Cheral Almas, PT 04/30/20 5:51 PM

## 2020-04-30 NOTE — Patient Instructions (Signed)
Access Code: [email or web address] URL: [email or web address] Date: 04/30/2020 Prepared by: Cheral Almas  Exercises Standing Shoulder Flexion to 90 Degrees with Dumbbells - 1 x daily - 3 x weekly - 1 sets - 10 reps Scaption with Dumbbells - 1 x daily - 3 x weekly - 1 sets - 10 reps Shoulder Abduction with Dumbbells - Thumbs Up - 1 x daily - 3 x weekly - 1 sets - 10 reps

## 2020-05-01 ENCOUNTER — Ambulatory Visit
Admission: RE | Admit: 2020-05-01 | Discharge: 2020-05-01 | Disposition: A | Discharge status: Home / Self Care | Payer: 59 | Source: Ambulatory Visit | Attending: Radiation Oncology | Admitting: Radiation Oncology

## 2020-05-01 DIAGNOSIS — C50412 Malignant neoplasm of upper-outer quadrant of left female breast: Secondary | ICD-10-CM | POA: Diagnosis not present

## 2020-05-02 ENCOUNTER — Ambulatory Visit
Admission: RE | Admit: 2020-05-02 | Discharge: 2020-05-02 | Disposition: A | Discharge status: Home / Self Care | Payer: 59 | Source: Ambulatory Visit | Attending: Radiation Oncology | Admitting: Radiation Oncology

## 2020-05-02 DIAGNOSIS — C50412 Malignant neoplasm of upper-outer quadrant of left female breast: Secondary | ICD-10-CM | POA: Diagnosis not present

## 2020-05-05 ENCOUNTER — Ambulatory Visit
Admission: RE | Admit: 2020-05-05 | Discharge: 2020-05-05 | Disposition: A | Discharge status: Home / Self Care | Payer: 59 | Source: Ambulatory Visit | Attending: Radiation Oncology | Admitting: Radiation Oncology

## 2020-05-05 DIAGNOSIS — C50412 Malignant neoplasm of upper-outer quadrant of left female breast: Secondary | ICD-10-CM | POA: Diagnosis not present

## 2020-05-06 ENCOUNTER — Other Ambulatory Visit: Payer: Self-pay

## 2020-05-06 ENCOUNTER — Ambulatory Visit (INDEPENDENT_AMBULATORY_CARE_PROVIDER_SITE_OTHER): Payer: 59 | Admitting: Addiction (Substance Use Disorder)

## 2020-05-06 ENCOUNTER — Ambulatory Visit
Admission: RE | Admit: 2020-05-06 | Discharge: 2020-05-06 | Disposition: A | Discharge status: Home / Self Care | Payer: 59 | Source: Ambulatory Visit | Attending: Radiation Oncology | Admitting: Radiation Oncology

## 2020-05-06 DIAGNOSIS — F4323 Adjustment disorder with mixed anxiety and depressed mood: Secondary | ICD-10-CM | POA: Diagnosis not present

## 2020-05-06 DIAGNOSIS — C50412 Malignant neoplasm of upper-outer quadrant of left female breast: Secondary | ICD-10-CM | POA: Diagnosis not present

## 2020-05-06 NOTE — Progress Notes (Signed)
      [facility] Counselor/Therapist Progress Note  Patient ID: Michaela Roman, MRN: [id number],    Date: 05/06/2020  Time Spent: 58mins  Treatment Type: Individual Therapy  Reported Symptoms: feeling alone.   Mental Status Exam:  Appearance:   Casual     Behavior:  Appropriate and Sharing  Motor:  Normal  Speech/Language:   Clear and Coherent and Normal Rate  Affect:  Appropriate and Congruent  Mood:  anxious and sad   Thought process:  normal  Thought content:    Rumination  Sensory/Perceptual disturbances:    WNL  Orientation:  x4  Attention:  Good  Concentration:  Good  Memory:  WNL  Fund of knowledge:   Good  Insight:    Good  Judgment:   Good  Impulse Control:  Good   Risk Assessment: Danger to Self:  No. Denied.  Self-injurious Behavior: No Danger to Others: No Duty to Warn:no Physical Aggression / Violence:No  Access to Firearms a concern: No  Gang Involvement:No   Subjective: Client reported feeling sad and feeling alone; client identified her trigger being times she feels overwhelmed by things she cant do post surgery & she wishes her husband would help her without her having to ask. Client processed the stress around having to ask for support and feel like a burden. Therapist used MI & CBT with client to support her and help her identify her feelings and core belief that she needs to challenge. Therapist assessed client's safety and client denied SI/HI/AVH.   Interventions: Cognitive Behavioral Therapy and Motivational Interviewing  Diagnosis:   ICD-10-CM   1. Adjustment disorder with mixed anxiety and depressed mood  F43.23    Plan of Care: Client to return for weekly therapy with Sammuel Cooper, therapist, to review again in 6 months. Client is to continue seeing medication provider for support of mood management.   Client to engage in CBT: challenging negative internal ruminationsand self-talk AEB journaling daily or expressing thoughts to support  persons in their life and then challenging it with truth.  Client to engage in tapping to tap in healthy cognition that challenges negative rumination or deep negative core belief.  Client to practice DBT distress tolerance skills to decrease crying spells and thoughts of not being able to endure their suffering AEB using mindfulness, deep breathing, and TIP to increase tolerance for discomfort, discharge emotional distress, and increase their understanding that they can do hard things.  Client to utilize BSP (brainspotting) with therapist to help client identify and process triggers for their depressive episode and anxiety with goal of reducing said SUDs caused by depression/anxiety by 33% in the next 6 months. Client to prioritize sleep 8+ hours each week night AEB going to bed by 10pm each night.  Barnie Del, LCSW, LCAS, CCTP, CCS-I, BSP

## 2020-05-07 ENCOUNTER — Ambulatory Visit
Admission: RE | Admit: 2020-05-07 | Discharge: 2020-05-07 | Disposition: A | Discharge status: Home / Self Care | Payer: 59 | Source: Ambulatory Visit | Attending: Radiation Oncology | Admitting: Radiation Oncology

## 2020-05-07 ENCOUNTER — Ambulatory Visit: Payer: 59

## 2020-05-07 DIAGNOSIS — C50412 Malignant neoplasm of upper-outer quadrant of left female breast: Secondary | ICD-10-CM | POA: Diagnosis not present

## 2020-05-07 DIAGNOSIS — Z483 Aftercare following surgery for neoplasm: Secondary | ICD-10-CM

## 2020-05-07 DIAGNOSIS — Z170 Estrogen receptor positive status [ER+]: Secondary | ICD-10-CM

## 2020-05-07 DIAGNOSIS — M25612 Stiffness of left shoulder, not elsewhere classified: Secondary | ICD-10-CM

## 2020-05-07 NOTE — Patient Instructions (Signed)
Pt was given ABC strength handout.  We discussed instructions on 1st 2 pages, performed all stretches and quad stabilization today.

## 2020-05-07 NOTE — Therapy (Signed)
[place], [place], [address] Phone: [phone]   Fax:  [phone]  Physical Therapy Treatment  Patient Details  Name: Michaela Roman MRN: [id number] Date of Birth: 08/23/1970 Referring Provider (PT): Dr. Erroll Luna   Encounter Date: 05/07/2020   PT End of Session - 05/07/20 1937    Visit Number 8    Number of Visits 16    Date for PT Re-Evaluation 06/11/20    PT Start Time 1607    PT Stop Time 1708    PT Time Calculation (min) 61 min    Activity Tolerance Patient tolerated treatment well    Behavior During Therapy [facility] for tasks assessed/performed           Past Medical History:  Diagnosis Date  . Anxiety   . Arthritis    OP  . Breast cancer ([place])   . Depression   . Family history of breast cancer 02/13/2020  . Hormone disorder   . Hypothyroidism   . 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 Premature ovarian failure    Dx'd age 65  . Thyroid disease    hypothyroid    Past Surgical History:  Procedure Laterality Date  . BREAST LUMPECTOMY WITH RADIOACTIVE SEED AND SENTINEL LYMPH NODE BIOPSY Left 03/12/2020   Procedure: LEFT BREAST LUMPECTOMY WITH RADIOACTIVE SEED AND LEFT TARGETED RADIOACTIVE SEED LYMPH NODE BIOPSY AND LEFT SENTINEL LYMPH NODE MAPPING;  Surgeon: Erroll Luna, MD;  Location: [place];  Service: General;  Laterality: Left;  . DILATATION & CURETTAGE/HYSTEROSCOPY WITH MYOSURE N/A 10/07/2015   Procedure: DILATATION & CURETTAGE/HYSTEROSCOPY WITH MYOSURE;  Surgeon: Nunzio Cobbs, MD;  Location: [facility];  Service: Gynecology;  Laterality: N/A;  endometrial polyp  . WISDOM TOOTH EXTRACTION      There were no vitals filed for this visit.   Subjective Assessment - 05/07/20 1606    Subjective Got hit really hard with fatigue yesterday and today. A few days after radiation my right shoulder feels so tight under the scapula.  It improves during the day and with  stretching. I wonder if its the way I am sleeping. Left axilla feels like it has a little bit of sunburn and tightness but not bad. My breast seems a little tender since yesterday.  Today was my 15th treatment so I am almost halfway through.  My bra also seems to be rubbing just outside my right shoulder blade and the skin is irritated there. I have been doing the MLD and swelling is pretty good.  You forgot to make the chip pack last time    Pertinent History Patient was diagnosed on 01/23/2020 with left 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 Patient underwent a left lumpectomy and sentinel node biopsy (1/4 positive) on 03/12/2020. It is ER/PR positive and HER2 negative with a Ki67 of 30%.    Patient Stated Goals SEe if my arm is doing ok    Currently in Pain? No/denies    Pain Score 0-No pain    Pain Onset 1 to 4 weeks ago    Pain Frequency Intermittent                             [facility] Adult PT Treatment/Exercise - 05/07/20 0001      Shoulder Exercises: Standing   Other Standing Exercises Pt educated in ABC strength warmups. stretching,and quad stab exs. and performed each as instructed  Manual Therapy   Edema Management chip pack made for left breast    Soft tissue mobilization STW bilateral UT,Levator                  PT Education - 05/07/20 1935    Education Details Pt was educated in warms ups, stretching and beginning of core stabs for ABC strength workout. She was given illustrated and written instructions.  We also discussed chip pack and how to wear. Discussed ways to fix the strap on her bra to prevent rubbing.    Person(s) Educated Patient    Methods Explanation;Demonstration;Handout    Comprehension Verbalized understanding;Returned demonstration               PT Long Term Goals - 04/30/20 1624      PT LONG TERM GOAL #1   Title Patient will demonstrate she has regained full shoulder ROM and function post operatively compared  to baselines.    Time 4    Period Weeks    Status Achieved      PT LONG TERM GOAL #2   Title Patient will increase left shoulder flexion to >/= 150 degrees for increased ease reaching.    Time 4    Period Weeks    Status Achieved      PT LONG TERM GOAL #3   Title Patient will increase left shoulder abduction to >/= 165 degrees for increased ease reaching.    Time 4    Period Weeks    Status Achieved      PT LONG TERM GOAL #4   Title Patient will improve her DASH score to be </= 10 for improved overall uppre extremity function.    Baseline 0 today    Time 4    Period Weeks    Status Achieved      PT LONG TERM GOAL #5   Title Patient will report >/= 50% improvement in left axillary cording.    Time 4    Period Weeks    Status Achieved      Additional Long Term Goals   Additional Long Term Goals Yes      PT LONG TERM GOAL #6   Title Pt will be fit for class 1 sleeve/gauntlet and will demonstrate proper donning/doffing and review times to wear    Period Weeks    Status Achieved      PT LONG TERM GOAL #7   Title Pt will return demonstrate proper Self MLD to left breast    Time 1    Period Weeks    Status Achieved      PT LONG TERM GOAL #8   Title Pt will be educated in [facility] strength program and will be independent in its performance    Time 6    Period Weeks    Status New    Target Date 06/11/20      PT LONG TERM GOAL  #9   TITLE Pt will maintain full shoulder ROM as she progresses through radiation    Time 6    Period Weeks    Status New    Target Date 06/11/20                 Plan - 05/07/20 1938    Clinical Impression Statement Therapy consisted of Review of warmup, stretching and initiation of Core strength in ABC handout with pt practicing each as indicated in handout.  Chip pack was made for incision area at left breast to  soften and decreease edema.  Soft tissue work was performed to bilateral UT/levator in supine with manual traction and  suboccipital release, and in left sidelying to right interscapular area.    Personal Factors and Comorbidities Comorbidity 2    Comorbidities Breast CA with SLNB/radiation    Stability/Clinical Decision Making Stable/Uncomplicated    Clinical Decision Making Low    Rehab Potential Excellent    PT Frequency 1x / week    PT Duration 6 weeks    PT Treatment/Interventions ADLs/Self Care Home Management;Therapeutic exercise;Patient/family education;Manual techniques;Passive range of motion;Scar mobilization;Manual lymph drainage    PT Next Visit Plan instruct ABC strength program, assess arm for cording/ROM, check benefit of chip pack for areolar incision    PT Home Exercise Plan Post op shoulder ROM, standing jobes, standing theraband exs, left breast MLD    Consulted and Agree with Plan of Care Patient           Patient will benefit from skilled therapeutic intervention in order to improve the following deficits and impairments:  Postural dysfunction,Decreased range of motion,Decreased knowledge of precautions,Impaired UE functional use,Pain,Decreased scar mobility,Decreased skin integrity,Increased fascial restricitons,Increased edema,Decreased strength  Visit Diagnosis: Malignant neoplasm of upper-outer quadrant of left breast in female, estrogen receptor positive ([place])  Aftercare following surgery for neoplasm  Stiffness of left shoulder, not elsewhere classified     Problem List Patient Active Problem List   Diagnosis Date Noted  . 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 Genetic testing 02/21/2020  . Family history of prostate cancer 02/13/2020  . Family history of breast cancer 02/13/2020  . Family history of colon cancer 02/13/2020  . Malignant neoplasm of upper-outer quadrant of left breast in female, estrogen receptor positive ([place]) 02/06/2020    Claris Pong 05/07/2020, 8:10 PM  [place], [place], [address] Phone: [phone]   Fax:  [phone]  Name: Michaela Roman MRN: [id number] Date of Birth: [birth year]  Cheral Almas, PT 05/07/20 8:17 PM

## 2020-05-08 ENCOUNTER — Ambulatory Visit
Admission: RE | Admit: 2020-05-08 | Discharge: 2020-05-08 | Disposition: A | Discharge status: Home / Self Care | Payer: 59 | Source: Ambulatory Visit | Attending: Radiation Oncology | Admitting: Radiation Oncology

## 2020-05-08 DIAGNOSIS — C50412 Malignant neoplasm of upper-outer quadrant of left female breast: Secondary | ICD-10-CM | POA: Diagnosis not present

## 2020-05-09 ENCOUNTER — Ambulatory Visit
Admission: RE | Admit: 2020-05-09 | Discharge: 2020-05-09 | Disposition: A | Discharge status: Home / Self Care | Payer: 59 | Source: Ambulatory Visit | Attending: Radiation Oncology | Admitting: Radiation Oncology

## 2020-05-09 DIAGNOSIS — C50412 Malignant neoplasm of upper-outer quadrant of left female breast: Secondary | ICD-10-CM | POA: Diagnosis not present

## 2020-05-12 ENCOUNTER — Ambulatory Visit
Admission: RE | Admit: 2020-05-12 | Discharge: 2020-05-12 | Disposition: A | Discharge status: Home / Self Care | Payer: 59 | Source: Ambulatory Visit | Attending: Radiation Oncology | Admitting: Radiation Oncology

## 2020-05-12 ENCOUNTER — Ambulatory Visit (INDEPENDENT_AMBULATORY_CARE_PROVIDER_SITE_OTHER): Payer: 59 | Admitting: Addiction (Substance Use Disorder)

## 2020-05-12 ENCOUNTER — Other Ambulatory Visit: Payer: Self-pay

## 2020-05-12 DIAGNOSIS — C50412 Malignant neoplasm of upper-outer quadrant of left female breast: Secondary | ICD-10-CM | POA: Diagnosis not present

## 2020-05-12 DIAGNOSIS — F411 Generalized anxiety disorder: Secondary | ICD-10-CM | POA: Diagnosis not present

## 2020-05-12 NOTE — Progress Notes (Signed)
      [facility] Counselor/Therapist Progress Note  Patient ID: Michaela Roman, MRN: [id number],    Date: 05/12/2020  Time Spent: 63mins  Treatment Type: Individual Therapy  Reported Symptoms: stressed, negative core beliefs  Mental Status Exam:  Appearance:   Casual     Behavior:  Appropriate and Sharing  Motor:  Normal  Speech/Language:   Clear and Coherent and Normal Rate  Affect:  Appropriate and Congruent  Mood:  normal and stressed   Thought process:  normal  Thought content:    Rumination  Sensory/Perceptual disturbances:    WNL  Orientation:  x4  Attention:  Good  Concentration:  Good  Memory:  WNL  Fund of knowledge:   Good  Insight:    Good  Judgment:   Good  Impulse Control:  Good   Risk Assessment: Danger to Self:  No. Denied.  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 Client reported that "because I faced my own mortality, I shouldn't ever eat in a disordered way (I should only eat healthy things)." Client identified feeling: lonely, guilty for not being busy, etc and thinking: "youre lazy and the reason I dont succeed is bec im not working as hard as my husband." Therapist used MI & CBT to support client in processing her loneliness/other emotions and core beliefs that are negative. Therapist assessed client's safety and client denied SI/HI/AVH.   Interventions: Cognitive Behavioral Therapy and Motivational Interviewing  Diagnosis:   ICD-10-CM   1. GAD (generalized anxiety disorder)  F41.1     Plan of Care: Client to return for weekly therapy with Sammuel Cooper, therapist, to review again in 6 months. Client is to continue seeing medication provider for support of mood management.   Client to engage in CBT: challenging  negative internal ruminationsand self-talk AEB journaling daily or expressing thoughts to support persons in their life and then challenging it with truth.  Client to engage in tapping to tap in healthy cognition that challenges negative rumination or deep negative core belief.  Client to practice DBT distress tolerance skills to decrease crying spells and thoughts of not being able to endure their suffering AEB using mindfulness, deep breathing, and TIP to increase tolerance for discomfort, discharge emotional distress, and increase their understanding that they can do hard things.  Client to utilize BSP (brainspotting) with therapist to help client identify and process triggers for their depressive episode and anxiety with goal of reducing said SUDs caused by depression/anxiety by 33% in the next 6 months. Client to prioritize sleep 8+ hours each week night AEB going to bed by 10pm each night.  Barnie Del, LCSW, LCAS, CCTP, CCS-I, BSP

## 2020-05-13 ENCOUNTER — Other Ambulatory Visit: Payer: Self-pay

## 2020-05-13 ENCOUNTER — Ambulatory Visit
Admission: RE | Admit: 2020-05-13 | Discharge: 2020-05-13 | Disposition: A | Discharge status: Home / Self Care | Payer: 59 | Source: Ambulatory Visit | Attending: Radiation Oncology | Admitting: Radiation Oncology

## 2020-05-13 DIAGNOSIS — C50412 Malignant neoplasm of upper-outer quadrant of left female breast: Secondary | ICD-10-CM | POA: Diagnosis not present

## 2020-05-14 ENCOUNTER — Ambulatory Visit
Admission: RE | Admit: 2020-05-14 | Discharge: 2020-05-14 | Disposition: A | Discharge status: Home / Self Care | Payer: 59 | Source: Ambulatory Visit | Attending: Radiation Oncology | Admitting: Radiation Oncology

## 2020-05-14 ENCOUNTER — Ambulatory Visit: Payer: 59

## 2020-05-14 DIAGNOSIS — M25612 Stiffness of left shoulder, not elsewhere classified: Secondary | ICD-10-CM

## 2020-05-14 DIAGNOSIS — Z170 Estrogen receptor positive status [ER+]: Secondary | ICD-10-CM

## 2020-05-14 DIAGNOSIS — Z483 Aftercare following surgery for neoplasm: Secondary | ICD-10-CM

## 2020-05-14 DIAGNOSIS — C50412 Malignant neoplasm of upper-outer quadrant of left female breast: Secondary | ICD-10-CM | POA: Diagnosis not present

## 2020-05-14 NOTE — Therapy (Signed)
[place], [place], [address] Phone: [phone]   Fax:  [phone]  Physical Therapy Treatment  Patient Details  Name: Michaela Roman MRN: [id number] Date of Birth: 01-Jul-1970 Referring Provider (PT): Dr. Erroll Luna   Encounter Date: 05/14/2020   PT End of Session - 05/14/20 1659    Visit Number 9    Number of Visits 16    Date for PT Re-Evaluation 06/11/20    PT Start Time 1503    PT Stop Time 1620    PT Time Calculation (min) 77 min    Activity Tolerance Patient tolerated treatment well    Behavior During Therapy [facility] for tasks assessed/performed           Past Medical History:  Diagnosis Date  . Anxiety   . Arthritis    OP  . Breast cancer ([place])   . Depression   . Family history of breast cancer 02/13/2020  . Hormone disorder   . Hypothyroidism   . 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 Premature ovarian failure    Dx'd age 27  . Thyroid disease    hypothyroid    Past Surgical History:  Procedure Laterality Date  . BREAST LUMPECTOMY WITH RADIOACTIVE SEED AND SENTINEL LYMPH NODE BIOPSY Left 03/12/2020   Procedure: LEFT BREAST LUMPECTOMY WITH RADIOACTIVE SEED AND LEFT TARGETED RADIOACTIVE SEED LYMPH NODE BIOPSY AND LEFT SENTINEL LYMPH NODE MAPPING;  Surgeon: Erroll Luna, MD;  Location: [facility];  Service: General;  Laterality: Left;  . DILATATION & CURETTAGE/HYSTEROSCOPY WITH MYOSURE N/A 10/07/2015   Procedure: DILATATION & CURETTAGE/HYSTEROSCOPY WITH MYOSURE;  Surgeon: Nunzio Cobbs, MD;  Location: [facility];  Service: Gynecology;  Laterality: N/A;  endometrial polyp  . WISDOM TOOTH EXTRACTION      There were no vitals filed for this visit.   Subjective Assessment - 05/14/20 1501    Subjective Fatigue is better and started to feel more energized on Friday. Both sides of scapula still feel tight but more on the right side.  A little left breast tenderness but no  blisters or itching. I have been wearing the chip pack and it does seem to soften the area. I can still wear the bra but it still rubs just a little. Shoulder ROM feels pretty good. I did notice if I don't do my shoulder stretches 1 day I can tell. Iwent through the [facility] packet and started at 1 lb, and I did 2x's at 2.5 lbs per hand. Have a rash on my left UE that I have had for years that varies where it goes. It seems to be happening more often now.  I am not able to wear my sleeve now. I have an appt with a dermatologist on Monday.    Pertinent History Patient was diagnosed on 01/23/2020 with left 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 Patient underwent a left lumpectomy and sentinel node biopsy (1/4 positive) on 03/12/2020. It is ER/PR positive and HER2 negative with a Ki67 of 30%.    Patient Stated Goals See if my arm is doing ok    Currently in Pain? No/denies    Pain Score 0-No pain                             [facility] Adult PT Treatment/Exercise - 05/14/20 0001      Lumbar Exercises: Aerobic   Stationary Bike 5.5 min  Shoulder Exercises: Standing   Other Standing Exercises Pt. performed ABC strength exs for shoulders and lower extremities today with 3# wts.  She was also shown how to add the airex to select exs to add more balance and core strength.      Manual Therapy   Soft tissue mobilization STW bilateral UT,Levator, posterior cervicals in supine and in sidelying interscapular areas to UT bilaterally.  Twitch response noted in right rhomboids.                  PT Education - 05/14/20 1658    Education Details ABC strength packet completed today    Person(s) Educated Patient    Methods Explanation;Handout;Verbal cues    Comprehension Returned demonstration               PT Long Term Goals - 05/14/20 1717      PT LONG TERM GOAL #1   Title Patient will demonstrate she has regained full shoulder ROM and function post operatively  compared to baselines.    Time 4    Period Weeks    Status Achieved      PT LONG TERM GOAL #2   Title Patient will increase left shoulder flexion to >/= 150 degrees for increased ease reaching.    Time 4    Period Weeks    Status Achieved      PT LONG TERM GOAL #3   Title Patient will increase left shoulder abduction to >/= 165 degrees for increased ease reaching.    Time 4    Period Weeks    Status Achieved      PT LONG TERM GOAL #4   Title Patient will improve her DASH score to be </= 10 for improved overall uppre extremity function.    Baseline 0 today    Period Weeks    Status Achieved      PT LONG TERM GOAL #5   Title Patient will report >/= 50% improvement in left axillary cording.    Period Weeks    Status Achieved      PT LONG TERM GOAL #6   Title Pt will be fit for class 1 sleeve/gauntlet and will demonstrate proper donning/doffing and review times to wear    Time 1    Period Weeks    Status Achieved      PT LONG TERM GOAL #7   Title Pt will return demonstrate proper Self MLD to left breast    Time 1    Period Weeks    Status Achieved      PT LONG TERM GOAL #8   Title Pt will be educated in [facility] strength program and will be independent in its performance    Time 6    Period Weeks    Status Achieved      PT LONG TERM GOAL  #9   TITLE Pt will maintain full shoulder ROM as she progresses through radiation    Time 6    Period Weeks    Status On-going                 Plan - 05/14/20 1700    Clinical Impression Statement Pt used excellent form with all ABC strength exercises performed in clinic today and she has a good understanding how to progress.  She required very little cueing for technique.  She was instructed in how to progress some exs by adding balance with the airex pad which she liked.  Soft tissue  work was performed to bilateral interscapular regions, UT/ levator and posterior cervicals secondary to pt complaints of tightness.  Twitch  response was noted in right rhomboids and increased tissue tension was noted in UT/Levator and rhomboids bilaterally.  Pt. felt better after rx. She is very compliant with her HEP and MLD.  She has achieved all goals except for maintaining shoulder ROM throughout Radiation, however she does continue with muscular tightness in her back and neck   Personal Factors and Comorbidities Comorbidity 2    Comorbidities Breast CA with SLNB/radiation    Stability/Clinical Decision Making Stable/Uncomplicated    Rehab Potential Excellent    PT Frequency 1x / week    PT Duration 6 weeks    PT Treatment/Interventions ADLs/Self Care Home Management;Therapeutic exercise;Patient/family education;Manual techniques;Passive range of motion;Scar mobilization;Manual lymph drainage    PT Next Visit Plan assess arm for cording/ROM, check benefit of chip pack for areolar incision, STW prn    PT Home Exercise Plan Post op shoulder ROM, standing jobes, standing theraband exs, left breast MLD, ABC strength    Consulted and Agree with Plan of Care Patient           Patient will benefit from skilled therapeutic intervention in order to improve the following deficits and impairments:  Postural dysfunction,Decreased range of motion,Decreased knowledge of precautions,Impaired UE functional use,Pain,Decreased scar mobility,Decreased skin integrity,Increased fascial restricitons,Increased edema,Decreased strength  Visit Diagnosis: Aftercare following surgery for neoplasm  Stiffness of left shoulder, not elsewhere classified  Malignant neoplasm of upper-outer quadrant of left breast in female, estrogen receptor positive ([place])     Problem List Patient Active Problem List   Diagnosis Date Noted  . 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 Genetic testing 02/21/2020  . Family history of prostate cancer 02/13/2020  . Family history of breast cancer 02/13/2020  . Family history of colon cancer 02/13/2020  . Malignant  neoplasm of upper-outer quadrant of left breast in female, estrogen receptor positive ([place]) 02/06/2020    Claris Pong 05/14/2020, 5:19 PM  [place], [place], [address] Phone: [phone]   Fax:  [phone]  Name: KEYERA HATTABAUGH MRN: [id number] Date of Birth: 03-19-1970 Cheral Almas, PT 05/14/20 5:24 PM

## 2020-05-14 NOTE — Patient Instructions (Signed)
Pt has a copy of the ABC strength class.  We have now worked through all of the activities and pt has excellent form and good knowledge of how to progress

## 2020-05-15 ENCOUNTER — Ambulatory Visit
Admission: RE | Admit: 2020-05-15 | Discharge: 2020-05-15 | Disposition: A | Discharge status: Home / Self Care | Payer: 59 | Source: Ambulatory Visit | Attending: Radiation Oncology | Admitting: Radiation Oncology

## 2020-05-15 ENCOUNTER — Other Ambulatory Visit: Payer: Self-pay

## 2020-05-15 DIAGNOSIS — C50412 Malignant neoplasm of upper-outer quadrant of left female breast: Secondary | ICD-10-CM | POA: Diagnosis not present

## 2020-05-16 ENCOUNTER — Other Ambulatory Visit: Payer: Self-pay

## 2020-05-16 ENCOUNTER — Ambulatory Visit
Admission: RE | Admit: 2020-05-16 | Discharge: 2020-05-16 | Disposition: A | Discharge status: Home / Self Care | Payer: 59 | Source: Ambulatory Visit | Attending: Radiation Oncology | Admitting: Radiation Oncology

## 2020-05-16 DIAGNOSIS — C50412 Malignant neoplasm of upper-outer quadrant of left female breast: Secondary | ICD-10-CM | POA: Diagnosis not present

## 2020-05-19 ENCOUNTER — Other Ambulatory Visit: Payer: Self-pay

## 2020-05-19 ENCOUNTER — Ambulatory Visit
Admission: RE | Admit: 2020-05-19 | Discharge: 2020-05-19 | Disposition: A | Discharge status: Home / Self Care | Payer: 59 | Source: Ambulatory Visit | Attending: Radiation Oncology | Admitting: Radiation Oncology

## 2020-05-19 DIAGNOSIS — C50412 Malignant neoplasm of upper-outer quadrant of left female breast: Secondary | ICD-10-CM | POA: Diagnosis not present

## 2020-05-20 ENCOUNTER — Ambulatory Visit: Payer: 59 | Admitting: Radiation Oncology

## 2020-05-20 ENCOUNTER — Ambulatory Visit
Admission: RE | Admit: 2020-05-20 | Discharge: 2020-05-20 | Disposition: A | Discharge status: Home / Self Care | Payer: 59 | Source: Ambulatory Visit | Attending: Radiation Oncology | Admitting: Radiation Oncology

## 2020-05-20 ENCOUNTER — Ambulatory Visit: Payer: 59 | Admitting: Addiction (Substance Use Disorder)

## 2020-05-20 ENCOUNTER — Ambulatory Visit (INDEPENDENT_AMBULATORY_CARE_PROVIDER_SITE_OTHER): Payer: 59 | Admitting: Addiction (Substance Use Disorder)

## 2020-05-20 DIAGNOSIS — C50412 Malignant neoplasm of upper-outer quadrant of left female breast: Secondary | ICD-10-CM | POA: Diagnosis not present

## 2020-05-20 DIAGNOSIS — F411 Generalized anxiety disorder: Secondary | ICD-10-CM

## 2020-05-20 DIAGNOSIS — F4323 Adjustment disorder with mixed anxiety and depressed mood: Secondary | ICD-10-CM | POA: Diagnosis not present

## 2020-05-20 DIAGNOSIS — Z170 Estrogen receptor positive status [ER+]: Secondary | ICD-10-CM

## 2020-05-20 MED ORDER — RADIAPLEXRX EX GEL: Freq: Once | CUTANEOUS | Status: AC

## 2020-05-20 NOTE — Progress Notes (Signed)
      [facility] Counselor/Therapist Progress Note  Patient ID: Michaela Roman, MRN: [id number],    Date: 05/20/2020  Time Spent: 63mins  Treatment Type: Individual Therapy  Reported Symptoms: worried, ashamed of her anger towards her husband, angry, sad.  Mental Status Exam:  Appearance:   Casual     Behavior:  Appropriate and Sharing  Motor:  Normal  Speech/Language:   Clear and Coherent and Normal Rate  Affect:  Appropriate and Congruent  Mood:  angry, anxious, labile and sad   Thought process:  normal  Thought content:    Rumination  Sensory/Perceptual disturbances:    WNL  Orientation:  x4  Attention:  Good  Concentration:  Good  Memory:  WNL  Fund of knowledge:   Good  Insight:    Good  Judgment:   Good  Impulse Control:  Good   Risk Assessment: Danger to Self:  No. Denied.  Self-injurious Behavior: No Danger to Others: No Duty to Warn:no Physical Aggression / Violence:No  Access to Firearms a concern: No  Gang Involvement:No   Subjective: Client reported feeling worried about her husband and her reactions to him/hurting him. Client described a situation that brought her feelings of fear of hurting herself and frustration she has to ask for her husband's help in her post-cancer healing process. Client reported acting out of anger and lashing out and dealing with the shame of her anger towards her husband. Therapist used MI & CBT with client to support her, validate her reaction, while helping her to explore what other emotions shes feeling that she could better express to her husband. Therapist also empowered client reminding her she could take a minute to consider if she can do a project when he asks for help. Therapist assessed client's safety and client denied SI/HI/AVH.   Interventions: Cognitive Behavioral Therapy and Motivational Interviewing  Diagnosis:   ICD-10-CM   1. GAD (generalized anxiety disorder)  F41.1   2. Adjustment disorder with mixed  anxiety and depressed mood  F43.23     Plan of Care: Client to return for weekly therapy with Sammuel Cooper, therapist, to review again in 6 months. Client is to continue seeing medication provider for support of mood management.   Client to engage in CBT: challenging negative internal ruminationsand self-talk AEB journaling daily or expressing thoughts to support persons in their life and then challenging it with truth.  Client to engage in tapping to tap in healthy cognition that challenges negative rumination or deep negative core belief.  Client to practice DBT distress tolerance skills to decrease crying spells and thoughts of not being able to endure their suffering AEB using mindfulness, deep breathing, and TIP to increase tolerance for discomfort, discharge emotional distress, and increase their understanding that they can do hard things.  Client to utilize BSP (brainspotting) with therapist to help client identify and process triggers for their depressive episode and anxiety with goal of reducing said SUDs caused by depression/anxiety by 33% in the next 6 months. Client to prioritize sleep 8+ hours each week night AEB going to bed by 10pm each night.  Barnie Del, LCSW, LCAS, CCTP, CCS-I, BSP

## 2020-05-21 ENCOUNTER — Ambulatory Visit
Admission: RE | Admit: 2020-05-21 | Discharge: 2020-05-21 | Disposition: A | Discharge status: Home / Self Care | Payer: 59 | Source: Ambulatory Visit | Attending: Radiation Oncology | Admitting: Radiation Oncology

## 2020-05-21 ENCOUNTER — Other Ambulatory Visit: Payer: Self-pay

## 2020-05-21 DIAGNOSIS — C50412 Malignant neoplasm of upper-outer quadrant of left female breast: Secondary | ICD-10-CM | POA: Diagnosis not present

## 2020-05-22 ENCOUNTER — Ambulatory Visit
Admission: RE | Admit: 2020-05-22 | Discharge: 2020-05-22 | Disposition: A | Discharge status: Home / Self Care | Payer: 59 | Source: Ambulatory Visit | Attending: Radiation Oncology | Admitting: Radiation Oncology

## 2020-05-22 ENCOUNTER — Other Ambulatory Visit: Payer: Self-pay

## 2020-05-22 DIAGNOSIS — C50412 Malignant neoplasm of upper-outer quadrant of left female breast: Secondary | ICD-10-CM | POA: Diagnosis not present

## 2020-05-23 ENCOUNTER — Ambulatory Visit
Admission: RE | Admit: 2020-05-23 | Discharge: 2020-05-23 | Disposition: A | Discharge status: Home / Self Care | Payer: 59 | Source: Ambulatory Visit | Attending: Radiation Oncology | Admitting: Radiation Oncology

## 2020-05-23 DIAGNOSIS — C50412 Malignant neoplasm of upper-outer quadrant of left female breast: Secondary | ICD-10-CM | POA: Diagnosis not present

## 2020-05-26 ENCOUNTER — Ambulatory Visit
Admission: RE | Admit: 2020-05-26 | Discharge: 2020-05-26 | Disposition: A | Discharge status: Home / Self Care | Payer: 59 | Source: Ambulatory Visit | Attending: Radiation Oncology | Admitting: Radiation Oncology

## 2020-05-26 ENCOUNTER — Other Ambulatory Visit: Payer: Self-pay

## 2020-05-26 DIAGNOSIS — C50412 Malignant neoplasm of upper-outer quadrant of left female breast: Secondary | ICD-10-CM | POA: Diagnosis not present

## 2020-05-27 ENCOUNTER — Ambulatory Visit (INDEPENDENT_AMBULATORY_CARE_PROVIDER_SITE_OTHER): Payer: 59 | Admitting: Addiction (Substance Use Disorder)

## 2020-05-27 ENCOUNTER — Ambulatory Visit
Admission: RE | Admit: 2020-05-27 | Discharge: 2020-05-27 | Disposition: A | Discharge status: Home / Self Care | Payer: 59 | Source: Ambulatory Visit | Attending: Radiation Oncology | Admitting: Radiation Oncology

## 2020-05-27 DIAGNOSIS — F411 Generalized anxiety disorder: Secondary | ICD-10-CM | POA: Diagnosis not present

## 2020-05-27 DIAGNOSIS — Z170 Estrogen receptor positive status [ER+]: Secondary | ICD-10-CM

## 2020-05-27 DIAGNOSIS — C50412 Malignant neoplasm of upper-outer quadrant of left female breast: Secondary | ICD-10-CM | POA: Diagnosis not present

## 2020-05-27 NOTE — Progress Notes (Signed)
      [facility] Counselor/Therapist Progress Note  Patient ID: Michaela Roman, MRN: [id number],    Date: 05/27/2020  Time Spent: 58mins  Treatment Type: Individual Therapy  Reported Symptoms: worried, unsure  Mental Status Exam:  Appearance:   Casual     Behavior:  Appropriate and Sharing  Motor:  Normal  Speech/Language:   Clear and Coherent and Normal Rate  Affect:  Appropriate and Congruent  Mood:  anxious and labile   Thought process:  normal  Thought content:    Rumination  Sensory/Perceptual disturbances:    WNL  Orientation:  x4  Attention:  Good  Concentration:  Good  Memory:  WNL  Fund of knowledge:   Good  Insight:    Good  Judgment:   Good  Impulse Control:  Good   Risk Assessment: Danger to Self:  No. Denied.  Self-injurious Behavior: No Danger to Others: No Duty to Warn:no Physical Aggression / Violence:No  Access to Firearms a concern: No  Gang Involvement:No   Subjective: Client reported struggling with being unsure about how she feels about finishing radiation. Client not feeling relieved but feeling like the doctors or her husband feel relieved and it frustrates her and makes it hard for her to feel like she can feel something different. Therapist used MI & CBT with client to support her in processing more about what she feels and her frustration with others' expectations. Client described how her cancer journey has changed her. Client reported shes very clear about how she wants to live her life: be present and also live out her values. Client reports not wanting to punish her body and force herself to do things that dont feel safe. Therapist used mindfulness to help her tend to her feelings and remain curious. Client considering how to change her relationship with writing bec the way she thinks about it causes her to be fearful of not getting her story written. Client reports this structured expectation for herself steals her joy & her goals to let  herself find new ways to find motivation to write.  Therapist assessed client's safety and client denied SI/HI/AVH.   Interventions: Cognitive Behavioral Therapy, Mindfulness Meditation and Motivational Interviewing  Diagnosis:   ICD-10-CM   1. GAD (generalized anxiety disorder)  F41.1     Plan of Care: Client to return for weekly therapy with Sammuel Cooper, therapist, to review again in 6 months. Client is to continue seeing medication provider for support of mood management.   Client to engage in CBT: challenging negative internal ruminationsand self-talk AEB journaling daily or expressing thoughts to support persons in their life and then challenging it with truth.  Client to engage in tapping to tap in healthy cognition that challenges negative rumination or deep negative core belief.  Client to practice DBT distress tolerance skills to decrease crying spells and thoughts of not being able to endure their suffering AEB using mindfulness, deep breathing, and TIP to increase tolerance for discomfort, discharge emotional distress, and increase their understanding that they can do hard things.  Client to utilize BSP (brainspotting) with therapist to help client identify and process triggers for their depressive episode and anxiety with goal of reducing said SUDs caused by depression/anxiety by 33% in the next 6 months. Client to prioritize sleep 8+ hours each week night AEB going to bed by 10pm each night.  Barnie Del, LCSW, LCAS, CCTP, CCS-I, BSP

## 2020-05-28 ENCOUNTER — Other Ambulatory Visit: Payer: Self-pay

## 2020-05-28 ENCOUNTER — Ambulatory Visit
Admission: RE | Admit: 2020-05-28 | Discharge: 2020-05-28 | Disposition: A | Discharge status: Home / Self Care | Payer: 59 | Source: Ambulatory Visit | Attending: Radiation Oncology | Admitting: Radiation Oncology

## 2020-05-28 DIAGNOSIS — C50412 Malignant neoplasm of upper-outer quadrant of left female breast: Secondary | ICD-10-CM | POA: Diagnosis not present

## 2020-05-29 ENCOUNTER — Encounter: Payer: Self-pay | Admitting: Psychiatry

## 2020-05-29 ENCOUNTER — Ambulatory Visit (INDEPENDENT_AMBULATORY_CARE_PROVIDER_SITE_OTHER): Payer: 59 | Admitting: Psychiatry

## 2020-05-29 ENCOUNTER — Ambulatory Visit
Admission: RE | Admit: 2020-05-29 | Discharge: 2020-05-29 | Disposition: A | Discharge status: Home / Self Care | Payer: 59 | Source: Ambulatory Visit | Attending: Radiation Oncology | Admitting: Radiation Oncology

## 2020-05-29 DIAGNOSIS — C50412 Malignant neoplasm of upper-outer quadrant of left female breast: Secondary | ICD-10-CM | POA: Diagnosis not present

## 2020-05-29 DIAGNOSIS — F411 Generalized anxiety disorder: Secondary | ICD-10-CM | POA: Diagnosis not present

## 2020-05-29 MED ORDER — ESCITALOPRAM OXALATE 5 MG PO TABS: 7.5000 mg | ORAL_TABLET | Freq: Every day | ORAL | 0 refills | Status: DC

## 2020-05-29 NOTE — Progress Notes (Signed)
Michaela Roman [id number] [birth year] 50 y.o.  Subjective:   Patient ID:  Michaela Roman is a 50 y.o. (DOB Jul 10, 1970) female.  Chief Complaint:  Chief Complaint  Patient presents with  . Follow-up    Anxiety    HPI Michaela Roman presents to the office today for follow-up of anxiety and depression. She reports that increase in Lexapro seems to be helping. She reports that anxiety has been manageable. She has occasional anxious thoughts that do not feel overwhelming. Denies depressed mood.   She reports that she has 4 more radiation treatments remaining. She reports that she had some fatigue after one treatment and has otherwise tolerated treatments well. She reports Klonopin 0.25 mg po QHS has been helpful for her insomnia and she has stopped it almost a week ago and noticed sleep is not as consistent with some early and middle of the night awakenings. She sleeps 7-8 hours a night with Klonopin 0.25 mg po QHS. She reports that sleep might be affected by carpal tunnel pain. Appetite has been good. Energy and motivation have been good. Concentration has been good. Denies SI.   Past Psychiatric Medication Trials: Wellbutrin- Insomnia, racing thoughts Stopped after 2 weeks.  Zoloft- Took for a few weeks. Had insomnia. Developed hives. Lexapro Lorazepam- Had a calming effect. Not effective with decreasing middle of the night awakenings.  Melatonin- Vivid dreams     Review of Systems:  Review of Systems  Musculoskeletal: Negative for gait problem.       Increased carpal tunnel pain  Skin:       Improved rash after treatment for atopic dermatitis  Neurological: Negative for tremors.  Psychiatric/Behavioral:       Please refer to HPI    Medications: I have reviewed the patient's current medications.  Current Outpatient Medications  Medication Sig Dispense Refill  . Ascorbic Acid (VITAMIN C) 1000 MG tablet Take 1,000 mg by mouth daily.    . calcium gluconate  500 MG tablet Take 1,000 tablets by mouth daily.    . clonazePAM (KLONOPIN) 0.5 MG tablet Take 1/2-1 tablet po QHS prn insomnia 30 tablet 1  . levothyroxine (SYNTHROID) 112 MCG tablet TAKE 1 TABLET BY MOUTH ONCE DAILY BEFORE BREAKFAST (Patient taking differently: Take 112 mcg by mouth daily before breakfast. TAKE 1 TABLET BY MOUTH ONCE DAILY BEFORE BREAKFAST) 90 tablet 3  . Multiple Vitamins-Minerals (MULTIVITAMIN PO) Take 1 tablet by mouth daily.    Marland Kitchen zinc gluconate 50 MG tablet Take 50 mg by mouth daily.    Marland Kitchen escitalopram (LEXAPRO) 5 MG tablet Take 1.5 tablets (7.5 mg total) by mouth daily. 135 tablet 0  . ibuprofen (ADVIL) 800 MG tablet Take 1 tablet (800 mg total) by mouth every 8 (eight) hours as needed. (Patient not taking: No sig reported) 30 tablet 0  . triamcinolone (KENALOG) 0.1 % Apply topically 2 (two) times daily as needed.     No current facility-administered medications for this visit.    Medication Side Effects: Other: Possible decreased interest in sex  Allergies:  Allergies  Allergen Reactions  . Sertraline Hives    Past Medical History:  Diagnosis Date  . Anxiety   . Arthritis    OP  . Breast cancer ([place])   . Depression   . Family history of breast cancer 02/13/2020  . Hormone disorder   . Hypothyroidism   . 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 Premature ovarian failure    Dx'd age 58  . Thyroid  disease    hypothyroid    Family History  Problem Relation Age of Onset  . Thyroid disease Sister        hypothyroidism  . Hypertension Maternal Grandmother   . Heart disease Maternal Grandmother   . Depression Maternal Grandmother   . Diabetes Maternal Grandfather   . Other Maternal Grandfather        benign brain tumor  . Depression Mother   . Multiple sclerosis Father   . Dementia Paternal Grandmother   . Alcohol abuse Paternal Grandfather   . Breast cancer Other        MGF's niece; dx 31s  . Colon cancer Other        MGF's brother; dx 42s  .  Prostate cancer Cousin 5       maternal cousin    Social History   Socioeconomic History  . Marital status: Married    Spouse name: Not on file  . Number of children: Not on file  . Years of education: Not on file  . Highest education level: Not on file  Occupational History  . Not on file  Tobacco Use  . Smoking status: Never Smoker  . Smokeless tobacco: Never Used  Vaping Use  . Vaping Use: Never used  Substance and Sexual Activity  . Alcohol use: Yes    Alcohol/week: 2.0 standard drinks    Types: 2 Glasses of wine per week    Comment: one drink a week  . Drug use: No  . Sexual activity: Yes    Partners: Female, Female    Birth control/protection: Post-menopausal, None  Other Topics Concern  . Not on file  Social History Narrative  . Not on file   Social Determinants of Health   Financial Resource Strain: Not on file  Food Insecurity: Not on file  Transportation Needs: Not on file  Physical Activity: Not on file  Stress: Not on file  Social Connections: Not on file  Intimate Partner Violence: Not on file    Past Medical History, Surgical history, Social history, and Family history were reviewed and updated as appropriate.   Please see review of systems for further details on the patient's review from today.   Objective:   Physical Exam:  LMP 08/07/2015 (Approximate)   Physical Exam Constitutional:      General: She is not in acute distress. Musculoskeletal:        General: No deformity.  Neurological:     Mental Status: She is alert and oriented to person, place, and time.     Coordination: Coordination normal.  Psychiatric:        Attention and Perception: Attention and perception normal. She does not perceive auditory or visual hallucinations.        Mood and Affect: Mood normal. Mood is not anxious or depressed. Affect is not labile, blunt, angry or inappropriate.        Speech: Speech normal.        Behavior: Behavior normal.        Thought  Content: Thought content normal. Thought content is not paranoid or delusional. Thought content does not include homicidal or suicidal ideation. Thought content does not include homicidal or suicidal plan.        Cognition and Memory: Cognition and memory normal.        Judgment: Judgment normal.     Comments: Insight intact     Lab Review:     Component Value Date/Time   NA 141 03/07/2020  1050   NA 140 10/17/2018 0922   K 4.4 03/07/2020 1050   CL 102 03/07/2020 1050   CO2 30 03/07/2020 1050   GLUCOSE 99 03/07/2020 1050   BUN 15 03/07/2020 1050   BUN 12 10/17/2018 0922   CREATININE 0.91 03/07/2020 1050   CREATININE 1.03 (H) 02/13/2020 0821   CREATININE 0.95 08/22/2015 1653   CALCIUM 9.3 03/07/2020 1050   PROT 6.8 03/07/2020 1050   PROT 6.5 10/17/2018 0922   ALBUMIN 4.2 03/07/2020 1050   ALBUMIN 4.5 10/17/2018 0922   AST 26 03/07/2020 1050   AST 20 02/13/2020 0821   ALT 24 03/07/2020 1050   ALT 21 02/13/2020 0821   ALKPHOS 41 03/07/2020 1050   BILITOT 0.6 03/07/2020 1050   BILITOT 0.8 02/13/2020 0821   GFRNONAA >60 03/07/2020 1050   GFRNONAA >60 02/13/2020 0821   GFRAA 81 10/17/2018 0922       Component Value Date/Time   WBC 5.3 03/07/2020 1050   RBC 4.62 03/07/2020 1050   HGB 14.2 03/07/2020 1050   HGB 14.3 02/13/2020 0821   HGB 13.5 10/17/2018 0922   HCT 43.3 03/07/2020 1050   HCT 40.9 10/17/2018 0922   PLT 183 03/07/2020 1050   PLT 231 02/13/2020 0821   PLT 188 10/17/2018 0922   MCV 93.7 03/07/2020 1050   MCV 93 10/17/2018 0922   MCH 30.7 03/07/2020 1050   MCHC 32.8 03/07/2020 1050   RDW 12.0 03/07/2020 1050   RDW 13.1 10/17/2018 0922   LYMPHSABS 1.6 03/07/2020 1050   MONOABS 0.4 03/07/2020 1050   EOSABS 0.1 03/07/2020 1050   BASOSABS 0.0 03/07/2020 1050    No results found for: POCLITH, LITHIUM   No results found for: PHENYTOIN, PHENOBARB, VALPROATE, CBMZ   .res Assessment: Plan:   Discussed Klonopin usage since pt reports that she would like to  decrease dose to avoid long-term use. Discussed taking 1/4-1/2 Klonopin for the next 2 weeks and then using prn only.  Pt reports that she would like to decrease and discontinue Lexapro in the future. Recommend waiting until she is no longer needing Klonopin before dose reduction in Lexapro since having anxiety optimally controlled will likely help with process of tapering off of Klonopin. Discussed decreasing Lexapro to 5 mg daily for 4 weeks, then 5 mg 1/2 tablet daily for 4 weeks, then stopping. Advised pt that she can increase to previous dose if she experiences any worsening mood or anxiety.  Recommend continuing therapy with Sammuel Cooper, LCSW.  Pt to follow-up with this provider in 3 months or sooner if clinically indicated.  Patient advised to contact office with any questions, adverse effects, or acute worsening in signs and symptoms.    Belvie was seen today for follow-up.  Diagnoses and all orders for this visit:  GAD (generalized anxiety disorder) -     escitalopram (LEXAPRO) 5 MG tablet; Take 1.5 tablets (7.5 mg total) by mouth daily.     Please see After Visit Summary for patient specific instructions.  Future Appointments  Date Time Provider Fl[place]  05/29/2020  1:45 PM [facility] LINAC 3 [facility] None  05/30/2020  1:45 PM [facility] LINAC 3 [facility] None  06/02/2020  1:45 PM [facility] LINAC 3 [facility] None  06/03/2020  1:45 PM [facility] LINAC 3 [facility] None  06/04/2020  3:00 PM Walcott, Elsie Ra, PT [facility] None  06/09/2020  8:00 AM Suanne Marker, PTA [facility] None  06/19/2020  9:00 AM Barnie Del, LCSW [facility] None  06/24/2020  9:00 AM Barnie Del, LCSW [facility] None  07/01/2020 11:00 AM Barnie Del, LCSW [facility] None  07/08/2020 11:00 AM Barnie Del, LCSW [facility] None  07/15/2020 10:00 AM Barnie Del, LCSW [facility] None  07/22/2020  2:00 PM Barnie Del, LCSW [facility] None  08/21/2020  9:00 AM Magrinat, Virgie Dad, MD [facility] None  08/26/2020 10:30 AM  Thayer Headings, PMHNP [facility] None  10/23/2020  3:30 PM Amundson Raliegh Ip, MD [facility] None    No orders of the defined types were placed in this encounter.   -------------------------------

## 2020-05-30 ENCOUNTER — Other Ambulatory Visit: Payer: Self-pay

## 2020-05-30 ENCOUNTER — Ambulatory Visit
Admission: RE | Admit: 2020-05-30 | Discharge: 2020-05-30 | Disposition: A | Discharge status: Home / Self Care | Payer: 59 | Source: Ambulatory Visit | Attending: Radiation Oncology | Admitting: Radiation Oncology

## 2020-05-30 DIAGNOSIS — Z170 Estrogen receptor positive status [ER+]: Secondary | ICD-10-CM | POA: Insufficient documentation

## 2020-05-30 DIAGNOSIS — C50412 Malignant neoplasm of upper-outer quadrant of left female breast: Secondary | ICD-10-CM | POA: Insufficient documentation

## 2020-05-30 DIAGNOSIS — C773 Secondary and unspecified malignant neoplasm of axilla and upper limb lymph nodes: Secondary | ICD-10-CM | POA: Insufficient documentation

## 2020-05-30 DIAGNOSIS — Z510 Encounter for antineoplastic radiation therapy: Secondary | ICD-10-CM | POA: Diagnosis not present

## 2020-06-02 ENCOUNTER — Ambulatory Visit
Admission: RE | Admit: 2020-06-02 | Discharge: 2020-06-02 | Disposition: A | Discharge status: Home / Self Care | Payer: 59 | Source: Ambulatory Visit | Attending: Radiation Oncology | Admitting: Radiation Oncology

## 2020-06-02 ENCOUNTER — Other Ambulatory Visit: Payer: Self-pay

## 2020-06-02 ENCOUNTER — Encounter: Payer: Self-pay | Admitting: *Deleted

## 2020-06-02 DIAGNOSIS — Z510 Encounter for antineoplastic radiation therapy: Secondary | ICD-10-CM | POA: Diagnosis not present

## 2020-06-03 ENCOUNTER — Encounter: Payer: Self-pay | Admitting: Radiation Oncology

## 2020-06-03 ENCOUNTER — Ambulatory Visit
Admission: RE | Admit: 2020-06-03 | Discharge: 2020-06-03 | Disposition: A | Discharge status: Home / Self Care | Payer: 59 | Source: Ambulatory Visit | Attending: Radiation Oncology | Admitting: Radiation Oncology

## 2020-06-03 DIAGNOSIS — Z510 Encounter for antineoplastic radiation therapy: Secondary | ICD-10-CM | POA: Diagnosis not present

## 2020-06-04 ENCOUNTER — Ambulatory Visit: Payer: 59 | Attending: Surgery

## 2020-06-04 ENCOUNTER — Other Ambulatory Visit: Payer: Self-pay

## 2020-06-04 DIAGNOSIS — Z483 Aftercare following surgery for neoplasm: Secondary | ICD-10-CM | POA: Diagnosis not present

## 2020-06-04 DIAGNOSIS — M25612 Stiffness of left shoulder, not elsewhere classified: Secondary | ICD-10-CM | POA: Insufficient documentation

## 2020-06-04 DIAGNOSIS — C50412 Malignant neoplasm of upper-outer quadrant of left female breast: Secondary | ICD-10-CM

## 2020-06-04 DIAGNOSIS — Z170 Estrogen receptor positive status [ER+]: Secondary | ICD-10-CM | POA: Diagnosis present

## 2020-06-04 NOTE — Therapy (Signed)
[place], [place], [address] Phone: [phone]   Fax:  [phone]  Physical Therapy Treatment  Patient Details  Name: Michaela Roman MRN: [id number] Date of Birth: [birth year] Referring Provider (PT): Dr. Erroll Luna   Encounter Date: 06/04/2020   PT End of Session - 06/04/20 1726    Visit Number 10    Number of Visits 16    Date for PT Re-Evaluation 06/11/20    PT Start Time 1503    PT Stop Time 1550    PT Time Calculation (min) 47 min    Activity Tolerance Patient tolerated treatment well    Behavior During Therapy [facility] for tasks assessed/performed           Past Medical History:  Diagnosis Date  . Anxiety   . Arthritis    OP  . Breast cancer ([place])   . Depression   . Family history of breast cancer 02/13/2020  . Hormone disorder   . Hypothyroidism   . 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 Premature ovarian failure    Dx'd age 50  . Thyroid disease    hypothyroid    Past Surgical History:  Procedure Laterality Date  . BREAST LUMPECTOMY WITH RADIOACTIVE SEED AND SENTINEL LYMPH NODE BIOPSY Left 03/12/2020   Procedure: LEFT BREAST LUMPECTOMY WITH RADIOACTIVE SEED AND LEFT TARGETED RADIOACTIVE SEED LYMPH NODE BIOPSY AND LEFT SENTINEL LYMPH NODE MAPPING;  Surgeon: Erroll Luna, MD;  Location: [facility];  Service: General;  Laterality: Left;  . DILATATION & CURETTAGE/HYSTEROSCOPY WITH MYOSURE N/A 10/07/2015   Procedure: DILATATION & CURETTAGE/HYSTEROSCOPY WITH MYOSURE;  Surgeon: Nunzio Cobbs, MD;  Location: [facility];  Service: Gynecology;  Laterality: N/A;  endometrial polyp  . WISDOM TOOTH EXTRACTION      There were no vitals filed for this visit.   Subjective Assessment - 06/04/20 1504    Subjective Saw dermatologist and she said the rash was atopic dermatitis and it cleared up with cream. Having a flare up of carpal tunnel in both hands and saw Dr. French Ana.  I had done  alot of spray painting.  Tried the night time splints but they really werent helping.  MD  wants me to do nerve conduction studies Don't really feel the cording anymore.I. dont really notice any swelling at all but a little scar tissue in the left breast incision. Will see surgeon on Monday.  Have been doing yoga, and doing wts 2x/week.  I got up to 5# 3x 10 with UE,  and 10# with legs 3x10.    Pertinent History Patient was diagnosed on 01/23/2020 with left 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 Patient underwent a left lumpectomy and sentinel node biopsy (1/4 positive) on 03/12/2020. It is ER/PR positive and HER2 negative with a Ki67 of 30%.    Patient Stated Goals SEe if my arm is doing ok    Currently in Pain? No/denies    Pain Score 0-No pain              [facility] PT Assessment - 06/04/20 0001      Assessment   Medical Diagnosis s/p left lumpectomy and SLNB    Referring Provider (PT) Dr. [name] [place]    Onset Date/Surgical Date 03/12/20      Prior Function   Level of Independence Independent      AROM   Right Shoulder Flexion 168 Degrees    Right Shoulder ABduction 180 Degrees  Left Shoulder Extension 54 Degrees    Left Shoulder Flexion 168 Degrees    Left Shoulder ABduction 176 Degrees    Left Shoulder Internal Rotation 62 Degrees    Left Shoulder External Rotation 105 Degrees             LYMPHEDEMA/ONCOLOGY QUESTIONNAIRE - 06/04/20 0001      Right Upper Extremity Lymphedema   10 cm Proximal to Olecranon Process 29.2 cm    Olecranon Process 24 cm    10 cm Proximal to Ulnar Styloid Process 19.6 cm    Just Proximal to Ulnar Styloid Process 13.5 cm    Across Hand at [employer] 18.5 cm    At [place] of 2nd Digit 6 cm      Left Upper Extremity Lymphedema   10 cm Proximal to Olecranon Process 30 cm    Olecranon Process 24.2 cm    10 cm Proximal to Ulnar Styloid Process 19.1 cm    Just Proximal to Ulnar Styloid Process 14.3 cm    Across Hand at [employer] 18.2 cm    At [place] of 2nd Digit 6 cm           L-DEX FLOWSHEETS - 06/04/20 1500      L-DEX LYMPHEDEMA SCREENING   Measurement Type Unilateral    L-DEX MEASUREMENT EXTREMITY Upper Extremity    POSITION  Seated    DOMINANT SIDE Right    At Risk Side Left    BASELINE SCORE (UNILATERAL) -0.7    L-DEX SCORE (UNILATERAL) -0.2    VALUE CHANGE (UNILAT) 0.5                                  PT Long Term Goals - 06/04/20 1534      PT LONG TERM GOAL #1   Title Patient will demonstrate she has regained full shoulder ROM and function post operatively compared to baselines.    Time 4    Period Weeks    Status Achieved      PT LONG TERM GOAL #2   Title Patient will increase left shoulder flexion to >/= 150 degrees for increased ease reaching.    Time 4    Period Weeks    Status Achieved      PT LONG TERM GOAL #3   Title Patient will increase left shoulder abduction to >/= 165 degrees for increased ease reaching.    Time 4    Period Weeks    Status Achieved      PT LONG TERM GOAL #4   Title Patient will improve her DASH score to be </= 10 for improved overall uppre extremity function.    Baseline 0 today    Time 4    Period Weeks    Status Achieved      PT LONG TERM GOAL #5   Title Patient will report >/= 50% improvement in left axillary cording.    Time 4    Period Weeks    Status Achieved      PT LONG TERM GOAL #6   Title Pt will be fit for class 1 sleeve/gauntlet and will demonstrate proper donning/doffing and review times to wear    Period Weeks    Status Achieved      PT LONG TERM GOAL #7   Title Pt will return demonstrate proper Self MLD to left breast    Time 1  Status Achieved      PT LONG TERM GOAL #8   Title Pt will be educated in [facility] strength program and will be independent in its performance    Time 6    Period Weeks    Status Achieved      PT LONG TERM GOAL  #9   TITLE Pt will maintain full shoulder ROM as she  progresses through radiation    Time 6    Status Achieved                 Plan - 06/04/20 1727    Clinical Impression Statement pt was reassessed today for discharge.  Shoulder ROM is WNL and pt is compliant with a HEP for ROM and strengthening. She is also wearing her compression sleeve when she works out or does yard work.  There is no significant difference in circumferential measurements. There is no visible breast swelling and only 1 small nodular area at the left areola incision.  Pt has been wearing a chip pack and sports bra with good result.  We talked about transitioning to a regular bra, but keeping check to make sure she does not develop swelling.  We completed her SOZO while here today so she did not have to come in on Monday morning.  Measurement was well within range. She is being released to independent self management but was advised to call with any questions or concerns. She has achieved all goals    Personal Factors and Comorbidities Comorbidity 2    Comorbidities Breast CA with SLNB/radiation    Stability/Clinical Decision Making Stable/Uncomplicated    Rehab Potential Excellent    PT Frequency 1x / week    PT Duration 6 weeks    PT Treatment/Interventions ADLs/Self Care Home Management;Therapeutic exercise;Patient/family education;Manual techniques;Passive range of motion;Scar mobilization;Manual lymph drainage    PT Next Visit Plan Discharged to HEP    PT Home Exercise Plan Post op shoulder ROM, standing jobes, standing theraband exs, left breast MLD, ABC strength    Consulted and Agree with Plan of Care Patient           Patient will benefit from skilled therapeutic intervention in order to improve the following deficits and impairments:  Postural dysfunction,Decreased range of motion,Decreased knowledge of precautions,Impaired UE functional use,Pain,Decreased scar mobility,Decreased skin integrity,Increased fascial restricitons,Increased edema,Decreased  strength  Visit Diagnosis: Aftercare following surgery for neoplasm  Stiffness of left shoulder, not elsewhere classified  Malignant neoplasm of upper-outer quadrant of left breast in female, estrogen receptor positive ([place])     Problem List Patient Active Problem List   Diagnosis Date Noted  . 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 Genetic testing 02/21/2020  . Family history of prostate cancer 02/13/2020  . Family history of breast cancer 02/13/2020  . Family history of colon cancer 02/13/2020  . Malignant neoplasm of upper-outer quadrant of left breast in female, estrogen receptor positive ([place]) 02/06/2020  PHYSICAL THERAPY DISCHARGE SUMMARY  Visits from Start of Care: 10  Current functional level related to goals / functional outcomes:Pt. Achieved all goals established   Remaining deficits: none   Education / Equipment: Compression sleeve/gauntlet  Plan: Patient agrees to discharge.  Patient goals were met. Patient is being discharged due to meeting the stated rehab goals.  ?????        Claris Pong 06/04/2020, 5:36 PM  [place], [place], [address] Phone: [phone]   Fax:  [phone]  Name: ALVINA STROTHER MRN: [id number] Date of Birth: 1970/08/07  Cheral Almas, PT 06/04/20 5:38 PM

## 2020-06-09 ENCOUNTER — Ambulatory Visit: Payer: Self-pay

## 2020-06-19 ENCOUNTER — Other Ambulatory Visit: Payer: Self-pay

## 2020-06-19 ENCOUNTER — Ambulatory Visit (INDEPENDENT_AMBULATORY_CARE_PROVIDER_SITE_OTHER): Payer: 59 | Admitting: Addiction (Substance Use Disorder)

## 2020-06-19 DIAGNOSIS — F411 Generalized anxiety disorder: Secondary | ICD-10-CM

## 2020-06-19 NOTE — Progress Notes (Signed)
      [facility] Counselor/Therapist Progress Note  Patient ID: Michaela Roman, MRN: [id number],    Date: 06/19/2020  Time Spent: 566mins  Treatment Type: Individual Therapy  Reported Symptoms: feeling unable/broken  Mental Status Exam:  Appearance:   Casual     Behavior:  Appropriate and Sharing  Motor:  Normal  Speech/Language:   Clear and Coherent and Normal Rate  Affect:  Appropriate and Congruent  Mood:  irritable and sad   Thought process:  normal  Thought content:    Rumination  Sensory/Perceptual disturbances:    WNL  Orientation:  x4  Attention:  Good  Concentration:  Good  Memory:  WNL  Fund of knowledge:   Good  Insight:    Fair  Judgment:   Good  Impulse Control:  Good   Risk Assessment: Danger to Self:  No. Denied.  Self-injurious Behavior: No Danger to Others: No Duty to Warn:no Physical Aggression / Violence:No  Access to Firearms a concern: No  Gang Involvement:No   Subjective: Client reported feeling annoyed and broken still as shes healing. Client struggling with not feeling empowered by being a breast cancer survivor. Client still feeling her healing go slow and trying to adjust and have reasonable expectations. Client reading book called: Anti-Cancer Living and client using mindfulness with her sleep routine that reminds her of her body's ability to heal at night. Therapist used pMI & RPT to affirm client's progress incaring for herself, support her in processing her frustrations with continued healing, while also validating the struggle with feeling broken/sick/weak as a result of her fight with cancer. Therapist also worked with client to help her dream of things she wants to do for herself and build additional goals for herself. Therapist assessed client's safety and client denied SI/HI/AVH.   Interventions: Motivational Interviewing and RPT  Diagnosis:   ICD-10-CM   1. GAD (generalized anxiety disorder)  F41.1      Plan of Care: Client  to return for weekly therapy with Sammuel Cooper, therapist, to review again in 6 months. Client is to continue seeing medication provider for support of mood management.   Client to engage in CBT: challenging negative internal ruminationsand self-talk AEB journaling daily or expressing thoughts to support persons in their life and then challenging it with truth.  Client to engage in tapping to tap in healthy cognition that challenges negative rumination or deep negative core belief.  Client to practice DBT distress tolerance skills to decrease crying spells and thoughts of not being able to endure their suffering AEB using mindfulness, deep breathing, and TIP to increase tolerance for discomfort, discharge emotional distress, and increase their understanding that they can do hard things.  Client to utilize BSP (brainspotting) with therapist to help client identify and process triggers for their depressive episode and anxiety with goal of reducing said SUDs caused by depression/anxiety by 33% in the next 6 months. Client to prioritize sleep 8+ hours each week night AEB going to bed by 10pm each night.  Barnie Del, LCSW, LCAS, CCTP, CCS-I, BSP

## 2020-06-24 ENCOUNTER — Other Ambulatory Visit: Payer: Self-pay

## 2020-06-24 ENCOUNTER — Ambulatory Visit (INDEPENDENT_AMBULATORY_CARE_PROVIDER_SITE_OTHER): Payer: 59 | Admitting: Addiction (Substance Use Disorder)

## 2020-06-24 ENCOUNTER — Other Ambulatory Visit: Payer: Self-pay | Admitting: *Deleted

## 2020-06-24 DIAGNOSIS — F411 Generalized anxiety disorder: Secondary | ICD-10-CM | POA: Diagnosis not present

## 2020-06-24 MED ORDER — TAMOXIFEN CITRATE 20 MG PO TABS: 20.0000 mg | ORAL_TABLET | Freq: Every day | ORAL | 6 refills | Status: DC

## 2020-06-24 NOTE — Progress Notes (Signed)
      [facility] Counselor/Therapist Progress Note  Patient ID: Michaela Roman, MRN: [id number],    Date: 06/24/2020  Time Spent: 34mins  Treatment Type: Individual Therapy  Reported Symptoms: fragile/ unfocused  Mental Status Exam:  Appearance:   Casual     Behavior:  Appropriate and Sharing  Motor:  Normal  Speech/Language:   Clear and Coherent and Normal Rate  Affect:  Appropriate and Congruent  Mood:  anxious and sad   Thought process:  normal  Thought content:    Rumination  Sensory/Perceptual disturbances:    WNL  Orientation:  x4  Attention:  Good  Concentration:  Good  Memory:  WNL  Fund of knowledge:   Good  Insight:    Fair  Judgment:   Good  Impulse Control:  Good   Risk Assessment: Danger to Self:  No. Denied.  Self-injurious Behavior: No Danger to Others: No Duty to Warn:no Physical Aggression / Violence:No  Access to Firearms a concern: No  Gang Involvement:No   Subjective: Client reported feeling fragile and weak still when she cant do physical things like she used to. Client adjusting to her new normal and therapist used MI & BSP to support her and help her process the issues. Client processed the lump in her chest that comes with feeling stuck, like she cant be productive in her writing due to feeling different and fragile. Client made progress in reducing her SUDs caused by this distressing issue. Therapist assessed client's safety and client denied SI/HI/AVH.   Interventions: Motivational Interviewing and RPT & BSP  Diagnosis:   ICD-10-CM   1. GAD (generalized anxiety disorder)  F41.1      Plan of Care: Client to return for weekly therapy with Sammuel Cooper, therapist, to review again in 6 months. Client is to continue seeing medication provider for support of mood management.   Client to engage in CBT: challenging negative internal ruminationsand self-talk AEB journaling daily or expressing thoughts to support persons in their life and  then challenging it with truth.  Client to engage in tapping to tap in healthy cognition that challenges negative rumination or deep negative core belief.  Client to practice DBT distress tolerance skills to decrease crying spells and thoughts of not being able to endure their suffering AEB using mindfulness, deep breathing, and TIP to increase tolerance for discomfort, discharge emotional distress, and increase their understanding that they can do hard things.  Client to utilize BSP (brainspotting) with therapist to help client identify and process triggers for their depressive episode and anxiety with goal of reducing said SUDs caused by depression/anxiety by 33% in the next 6 months. Client to prioritize sleep 8+ hours each week night AEB going to bed by 10pm each night.  Barnie Del, LCSW, LCAS, CCTP, CCS-I, BSP

## 2020-06-25 ENCOUNTER — Encounter: Payer: Self-pay | Admitting: Radiation Oncology

## 2020-07-01 ENCOUNTER — Ambulatory Visit: Payer: 59 | Admitting: Addiction (Substance Use Disorder)

## 2020-07-01 ENCOUNTER — Encounter: Payer: Self-pay | Admitting: Radiology

## 2020-07-01 ENCOUNTER — Encounter: Payer: Self-pay | Admitting: Oncology

## 2020-07-06 NOTE — Progress Notes (Incomplete)
  Patient Name: Michaela Roman MRN: [id number] DOB: 1970/12/26 Referring Physician: Lurline Del (Profile Not Attached) Date of Service: 06/03/2020 [facility], [place]                                                        End Of Treatment Note  Diagnoses: C50.412-Malignant neoplasm of upper-outer quadrant of left female breast  Cancer Staging: StagepT1c, pN1a, LeftBreast UOQ,Invasive DuctalCarcinoma, ER+/ PR+/ Her2-, Grade2  Intent: Curative  Radiation Treatment Dates: 04/17/2020 through 06/03/2020  Site: Left breast Technique: 3D Total dose (Gy): 50.4/50.4 Dose per Fx (Gy): 1.8 Completed Fx: 28/28 Beam Energies: 6X, 10X  Site: Left SCLV Technique: 3D Total dose (Gy): 50.4/50.4 Dose per Fx (Gy): 1.8 Completed Fx: 28/28 Beam Energies: 6X, 15X  Site: Left breast boost Technique: 3D Total dose (Gy): 12/12 Dose per Fx (Gy): 2 Completed Fx: 6/6 Beam Energies: 6X  Narrative: The patient tolerated radiation therapy relatively well. She did report right shoulder blade and muscle pain/tightness, sore throat after treatment, and soreness/swelling to the left breast/axilla area. She denied swelling to the left breast/arm/axilla, limitation in motion of the left arm, and fatigue. Appetite remained stable. On examination, she was noted to have mild erythema and hyperpigmentation changes to the left breast and supraclavicular area. No moist desquamation. There was also some erythema along the back near the tip of the scapula, but no significant skin breakdown. The oral cavity remained moist without secondary infection.  Plan: The patient will follow-up with radiation oncology in one month.  ________________________________________________   Blair Promise, PhD, MD  This document serves as a record of  services personally performed by Gery Pray, MD. It was created on his behalf by Clerance Lav, a trained medical scribe. The creation of this record is based on the scribe's personal observations and the provider's statements to them. This document has been checked and approved by the attending provider.
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
## 2020-07-07 ENCOUNTER — Encounter: Payer: Self-pay | Admitting: Radiation Oncology

## 2020-07-07 ENCOUNTER — Ambulatory Visit
Admission: RE | Admit: 2020-07-07 | Discharge: 2020-07-07 | Disposition: A | Discharge status: Home / Self Care | Payer: 59 | Source: Ambulatory Visit | Attending: Radiation Oncology | Admitting: Radiation Oncology

## 2020-07-07 VITALS — BP 106/70 | HR 69 | Temp 98.0°F | Resp 18 | Ht 63.0 in | Wt 154.2 lb

## 2020-07-07 DIAGNOSIS — Z170 Estrogen receptor positive status [ER+]: Secondary | ICD-10-CM

## 2020-07-07 HISTORY — DX: Personal history of irradiation: Z92.3
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
## 2020-07-08 ENCOUNTER — Other Ambulatory Visit: Payer: Self-pay

## 2020-07-08 ENCOUNTER — Ambulatory Visit (INDEPENDENT_AMBULATORY_CARE_PROVIDER_SITE_OTHER): Payer: 59 | Admitting: Addiction (Substance Use Disorder)

## 2020-07-08 DIAGNOSIS — F411 Generalized anxiety disorder: Secondary | ICD-10-CM | POA: Diagnosis not present
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
## 2020-07-15 ENCOUNTER — Ambulatory Visit: Payer: 59 | Admitting: Addiction (Substance Use Disorder)

## 2020-07-18 ENCOUNTER — Other Ambulatory Visit: Payer: Self-pay | Admitting: Obstetrics and Gynecology

## 2020-07-18 DIAGNOSIS — Z853 Personal history of malignant neoplasm of breast: Secondary | ICD-10-CM

## 2020-07-22 ENCOUNTER — Ambulatory Visit (INDEPENDENT_AMBULATORY_CARE_PROVIDER_SITE_OTHER): Payer: 59 | Admitting: Addiction (Substance Use Disorder)

## 2020-07-22 ENCOUNTER — Encounter: Payer: 59 | Attending: Physical Medicine & Rehabilitation | Admitting: Physical Medicine & Rehabilitation

## 2020-07-22 ENCOUNTER — Encounter: Payer: Self-pay | Admitting: Physical Medicine & Rehabilitation

## 2020-07-22 ENCOUNTER — Other Ambulatory Visit: Payer: Self-pay

## 2020-07-22 VITALS — BP 103/70 | HR 66 | Temp 98.5°F | Ht 63.0 in | Wt 154.2 lb

## 2020-07-22 DIAGNOSIS — G5603 Carpal tunnel syndrome, bilateral upper limbs: Secondary | ICD-10-CM | POA: Insufficient documentation

## 2020-07-22 DIAGNOSIS — F411 Generalized anxiety disorder: Secondary | ICD-10-CM | POA: Diagnosis not present

## 2020-07-22 NOTE — Progress Notes (Signed)
See media tab for full results

## 2020-07-22 NOTE — Progress Notes (Signed)
[facility] Counselor/Therapist Progress Note  Patient ID: Michaela Roman, MRN: [id number],    Date: 07/22/2020  Time Spent: 35mins  Treatment Type: Individual Therapy  Reported Symptoms: adjusting  Mental Status Exam:  Appearance:   Casual     Behavior:  Appropriate and Sharing  Motor:  Normal  Speech/Language:   Clear and Coherent and Normal Rate  Affect:  Appropriate and Congruent  Mood:  normal   Thought process:  normal  Thought content:    Rumination  Sensory/Perceptual disturbances:    WNL  Orientation:  x4  Attention:  Good  Concentration:  Good  Memory:  WNL  Fund of knowledge:   Good  Insight:    Fair  Judgment:   Good  Impulse Control:  Good   Risk Assessment: Danger to Self:  No. Denied.  Self-injurious Behavior: No Danger to Others: No Duty to Warn:no Physical Aggression / Violence:No  Access to Firearms a concern: No  Gang Involvement:No   Virtual Visit via VIDEO: I connected with client by MyChart video enabled telemedicine/telehealth application, with their informed consent, and verified client privacy and that I am speaking with the correct person using two identifiers. I discussed the limitations, risks, security and privacy concerns of performing psychotherapy and management service virtually and confirmed their location. I also discussed with the patient that there may be a patient responsible charge related to this service and to confirm with the front desk if their insurance covers teletherapy. I also discussed with the patient the availability of in person appointments. The patient expressed understanding and agreed to proceed. I discussed the treatment planning with the client. The client was provided an opportunity to ask questions and all were answered. The client agreed with the plan and demonstrated an understanding of the instructions. The client was advised to call our office if symptoms worsen or feel they are in a crisis state and  need immediate contact. Client also reminded of a crisis line number and to use 9-1-1 if there's an emergency.  Therapist Location: office; Client Location: home.  Subjective: Client reported feeling embarrassed about having to not stay up late, not hot tub, and not cuddle her husband when shes night sweating. Client frustrated and sad that she has to do different things and even miss out on things as she protects her health. Therapist used MI & CBT with client to support and help her process ways to think of things that empower her, not steal her joy. 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 Client allowed herself to get curious and get in her own skin, which is progress since not feeling great in her skin since the cancer dx. Therapist assessed client's safety and client denied SI/HI/AVH.   Interventions: Cognitive Behavioral Therapy and Motivational Interviewing  Diagnosis:   ICD-10-CM   1. GAD (generalized anxiety disorder)  F41.1      Plan of Care: Client to return for weekly therapy with Sammuel Cooper, therapist, to review again in 6 months. Client is to continue seeing medication provider for support of mood management.   Client to engage in CBT: challenging negative internal ruminationsand self-talk AEB journaling daily or expressing thoughts to support persons in their life and then challenging it with truth.  Client to engage in tapping  to tap in healthy cognition that challenges negative rumination or deep negative core belief.  Client to practice DBT distress tolerance skills to decrease crying spells and thoughts of not being able to endure their suffering AEB using mindfulness, deep breathing, and TIP to increase tolerance for discomfort, discharge emotional distress,  and increase their understanding that they can do hard things.  Client to utilize BSP (brainspotting) with therapist to help client identify and process triggers for their depressive episode and anxiety with goal of reducing said SUDs caused by depression/anxiety by 33% in the next 6 months. Client to prioritize sleep 8+ hours each week night AEB going to bed by 10pm each night.  Barnie Del, LCSW, LCAS, CCTP, CCS-I, BSP

## 2020-08-11 ENCOUNTER — Other Ambulatory Visit: Payer: Self-pay | Admitting: Obstetrics and Gynecology

## 2020-08-12 NOTE — Telephone Encounter (Signed)
Annual exam scheduled on 10/23/20

## 2020-08-15 ENCOUNTER — Other Ambulatory Visit: Payer: Self-pay | Admitting: Obstetrics and Gynecology

## 2020-08-15 DIAGNOSIS — E28319 Asymptomatic premature menopause: Secondary | ICD-10-CM

## 2020-08-20 NOTE — Progress Notes (Signed)
[facility]  [phone]) [phone] Fax:(336) [phone]     ID: Michaela Roman DOB: September 07, 1970  MR#: [id number]  BMW#:[id number]  Patient Care Team: Glenis Smoker, MD as PCP - General (Family Medicine) Mauro Kaufmann, RN as Oncology Nurse Navigator Rockwell Germany, RN as Oncology Nurse Navigator Erroll Luna, MD as Consulting Physician (General Surgery) Prince Olivier, Virgie Dad, MD as Consulting Physician (Oncology) Gery Pray, MD as Consulting Physician (Radiation Oncology) Nunzio Cobbs, MD as Consulting Physician (Obstetrics and Gynecology) Chauncey Cruel, MD OTHER MD:  I connected with Lupita Raider Hopf on 08/21/20 at  9:00 AM EDT by video enabled telemedicine visit and verified that I am speaking with the correct person using two identifiers.   I discussed the limitations, risks, security and privacy concerns of performing an evaluation and management service by telemedicine and the availability of in-person appointments. I also discussed with the patient that there may be a patient responsible charge related to this service. The patient expressed understanding and agreed to proceed.   Other persons participating in the visit and their role in the encounter: None  Patient's location: Home Provider's location: [place]  Total time spent: 25 min   CHIEF COMPLAINT: Estrogen receptor positive breast cancer  CURRENT TREATMENT: tamoxifen   INTERVAL HISTORY: Michaela Roman was contacted today for follow up of her estrogen receptor positive breast cancer.   Since her last visit, she completed radiation therapy on 06/03/2020.  She started tamoxifen on 06/29/2020.  She is tolerating this moderately well.  She does have some hot flashes.  She is scheduled for routine mammography on 08/29/2020.   REVIEW OF SYSTEMS: Michaela Roman went off her Lexapro and wonders if that was a good idea.  She has felt more depressed and irritable.   She is very concerned of course about her diagnosis although she understands her prognosis is good.  A detailed review of systems was otherwise stable   COVID 19 VACCINATION STATUS: Status post Pfizer x2 with booster October 2021   HISTORY OF CURRENT ILLNESS: From the original intake note:  Michaela Roman herself palpated a left breast lump. Of note, annual screening mammogram from 08/27/2019 was negative. She underwent left diagnostic mammography with tomography and left breast ultrasonography at [facility] on 01/23/2020 showing: breast density category B; palpable 1.3 cm mass in left breast at 2 o'clock that extends to skin; one borderline abnormal left axillary lymph node.  Accordingly on 02/04/2020 she proceeded to biopsy of the left breast area in question. The pathology from this procedure ([id number]) showed: invasive ductal carcinoma, grade 2. Prognostic indicators significant for: estrogen receptor, 80% positive and progesterone receptor, 80% positive, both with moderate-strong staining intensity. Proliferation marker Ki67 at 30%. HER2 negative by immunohistochemistry (0).  The biopsied lymph node was positive for metastatic carcinoma.  The patient's subsequent history is as detailed below.   PAST MEDICAL HISTORY: Past Medical History:  Diagnosis Date   Anxiety    Arthritis    OP   Breast cancer ([place])    Depression    Family history of breast cancer 02/13/2020   History of radiation therapy 04/17/20-06/03/20   IMRT to Left breast Dr. 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   Premature ovarian failure    Dx'd age 8   Thyroid disease    hypothyroid    PAST SURGICAL HISTORY: Past Surgical History:  Procedure  Laterality Date   BREAST LUMPECTOMY WITH RADIOACTIVE SEED AND SENTINEL LYMPH NODE BIOPSY Left 03/12/2020   Procedure: LEFT BREAST LUMPECTOMY WITH RADIOACTIVE SEED AND LEFT TARGETED RADIOACTIVE SEED LYMPH NODE  BIOPSY AND LEFT SENTINEL LYMPH NODE MAPPING;  Surgeon: Erroll Luna, MD;  Location: [facility];  Service: General;  Laterality: Left;   DILATATION & CURETTAGE/HYSTEROSCOPY WITH MYOSURE N/A 10/07/2015   Procedure: DILATATION & CURETTAGE/HYSTEROSCOPY WITH MYOSURE;  Surgeon: Nunzio Cobbs, MD;  Location: [facility];  Service: Gynecology;  Laterality: N/A;  endometrial polyp   WISDOM TOOTH EXTRACTION      FAMILY HISTORY: Family History  Problem Relation Age of Onset   Thyroid disease Sister        hypothyroidism   Hypertension Maternal Grandmother    Heart disease Maternal Grandmother    Depression Maternal Grandmother    Diabetes Maternal Grandfather    Other Maternal Grandfather        benign brain tumor   Depression Mother    Multiple sclerosis Father    Dementia Paternal Grandmother    Alcohol abuse Paternal Grandfather    Breast cancer Other        MGF's niece; dx 74s   Colon cancer Other        MGF's brother; dx 109s   Prostate cancer Cousin 34       maternal cousin   Her father is age 18 and her mother age 43, as of 01/2020. Michaela Roman has one brother and one sister. She reports breast cancer in her mother's paternal first cousin in her 46's. She also notes colon cancer in her mother's paternal uncle in his 67's.   GYNECOLOGIC HISTORY:  Patient's last menstrual period was 08/07/2015 (approximate). Menarche: 50 years old [place] P 0 LMP 2015 Contraceptive: used from age [age] HRT used for 11 years, stopped with cancer diagnosis  Hysterectomy? no BSO? 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 Husband Michaela Roman is a Doctor, hospital, as well as the Chief of Staff [employer] here in [place]. She lives at home with Letitia Caul, no pets.  The patient is a Buddhist.    ADVANCED DIRECTIVES: in place   HEALTH MAINTENANCE: Social History   Tobacco Use   Smoking status: Never    Smokeless tobacco: Never  Vaping Use   Vaping Use: Never used  Substance Use Topics   Alcohol use: Yes    Alcohol/week: 2.0 standard drinks    Types: 2 Glasses of wine per week    Comment: one drink a week   Drug use: No     Colonoscopy: n/a (age)  PAP: 09/2019  Bone density: 11/2018, -1.5   Allergies  Allergen Reactions   Sertraline Hives    Current Outpatient Medications  Medication Sig Dispense Refill   gabapentin (NEURONTIN) 300 MG capsule Take 1 capsule (300 mg total) by mouth at bedtime. 90 capsule 4   venlafaxine (EFFEXOR) 37.5 MG tablet Take 1 tablet (37.5 mg total) by mouth 2 (two) times daily. 90 tablet 4   calcium gluconate 500 MG tablet Take 1,000 tablets by mouth daily.     levothyroxine (SYNTHROID) 112 MCG tablet Take 1 tablet by mouth daily before breakfast. 90 tablet 0   Multiple Vitamins-Minerals (MULTIVITAMIN PO) Take 1 tablet by mouth daily.     tamoxifen (NOLVADEX) 20 MG tablet Take 1 tablet (20 mg total) by mouth daily. 30 tablet 6  triamcinolone (KENALOG) 0.1 % Apply topically 2 (two) times daily as needed.     No current facility-administered medications for this visit.    OBJECTIVE: White woman who appears well  There were no vitals filed for this visit.    There is no height or weight on file to calculate BMI.   Wt Readings from Last 3 Encounters:  07/22/20 154 lb 3.2 oz (69.9 kg)  07/07/20 154 lb 3.2 oz (69.9 kg)  04/24/20 154 lb 7 oz (70.1 kg)      ECOG FS:0 - Asymptomatic  Telemedicine visit 08/21/2020    LAB RESULTS:  CMP     Component Value Date/Time   NA 141 03/07/2020 1050   NA 140 10/17/2018 0922   K 4.4 03/07/2020 1050   CL 102 03/07/2020 1050   CO2 30 03/07/2020 1050   GLUCOSE 99 03/07/2020 1050   BUN 15 03/07/2020 1050   BUN 12 10/17/2018 0922   CREATININE 0.91 03/07/2020 1050   CREATININE 1.03 (H) 02/13/2020 0821   CREATININE 0.95 08/22/2015 1653   CALCIUM 9.3 03/07/2020 1050   PROT 6.8 03/07/2020 1050   PROT  6.5 10/17/2018 0922   ALBUMIN 4.2 03/07/2020 1050   ALBUMIN 4.5 10/17/2018 0922   AST 26 03/07/2020 1050   AST 20 02/13/2020 0821   ALT 24 03/07/2020 1050   ALT 21 02/13/2020 0821   ALKPHOS 41 03/07/2020 1050   BILITOT 0.6 03/07/2020 1050   BILITOT 0.8 02/13/2020 0821   GFRNONAA >60 03/07/2020 1050   GFRNONAA >60 02/13/2020 0821   GFRAA 81 10/17/2018 0922    No results found for: TOTALPROTELP, ALBUMINELP, A1GS, A2GS, BETS, BETA2SER, GAMS, MSPIKE, SPEI  Lab Results  Component Value Date   WBC 5.3 03/07/2020   NEUTROABS 3.2 03/07/2020   HGB 14.2 03/07/2020   HCT 43.3 03/07/2020   MCV 93.7 03/07/2020   PLT 183 03/07/2020    No results found for: LABCA2  No components found for: EEFEOF121  No results for input(s): INR in the last 168 hours.  No results found for: LABCA2  No results found for: FXJ883  No results found for: GPQ982  No results found for: MEB583  No results found for: CA2729  No components found for: HGQUANT  No results found for: CEA1 / No results found for: CEA1   No results found for: AFPTUMOR  No results found for: CHROMOGRNA  No results found for: KPAFRELGTCHN, LAMBDASER, KAPLAMBRATIO (kappa/lambda light chains)  No results found for: HGBA, HGBA2QUANT, HGBFQUANT, HGBSQUAN (Hemoglobinopathy evaluation)   No results found for: LDH  No results found for: IRON, TIBC, IRONPCTSAT (Iron and TIBC)  No results found for: FERRITIN  Urinalysis    Component Value Date/Time   BILIRUBINUR n 08/22/2015 1436   PROTEINUR n 08/22/2015 1436   UROBILINOGEN negative 08/22/2015 1436   NITRITE n 08/22/2015 1436   LEUKOCYTESUR Negative 08/22/2015 1436    STUDIES: No results found.   ELIGIBLE FOR AVAILABLE RESEARCH PROTOCOL: AET  ASSESSMENT: 50 y.o. [place] woman status post left breast and left axillary lymph node biopsy 02/04/2020 for a clinical T1c N1, stage IB invasive ductal carcinoma, grade 2, estrogen and progesterone receptor positive,  HER-2 not amplified, with an MIB-1 of 30%   (1) status post left lumpectomy and targeted axillary lymph node dissection 03/12/2020 for a pT1c pNa, stage IB invasive ductal carcinoma, grade 2, with negative margins  (a) a total of 4 left axillary lymph nodes removed, 1+  (2) MammaPrint to be obtained from  the left breast biopsy sample showed a low risk, luminal a type breast cancer, predicting a 96% 5-year metastasis free survival with hormone therapy alone and no significant chemotherapy benefit  (3) genetics testing 02/26/2020 showed a 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   (b) The CancerNext-Expanded gene panel offered by [employer] found no additional mutations in AIP, ALK, APC, ATM, AXIN2, BAP1, BARD1, BLM, BMPR1A, BRCA1, BRCA2, BRIP1, CDC73, CDH1, CDK4, CDKN1B, CDKN2A, CHEK2, CTNNA1, DICER1, FANCC, FH, FLCN, GALNT12, KIF1B, LZTR1, MAX, MEN1, MET, MLH1, MSH2, MSH3, MSH6, MUTYH, NBN, NF1, NF2, NTHL1, PALB2, PHOX2B, PMS2, POT1, PRKAR1A, PTCH1, PTEN, RAD51C, RAD51D, RB1, RECQL, RET, SDHA, SDHAF2, SDHB, SDHC, SDHD, SMAD4, SMARCA4, SMARCB1, SMARCE1, STK11, SUFU, TMEM127, TP53, TSC1, TSC2, VHL and XRCC2 (sequencing and deletion/duplication); EGFR, EGLN1, HOXB13, KIT, MITF, PDGFRA, POLD1, and POLE (sequencing only); EPCAM and GREM1 (deletion/duplication only)  (4) adjuvant radiation 04/17/2020 through 06/03/2020 Site Technique Total Dose (Gy) Dose per Fx (Gy) Completed Fx Beam Energies  Breast, Left: Breast_Lt 3D 50.4/50.4 1.8 28/28 6X, 10X  Breast, Left: Breast_Lt_SCLV 3D 50.4/50.4 1.8 28/28 6X, 15X  Breast, Left: Breast_Lt_Bst 3D 12/12 2 6/6 6X   (5) tamoxifen started 06/29/2020  (a) most recent bone scan shows osteopenia   PLAN: Rula did very well with her local treatment and is tolerating her tamoxifen generally well.  She also notes that  she overall has a good prognosis.  Despite this emotionally this is a very hard time for her.  It is not just that she went off the Lexapro although that probably is part of it as well.  She is realizing that she had cancer, may have cancer but again, and basically she is vulnerable.  This is a form of PTSD that many of my patients do experience.  We discussed all that at length.  I am starting her on venlafaxine 37.5 mg XR and we will have another virtual visit in about 2 weeks to see how she is dealing with that.  Very likely we will increase the venlafaxine to 75 mg at that point.  We will then set her up for follow-up mammography and in person visit.   Virgie Dad. Geoffrey Mankin, MD 08/21/2020 9:27 AM Medical Oncology and Hematology [facility] [place], [place] [address] Tel. [phone]    Fax. [phone]   This document serves as a record of services personally performed by Lurline Del, MD. It was created on his behalf by Wilburn Mylar, a trained medical scribe. The creation of this record is based on the scribe's personal observations and the provider's statements to them.   I, Lurline Del MD, have reviewed the above documentation for accuracy and completeness, and I agree with the above.   *Total Encounter Time as defined by the Centers for Medicare and Medicaid Services includes, in addition to the face-to-face time of a patient visit (documented in the note above) non-face-to-face time: obtaining and reviewing outside history, ordering and reviewing medications, tests or procedures, care coordination (communications with other health care professionals or caregivers) and documentation in the medical record.

## 2020-08-21 ENCOUNTER — Telehealth (HOSPITAL_BASED_OUTPATIENT_CLINIC_OR_DEPARTMENT_OTHER): Payer: 59 | Admitting: Oncology

## 2020-08-21 DIAGNOSIS — Z170 Estrogen receptor positive status [ER+]: Secondary | ICD-10-CM

## 2020-08-21 DIAGNOSIS — C50412 Malignant neoplasm of upper-outer quadrant of left female breast: Secondary | ICD-10-CM

## 2020-08-21 MED ORDER — GABAPENTIN 300 MG PO CAPS: 300.0000 mg | ORAL_CAPSULE | Freq: Every day | ORAL | 4 refills | Status: DC

## 2020-08-21 MED ORDER — VENLAFAXINE HCL 37.5 MG PO TABS: 37.5000 mg | ORAL_TABLET | Freq: Two times a day (BID) | ORAL | 4 refills | Status: DC

## 2020-08-26 ENCOUNTER — Ambulatory Visit (INDEPENDENT_AMBULATORY_CARE_PROVIDER_SITE_OTHER): Payer: 59 | Admitting: Psychiatry

## 2020-08-26 ENCOUNTER — Encounter: Payer: Self-pay | Admitting: Psychiatry

## 2020-08-26 ENCOUNTER — Other Ambulatory Visit: Payer: Self-pay

## 2020-08-26 ENCOUNTER — Ambulatory Visit (INDEPENDENT_AMBULATORY_CARE_PROVIDER_SITE_OTHER): Payer: 59 | Admitting: Addiction (Substance Use Disorder)

## 2020-08-26 DIAGNOSIS — F330 Major depressive disorder, recurrent, mild: Secondary | ICD-10-CM | POA: Diagnosis not present

## 2020-08-26 DIAGNOSIS — F411 Generalized anxiety disorder: Secondary | ICD-10-CM

## 2020-08-26 DIAGNOSIS — F4311 Post-traumatic stress disorder, acute: Secondary | ICD-10-CM | POA: Diagnosis not present

## 2020-08-26 MED ORDER — GABAPENTIN 300 MG PO CAPS: ORAL_CAPSULE | ORAL | 4 refills | Status: DC

## 2020-08-26 MED ORDER — VENLAFAXINE HCL ER 75 MG PO CP24: 75.0000 mg | ORAL_CAPSULE | Freq: Every day | ORAL | 0 refills | Status: DC

## 2020-08-26 NOTE — Progress Notes (Signed)
Michaela Roman [id number] 1970-08-08 50 y.o.  Subjective:   Patient ID:  Michaela Roman is a 50 y.o. (DOB 1970-09-08) female.  Chief Complaint:  Chief Complaint  Patient presents with   Anxiety   Depression    HPI Michaela Roman presents to the office today for follow-up of anxiety and depression. She reports that she has had insomnia for the last [redacted] weeks along with anxiety and irritability. Stopped Lexapro on 6/12. She reports that she falls asleep without difficulty and awakens from 2 am-5 or 6 am. She reports that she was having racing thoughts at night.  She reports that sleep was ok off Klonopin and worsened when she tapered off Lexapro. She reports that she went to urgent care last week and they prescribed Trazodone. Had apt Thursday with oncologist and discussed that she has PTSD. Oncologist recommended Gabapentin and Effexor. She reports that she started both of these meds Friday and she was able to sleep through the night for the first time on Friday night. She reports that she has slept well most nights except for the last 2 nights. . Has some anxiety about diagnostic mammogram. She reports that she is less anxious this week. Denies physical s/s of anxiety. She reports improved mood. She reports occasional intrusive memories. Denies flashbacks. She reports nightmares were occurring daily before this combination of medication. She reports that she has noticed exaggerated startle response. Appetite has been good. Energy and motivation have improved over the weekend. Concentration has been impaired. She reports that she is easily distracted. Denies anhedonia. She reports that she was puling away from family and friends. She reports that she has started opening up with friends and enjoyed time with friends last night. Looking forward to dinner with friends on Thursday. Denies SI.   Past Psychiatric Medication Trials: Wellbutrin- Insomnia, racing thoughts Stopped after 2  weeks. Zoloft- Took for a few weeks. Had insomnia. Developed hives.  Lexapro Lorazepam- Had a calming effect. Not effective with decreasing middle of the night awakenings. Melatonin- Vivid dreams Trazodone- Felt sedated but did not sleep.   [facility] Office Visit from 07/22/2020 in [facility]  PHQ-2 Total Score 1  PHQ-9 Total Score 3        Review of Systems:  Review of Systems  Gastrointestinal:  Positive for constipation.  Musculoskeletal:  Negative for gait problem.  Psychiatric/Behavioral:         Please refer to HPI   Medications: I have reviewed the patient's current medications.  Current Outpatient Medications  Medication Sig Dispense Refill   calcium gluconate 500 MG tablet Take 1,000 tablets by mouth daily.     levothyroxine (SYNTHROID) 112 MCG tablet Take 1 tablet by mouth daily before breakfast. 90 tablet 0   Multiple Vitamins-Minerals (MULTIVITAMIN PO) Take 1 tablet by mouth daily.     tamoxifen (NOLVADEX) 20 MG tablet Take 1 tablet (20 mg total) by mouth daily. 30 tablet 6   venlafaxine (EFFEXOR) 37.5 MG tablet Take 1 tablet (37.5 mg total) by mouth 2 (two) times daily. 90 tablet 4   venlafaxine XR (EFFEXOR XR) 75 MG 24 hr capsule Take 1 capsule (75 mg total) by mouth daily with breakfast. 30 capsule 0   gabapentin (NEURONTIN) 300 MG capsule Take 1-2 capsules at bedtime 90 capsule 4   triamcinolone (KENALOG) 0.1 % Apply topically 2 (two) times daily as needed. (Patient not taking: Reported on 08/26/2020)     No  current facility-administered medications for this visit.    Medication Side Effects: Other: Constipation   Dry mouth  Allergies:  Allergies  Allergen Reactions   Sertraline Hives    Past Medical History:  Diagnosis Date   Anxiety    Arthritis    OP   Breast cancer ([place])    Depression    Family history of breast cancer 02/13/2020   History of radiation therapy 04/17/20-06/03/20   IMRT to Left  breast Dr. 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   Please see review of systems for further details on the patient's review from today.   Objective:   Physical Exam:  LMP 08/07/2015 (Approximate)   Physical Exam Constitutional:      General: She is not in acute distress. Musculoskeletal:        General: No deformity.  Neurological:     Mental Status: She is alert and oriented to person, place, and time.     Coordination: Coordination normal.  Psychiatric:        Attention and Perception: Attention and perception normal. She does not perceive auditory or visual hallucinations.        Mood and Affect: Mood is anxious. Affect is not labile, blunt, angry or inappropriate.        Speech: Speech normal.        Behavior: Behavior normal.        Thought Content: Thought content normal. Thought content is not paranoid or delusional. Thought content does not include homicidal or suicidal ideation. Thought content does not include homicidal or suicidal plan.        Cognition and Memory: Cognition and memory normal.        Judgment: Judgment normal.     Comments: Insight intact Dysphoric mood    Lab Review:     Component Value Date/Time   NA 141 03/07/2020 1050   NA 140 10/17/2018 0922   K 4.4 03/07/2020 1050   CL 102 03/07/2020 1050   CO2 30 03/07/2020 1050   GLUCOSE 99 03/07/2020 1050   BUN 15 03/07/2020 1050   BUN 12 10/17/2018 0922   CREATININE 0.91 03/07/2020 1050   CREATININE 1.03 (H) 02/13/2020 0821   CREATININE 0.95 08/22/2015 1653   CALCIUM 9.3 03/07/2020 1050   PROT 6.8 03/07/2020 1050   PROT 6.5 10/17/2018 0922   ALBUMIN 4.2 03/07/2020 1050   ALBUMIN 4.5 10/17/2018 0922   AST 26 03/07/2020 1050   AST 20 02/13/2020 0821    ALT 24 03/07/2020 1050   ALT 21 02/13/2020 0821   ALKPHOS 41 03/07/2020 1050   BILITOT 0.6 03/07/2020 1050   BILITOT 0.8 02/13/2020 0821   GFRNONAA >60 03/07/2020 1050   GFRNONAA >60 02/13/2020 0821   GFRAA 81 10/17/2018 0922       Component Value Date/Time   WBC 5.3 03/07/2020 1050   RBC 4.62 03/07/2020 1050   HGB 14.2 03/07/2020 1050   HGB 14.3 02/13/2020 0821   HGB 13.5 10/17/2018 0922   HCT 43.3 03/07/2020 1050   HCT 40.9 10/17/2018 0922   PLT 183 03/07/2020 1050   PLT 231 02/13/2020 0821   PLT 188 10/17/2018 0922   MCV 93.7 03/07/2020 1050  MCV 93 10/17/2018 0922   MCH 30.7 03/07/2020 1050   MCHC 32.8 03/07/2020 1050   RDW 12.0 03/07/2020 1050   RDW 13.1 10/17/2018 0922   LYMPHSABS 1.6 03/07/2020 1050   MONOABS 0.4 03/07/2020 1050   EOSABS 0.1 03/07/2020 1050   BASOSABS 0.0 03/07/2020 1050    No results found for: POCLITH, LITHIUM   No results found for: PHENYTOIN, PHENOBARB, VALPROATE, CBMZ   .res Assessment: Plan:    Patient seen for 30 minutes and time spent reviewing recent changes to medication and discussing treatment plan.  Discussed continuing Effexor since she has noticed some improved mood and anxiety since switching to Effexor and gabapentin.  Discussed that Effexor could be changed back to Lexapro if she experiences any tolerability issues.  Discussed changing Effexor to Effexor XR 75 mg daily when current supply is completed since XR version typically is associated with less side effects.  Discussed that she may also wish to take second dose of venlafaxine somewhat earlier to minimize risk of sleep disturbance. Discussed option of increasing gabapentin to 600 mg at bedtime as tolerated.  Discussed that gabapentin is used off label for anxiety and insomnia. Recommend continuing psychotherapy with Sammuel Cooper, LCSW. Patient to follow-up with this provider in 6 weeks or sooner if clinically indicated. Patient advised to contact office with any questions,  adverse effects, or acute worsening in signs and symptoms.   Michaela Roman was seen today for anxiety and depression.  Diagnoses and all orders for this visit:  GAD (generalized anxiety disorder) -     gabapentin (NEURONTIN) 300 MG capsule; Take 1-2 capsules at bedtime -     venlafaxine XR (EFFEXOR XR) 75 MG 24 hr capsule; Take 1 capsule (75 mg total) by mouth daily with breakfast.  Mild episode of recurrent major depressive disorder (HCC) -     venlafaxine XR (EFFEXOR XR) 75 MG 24 hr capsule; Take 1 capsule (75 mg total) by mouth daily with breakfast.    Please see After Visit Summary for patient specific instructions.  Future Appointments  Date Time Provider [place]  08/29/2020 10:20 AM [facility] DIAG [facility] 1 [facility]-BREAST [facility]  09/02/2020  9:00 AM Barnie Del, LCSW [facility]-CP None  09/08/2020  3:30 PM Suanne Marker, PTA [facility] None  09/09/2020  9:00 AM Barnie Del, LCSW CP-CP None  09/16/2020  9:00 AM Barnie Del, LCSW CP-CP None  09/23/2020  9:00 AM Barnie Del, LCSW CP-CP None  10/08/2020  1:45 PM Thayer Headings, PMHNP CP-CP None  10/08/2020  3:00 PM Barnie Del, LCSW CP-CP None  10/23/2020  9:00 AM Barnie Del, LCSW CP-CP None  10/23/2020  3:30 PM Nunzio Cobbs, MD [facility] None  10/28/2020 11:00 AM Barnie Del, LCSW CP-CP None  11/04/2020  9:00 AM Barnie Del, LCSW CP-CP None    No orders of the defined types were placed in this encounter.   -------------------------------

## 2020-08-26 NOTE — Progress Notes (Signed)
      [facility] Counselor/Therapist Progress Note  Patient ID: Michaela Roman, MRN: [id number],    Date: 08/26/2020  Time Spent: 35mins  Treatment Type: Individual Therapy  Reported Symptoms: stressed/ discouraged.   Mental Status Exam:  Appearance:   Casual     Behavior:  Appropriate and Sharing  Motor:  Normal  Speech/Language:   Clear and Coherent and Normal Rate  Affect:  Appropriate and Congruent  Mood:  anxious, irritable, labile, and sad   Thought process:  normal  Thought content:    Rumination  Sensory/Perceptual disturbances:    WNL  Orientation:  x4  Attention:  Good  Concentration:  Good  Memory:  WNL  Fund of knowledge:   Good  Insight:    Fair  Judgment:   Good  Impulse Control:  Good   Risk Assessment: Danger to Self:  No. Denied.  Self-injurious Behavior: No Danger to Others: No Duty to Warn:no Physical Aggression / Violence:No  Access to Firearms a concern: No  Gang Involvement:No    Subjective: Client reported having a "horrible last 3 weeks". Client reported having to reassure all her family that she is okay/ will be cancer free and yet feeling unsure and anxious inside and still grieving the changes in her body due to her cancer experience. Client reported things coming to head when she was playing a card game that she got a bad card hand. Client reported feeling irritable and anxious and it welling up inside her until she broke out in tears, realizing she had 2 thoughts swirling in her head ie: "Everything is different" and "nothing ever changes". Therapist used MI & CBT and BSP with client to support her as she processed the tightening in her throat and 3rd eye,challenging irrational thoughts. Client also processed having insomnia and being upset she came off her [place] & sleep meds, reporting some Acute stress symptoms such as racing thoughts and hypervigilence all night. Therapist validated client's struggle and encouraged the discussion of meds  with her NP. Therapist also assessed client's safety and client denied SI/HI/AVH.   Interventions: Cognitive Behavioral Therapy, Motivational Interviewing, and BSP  Diagnosis:   ICD-10-CM   1. GAD (generalized anxiety disorder)  F41.1     2. Acute posttraumatic stress disorder  F43.11      Plan of Care: Client to return for weekly therapy with Sammuel Cooper, therapist, to review again in 6 months.  Client is to continue seeing medication provider for support of mood management.   Client to engage in CBT: challenging negative internal ruminations and self-talk AEB journaling daily or expressing thoughts to support persons in their life and then challenging it with truth.  Client to engage in tapping to tap in healthy cognition that challenges negative rumination or deep negative core belief.  Client to practice DBT distress tolerance skills to decrease crying spells and thoughts of not being able to endure their suffering AEB using mindfulness, deep breathing, and TIP to increase tolerance for discomfort, discharge emotional distress, and increase their understanding that they can do hard things.  Client to utilize BSP (brainspotting) with therapist to help client identify and process triggers for their depressive episode and anxiety with goal of reducing said SUDs caused by depression/anxiety by 33% in the next 6 months.  Client to prioritize sleep 8+ hours each week night AEB going to bed by 10pm each night.   Barnie Del, LCSW, LCAS, CCTP, CCS-I, BSP

## 2020-08-29 ENCOUNTER — Other Ambulatory Visit: Payer: Self-pay

## 2020-08-29 ENCOUNTER — Ambulatory Visit
Admission: RE | Admit: 2020-08-29 | Discharge: 2020-08-29 | Disposition: A | Discharge status: Home / Self Care | Payer: 59 | Source: Ambulatory Visit | Attending: Obstetrics and Gynecology | Admitting: Obstetrics and Gynecology

## 2020-08-29 DIAGNOSIS — Z853 Personal history of malignant neoplasm of breast: Secondary | ICD-10-CM

## 2020-08-29 HISTORY — DX: Personal history of irradiation: Z92.3

## 2020-09-02 ENCOUNTER — Ambulatory Visit (INDEPENDENT_AMBULATORY_CARE_PROVIDER_SITE_OTHER): Payer: 59 | Admitting: Addiction (Substance Use Disorder)

## 2020-09-02 ENCOUNTER — Other Ambulatory Visit: Payer: Self-pay

## 2020-09-02 DIAGNOSIS — F411 Generalized anxiety disorder: Secondary | ICD-10-CM

## 2020-09-02 NOTE — Progress Notes (Signed)
      [facility] Counselor/Therapist Progress Note  Patient ID: Michaela Roman, MRN: [id number],    Date: 09/02/2020  Time Spent: 76mins  Treatment Type: Individual Therapy  Reported Symptoms: anxious, sluggish, conflicted thinking  Mental Status Exam:  Appearance:   Casual     Behavior:  Appropriate and Sharing  Motor:  Normal  Speech/Language:   Clear and Coherent and Normal Rate  Affect:  Appropriate and Congruent  Mood:  anxious and irritable   Thought process:  circumstantial and flight of ideas  Thought content:    Obsessions and Rumination  Sensory/Perceptual disturbances:    WNL  Orientation:  x4  Attention:  Good  Concentration:  Good  Memory:  WNL  Fund of knowledge:   Good  Insight:    Fair  Judgment:   Good  Impulse Control:  Good   Risk Assessment: Danger to Self:  No. Denied.  Self-injurious Behavior: No Danger to Others: No Duty to Warn:no Physical Aggression / Violence:No  Access to Firearms a concern: No  Gang Involvement:No    Subjective: Client reported reported feeling very sluggish & conflicted. Client also reported having a lot of resistance around "doing"; client wants to just "be" and maybe get more rest. Client processed her question about whether she needs more rest or more ability to do things she likes on her own time. Client reported negative self talk and sabotaging herself. Therapist used MI & CBT with client to support her and challenge irrational thoughts. Client brainspotted the conflict between her thinking. One  body spot in her solar plexus is clenching and says: I dont want to do this." The other brain spot is a tingly sensation around her third eye. Client made progress identifying her fear of "looking stupid and failing in front of everybody" and "being a nobody". Client processed through just feeling like "shes just the wife" and her body softening, sharing she has begun to have more peace about her thoughts about where she is  in life. Therapist validated client's struggle and encouraged the discussion of meds with her NP. Therapist also assessed client's safety and client denied SI/HI/AVH.   Interventions: Cognitive Behavioral Therapy, Motivational Interviewing, and BSP  Diagnosis:   ICD-10-CM   1. GAD (generalized anxiety disorder)  F41.1       Plan of Care: Client to return for weekly therapy with Sammuel Cooper, therapist, to review again in 6 months.  Client is to continue seeing medication provider for support of mood management.   Client to engage in CBT: challenging negative internal ruminations and self-talk AEB journaling daily or expressing thoughts to support persons in their life and then challenging it with truth.  Client to engage in tapping to tap in healthy cognition that challenges negative rumination or deep negative core belief.  Client to practice DBT distress tolerance skills to decrease crying spells and thoughts of not being able to endure their suffering AEB using mindfulness, deep breathing, and TIP to increase tolerance for discomfort, discharge emotional distress, and increase their understanding that they can do hard things.  Client to utilize BSP (brainspotting) with therapist to help client identify and process triggers for their depressive episode and anxiety with goal of reducing said SUDs caused by depression/anxiety by 33% in the next 6 months.  Client to prioritize sleep 8+ hours each week night AEB going to bed by 10pm each night.   Barnie Del, LCSW, LCAS, CCTP, CCS-I, BSP

## 2020-09-08 ENCOUNTER — Other Ambulatory Visit: Payer: Self-pay

## 2020-09-08 ENCOUNTER — Ambulatory Visit: Payer: 59 | Attending: Surgery

## 2020-09-08 ENCOUNTER — Other Ambulatory Visit: Payer: Self-pay | Admitting: Obstetrics and Gynecology

## 2020-09-08 DIAGNOSIS — Z483 Aftercare following surgery for neoplasm: Secondary | ICD-10-CM | POA: Insufficient documentation

## 2020-09-08 NOTE — Therapy (Signed)
[place], [place], [address] Phone: [phone]   Fax:  [phone]  Physical Therapy Treatment  Patient Details  Name: Michaela Roman MRN: [id number] Date of Birth: [birth year] Referring Provider (PT): Dr. Erroll Luna   Encounter Date: 09/08/2020   PT End of Session - 09/08/20 1538     Visit Number 10   # unchanged due to screen only   PT Start Time 1532    PT Stop Time 1538    PT Time Calculation (min) 6 min    Activity Tolerance Patient tolerated treatment well    Behavior During Therapy [facility] for tasks assessed/performed             Past Medical History:  Diagnosis Date   Anxiety    Arthritis    OP   Breast cancer ([place])    Depression    Family history of breast cancer 02/13/2020   History of radiation therapy 04/17/20-06/03/20   IMRT to Left breast Dr. 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   Personal history of radiation therapy    Premature ovarian failure    Dx'd age 36   Thyroid disease    hypothyroid    Past Surgical History:  Procedure Laterality Date   BREAST BIOPSY Left 02/04/2020   breast and lymphnode   BREAST LUMPECTOMY Left 03/12/2020   BREAST LUMPECTOMY WITH RADIOACTIVE SEED AND SENTINEL LYMPH NODE BIOPSY Left 03/12/2020   Procedure: LEFT BREAST LUMPECTOMY WITH RADIOACTIVE SEED AND LEFT TARGETED RADIOACTIVE SEED LYMPH NODE BIOPSY AND LEFT SENTINEL LYMPH NODE MAPPING;  Surgeon: Erroll Luna, MD;  Location: [facility];  Service: General;  Laterality: Left;   DILATATION & CURETTAGE/HYSTEROSCOPY WITH MYOSURE N/A 10/07/2015   Procedure: DILATATION & CURETTAGE/HYSTEROSCOPY WITH MYOSURE;  Surgeon: Nunzio Cobbs, MD;  Location: [facility];  Service: Gynecology;  Laterality: N/A;  endometrial polyp   WISDOM TOOTH EXTRACTION      There were no vitals filed for this visit.   Subjective Assessment - 09/08/20 1534      Subjective Pt returns for her 3 month L-Dex screen.    Pertinent History Patient was diagnosed on 01/23/2020 with left 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 Patient underwent a left lumpectomy and sentinel node biopsy (1/4 positive) on 03/12/2020. It is ER/PR positive and HER2 negative with a Ki67 of 30%.                    L-DEX FLOWSHEETS - 09/08/20 1500       L-DEX LYMPHEDEMA SCREENING   Measurement Type Unilateral    L-DEX MEASUREMENT EXTREMITY Upper Extremity    POSITION  Standing    DOMINANT SIDE Right    At Risk Side Left    BASELINE SCORE (UNILATERAL) -0.7    L-DEX SCORE (UNILATERAL) 0    VALUE CHANGE (UNILAT) 0.7                                    PT Long Term Goals - 06/04/20 1534       PT LONG TERM GOAL #1   Title Patient will demonstrate she has regained full shoulder ROM and function post operatively compared to baselines.    Time 4    Period Weeks    Status Achieved      PT  LONG TERM GOAL #2   Title Patient will increase left shoulder flexion to >/= 150 degrees for increased ease reaching.    Time 4    Period Weeks    Status Achieved      PT LONG TERM GOAL #3   Title Patient will increase left shoulder abduction to >/= 165 degrees for increased ease reaching.    Time 4    Period Weeks    Status Achieved      PT LONG TERM GOAL #4   Title Patient will improve her DASH score to be </= 10 for improved overall uppre extremity function.    Baseline 0 today    Time 4    Period Weeks    Status Achieved      PT LONG TERM GOAL #5   Title Patient will report >/= 50% improvement in left axillary cording.    Time 4    Period Weeks    Status Achieved      PT LONG TERM GOAL #6   Title Pt will be fit for class 1 sleeve/gauntlet and will demonstrate proper donning/doffing and review times to wear    Period Weeks    Status Achieved      PT LONG TERM GOAL #7   Title Pt will return demonstrate proper Self MLD  to left breast    Time 1    Status Achieved      PT LONG TERM GOAL #8   Title Pt will be educated in [facility] strength program and will be independent in its performance    Time 6    Period Weeks    Status Achieved      PT LONG TERM GOAL  #9   TITLE Pt will maintain full shoulder ROM as she progresses through radiation    Time 6    Status Achieved                   Plan - 09/08/20 1539     Clinical Impression Statement Pt returns for her 3 month L-Dex screen. Her change from baseline of 0.7 is WNLs so no further treatment is required at this time except cont every 3 month L-Dex screens which pt is agreeable to.    PT Next Visit Plan Cont every 3 month L-Dex screens for up to 2 years from her SLNB.    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

## 2020-09-09 ENCOUNTER — Ambulatory Visit (INDEPENDENT_AMBULATORY_CARE_PROVIDER_SITE_OTHER): Payer: 59 | Admitting: Addiction (Substance Use Disorder)

## 2020-09-09 ENCOUNTER — Telehealth: Payer: Self-pay | Admitting: Adult Health

## 2020-09-09 DIAGNOSIS — F411 Generalized anxiety disorder: Secondary | ICD-10-CM | POA: Diagnosis not present

## 2020-09-09 NOTE — Progress Notes (Signed)
      [facility] Counselor/Therapist Progress Note  Patient ID: Michaela Roman, MRN: [id number],    Date: 09/09/2020  Time Spent: 45mins  Treatment Type: Individual Therapy  Reported Symptoms: feeling more free  Mental Status Exam:  Appearance:   Casual     Behavior:  Appropriate and Sharing  Motor:  Normal  Speech/Language:   Clear and Coherent and Normal Rate  Affect:  Appropriate and Congruent  Mood:  normal   Thought process:  normal  Thought content:    Obsessions and Rumination  Sensory/Perceptual disturbances:    WNL  Orientation:  x4  Attention:  Good  Concentration:  Good  Memory:  WNL  Fund of knowledge:   Good  Insight:    Fair  Judgment:   Good  Impulse Control:  Good   Risk Assessment: Danger to Self:  No. Denied.  Self-injurious Behavior: No Danger to Others: No Duty to Warn:no Physical Aggression / Violence:No  Access to Firearms a concern: No  Gang Involvement:No    Subjective: Client reported that she had a really good week and a more still spirit this week. Client reported that her husband went out of town and it made her feel more at ease, to have the house to herself and do things on her own time. Client reported feeling a little anxious around her husband at times and so when he was gone, her nervous system was more grounded. Therapist used MI, Mindfulness, and psychoed with client to support and validate her feelings, educate more about her nervous system, and help her ground emotionally. Client as she was processing what he would do, realized he was assessing for "threats" and she recognized that her husband is more protective over her and wants to make sure they are safe in their home and their finances. She became more at ease and even thankful for what he does as she processed.    Interventions: Cognitive Behavioral Therapy, Mindfulness Meditation, Motivational Interviewing, and Psycho-education/Bibliotherapy  Diagnosis:   ICD-10-CM    1. GAD (generalized anxiety disorder)  F41.1       Plan of Care: Client to return for weekly therapy with Sammuel Cooper, therapist, to review again in 6 months.  Client is to continue seeing medication provider for support of mood management.   Client to engage in CBT: challenging negative internal ruminations and self-talk AEB journaling daily or expressing thoughts to support persons in their life and then challenging it with truth.  Client to engage in tapping to tap in healthy cognition that challenges negative rumination or deep negative core belief.  Client to practice DBT distress tolerance skills to decrease crying spells and thoughts of not being able to endure their suffering AEB using mindfulness, deep breathing, and TIP to increase tolerance for discomfort, discharge emotional distress, and increase their understanding that they can do hard things.  Client to utilize BSP (brainspotting) with therapist to help client identify and process triggers for their depressive episode and anxiety with goal of reducing said SUDs caused by depression/anxiety by 33% in the next 6 months.  Client to prioritize sleep 8+ hours each week night AEB going to bed by 10pm each night.   Barnie Del, LCSW, LCAS, CCTP, CCS-I, BSP

## 2020-09-09 NOTE — Telephone Encounter (Signed)
Scheduled appointment per 07/12 sch msg. Left message 

## 2020-09-11 NOTE — Progress Notes (Signed)
[place]  [phone] [phone]) [phone]     ID: Michaela Roman DOB: 05-04-1970  MR#: [id number]  RAQ#:[id number]  Patient Care Team: Glenis Smoker, MD as PCP - General (Family Medicine) Mauro Kaufmann, RN as Oncology Nurse Navigator Rockwell Germany, RN as Oncology Nurse Navigator Erroll Luna, MD as Consulting Physician (General Surgery) Magrinat, Virgie Dad, MD as Consulting Physician (Oncology) Gery Pray, MD as Consulting Physician (Radiation Oncology) Nunzio Cobbs, MD as Consulting Physician (Obstetrics and Gynecology) Scot Dock, NP OTHER MD:  I connected with Windle Guard on 09/12/20 at  2:30 PM EDT by video enabled telemedicine visit and verified that I am speaking with the correct person using two identifiers.   I discussed the limitations, risks, security and privacy concerns of performing an evaluation and management service by telemedicine and the availability of in-person appointments. I also discussed with the patient that there may be a patient responsible charge related to this service. The patient expressed understanding and agreed to proceed.   Other persons participating in the visit and their role in the encounter: None  Patient's location: Home Provider's location: [place]  Others participating in call: none   CHIEF COMPLAINT: Estrogen receptor positive breast cancer  CURRENT TREATMENT: tamoxifen   INTERVAL HISTORY: [name] was contacted today for follow up of her estrogen receptor positive breast cancer.   Since her last visit, she completed radiation therapy on 06/03/2020.  She started tamoxifen on 06/29/2020.  She is tolerating this moderately well.  She does have some hot flashes.  At her last appointment with Dr.Magrinat, she was experiencing some PTSD symptoms and he started her on Effexor.  She notes that she is feeling much better.  She says that her racing  thoughts and irritability are much improved since starting the Effexor.    She is taking gabapentin at night and notes that she is able to sleep better when she takes two tablets instead of one.  She wonders if the gabapentin could be related to nocturia she is experiencing.    She underwent bilateral breast mammogram on 08/29/2020 that demonstrated no evidence of malignancy and breast density category B.     REVIEW OF SYSTEMS: Review of Systems  Constitutional:  Negative for appetite change, chills, fatigue, fever and unexpected weight change.  HENT:   Negative for hearing loss, lump/mass and trouble swallowing.   Eyes:  Negative for eye problems and icterus.  Respiratory:  Negative for chest tightness, cough and shortness of breath.   Cardiovascular:  Negative for chest pain, leg swelling and palpitations.  Gastrointestinal:  Negative for abdominal distention, abdominal pain, constipation, diarrhea, nausea and vomiting.  Endocrine: Negative for hot flashes.  Genitourinary:  Negative for difficulty urinating.   Musculoskeletal:  Negative for arthralgias.  Skin:  Negative for itching and rash.  Neurological:  Negative for dizziness, extremity weakness, headaches and numbness.  Hematological:  Negative for adenopathy. Does not bruise/bleed easily.  Psychiatric/Behavioral:  Negative for depression. The patient is not nervous/anxious.       COVID 19 VACCINATION STATUS: Status post Pfizer x2 with booster October 2021   HISTORY OF CURRENT ILLNESS: From the original intake note:  Michaela Roman herself palpated a left breast lump. Of note, annual screening mammogram from 08/27/2019 was negative. She underwent left diagnostic mammography with tomography and left breast ultrasonography at [facility] on 01/23/2020 showing: breast density category B; palpable 1.3 cm mass in left  breast at 2 o'clock that extends to skin; one borderline abnormal left axillary lymph node.  Accordingly  on 02/04/2020 she proceeded to biopsy of the left breast area in question. The pathology from this procedure ([id number]) showed: invasive ductal carcinoma, grade 2. Prognostic indicators significant for: estrogen receptor, 80% positive and progesterone receptor, 80% positive, both with moderate-strong staining intensity. Proliferation marker Ki67 at 30%. HER2 negative by immunohistochemistry (0).  The biopsied lymph node was positive for metastatic carcinoma.  The patient's subsequent history is as detailed below.   PAST MEDICAL HISTORY: Past Medical History:  Diagnosis Date   Anxiety    Arthritis    OP   Breast cancer ([place])    Depression    Family history of breast cancer 02/13/2020   History of radiation therapy 04/17/20-06/03/20   IMRT to Left breast Dr. 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   Personal history of radiation therapy    Premature ovarian failure    Dx'd age 32   Thyroid disease    hypothyroid    PAST SURGICAL HISTORY: Past Surgical History:  Procedure Laterality Date   BREAST BIOPSY Left 02/04/2020   breast and lymphnode   BREAST LUMPECTOMY Left 03/12/2020   BREAST LUMPECTOMY WITH RADIOACTIVE SEED AND SENTINEL LYMPH NODE BIOPSY Left 03/12/2020   Procedure: LEFT BREAST LUMPECTOMY WITH RADIOACTIVE SEED AND LEFT TARGETED RADIOACTIVE SEED LYMPH NODE BIOPSY AND LEFT SENTINEL LYMPH NODE MAPPING;  Surgeon: Erroll Luna, MD;  Location: [facility];  Service: General;  Laterality: Left;   [place] N/A 10/07/2015   Procedure: DILATATION & CURETTAGE/HYSTEROSCOPY WITH MYOSURE;  Surgeon: Nunzio Cobbs, MD;  Location: [facility];  Service: Gynecology;  Laterality: N/A;  endometrial polyp   WISDOM TOOTH EXTRACTION      FAMILY HISTORY: Family History  Problem Relation Age of Onset   Depression Mother    Multiple sclerosis Father    Thyroid disease Sister        hypothyroidism    Hypertension Maternal Grandmother    Heart disease Maternal Grandmother    Depression Maternal Grandmother    Diabetes Maternal Grandfather    Other Maternal Grandfather        benign brain tumor   Dementia Paternal Grandmother    Alcohol abuse Paternal Grandfather    Prostate cancer Cousin 82       maternal cousin   Colon cancer Other        MGF's brother; dx 5s   Her father is age 42 and her mother age 69, as of 01/2020. Varshini has one brother and one sister. She reports breast cancer in her mother's paternal first cousin in her 50's. She also notes colon cancer in her mother's paternal uncle in his 84's.   GYNECOLOGIC HISTORY:  Patient's last menstrual period was 08/07/2015 (approximate). Menarche: 50 years old [place] P 0 LMP 2015 Contraceptive: used from age [age] HRT used for 11 years, stopped with cancer diagnosis  Hysterectomy? no BSO? 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 Husband Talmadge "Letitia Caul" Burt Ek is a Doctor, hospital, as well as the Chief of Staff [employer] here in [place]. She lives at home with Letitia Caul, no pets.  The patient is a Buddhist.    ADVANCED DIRECTIVES: in place   HEALTH MAINTENANCE: Social History   Tobacco Use   Smoking  status: Never   Smokeless tobacco: Never  Vaping Use   Vaping Use: Never used  Substance Use Topics   Alcohol use: Yes    Alcohol/week: 2.0 standard drinks    Types: 2 Glasses of wine per week    Comment: one drink a week   Drug use: No     Colonoscopy: n/a (age)  PAP: 09/2019  Bone density: 11/2018, -1.5   Allergies  Allergen Reactions   Sertraline Hives    Current Outpatient Medications  Medication Sig Dispense Refill   calcium gluconate 500 MG tablet Take 1,000 tablets by mouth daily.     gabapentin (NEURONTIN) 300 MG capsule Take 1-2 capsules at bedtime 90 capsule 4   levothyroxine (SYNTHROID) 112 MCG tablet  Take 1 tablet by mouth daily before breakfast. 90 tablet 0   Multiple Vitamins-Minerals (MULTIVITAMIN PO) Take 1 tablet by mouth daily.     tamoxifen (NOLVADEX) 20 MG tablet Take 1 tablet (20 mg total) by mouth daily. 30 tablet 6   triamcinolone (KENALOG) 0.1 % Apply topically 2 (two) times daily as needed. (Patient not taking: Reported on 08/26/2020)     venlafaxine (EFFEXOR) 37.5 MG tablet Take 1 tablet (37.5 mg total) by mouth 2 (two) times daily. 90 tablet 4   No current facility-administered medications for this visit.    OBJECTIVE:  There were no vitals filed for this visit.    There is no height or weight on file to calculate BMI.   Wt Readings from Last 3 Encounters:  07/22/20 154 lb 3.2 oz (69.9 kg)  07/07/20 154 lb 3.2 oz (69.9 kg)  04/24/20 154 lb 7 oz (70.1 kg)      ECOG FS:1  Patient appears well, in no apparent distress.  Mood and behavior are normal, speech is normal, breathing is non labored.    LAB RESULTS:  CMP     Component Value Date/Time   NA 141 03/07/2020 1050   NA 140 10/17/2018 0922   K 4.4 03/07/2020 1050   CL 102 03/07/2020 1050   CO2 30 03/07/2020 1050   GLUCOSE 99 03/07/2020 1050   BUN 15 03/07/2020 1050   BUN 12 10/17/2018 0922   CREATININE 0.91 03/07/2020 1050   CREATININE 1.03 (H) 02/13/2020 0821   CREATININE 0.95 08/22/2015 1653   CALCIUM 9.3 03/07/2020 1050   PROT 6.8 03/07/2020 1050   PROT 6.5 10/17/2018 0922   ALBUMIN 4.2 03/07/2020 1050   ALBUMIN 4.5 10/17/2018 0922   AST 26 03/07/2020 1050   AST 20 02/13/2020 0821   ALT 24 03/07/2020 1050   ALT 21 02/13/2020 0821   ALKPHOS 41 03/07/2020 1050   BILITOT 0.6 03/07/2020 1050   BILITOT 0.8 02/13/2020 0821   GFRNONAA >60 03/07/2020 1050   GFRNONAA >60 02/13/2020 0821   GFRAA 81 10/17/2018 0922    No results found for: TOTALPROTELP, ALBUMINELP, A1GS, A2GS, BETS, BETA2SER, GAMS, MSPIKE, SPEI  Lab Results  Component Value Date   WBC 5.3 03/07/2020   NEUTROABS 3.2 03/07/2020    HGB 14.2 03/07/2020   HCT 43.3 03/07/2020   MCV 93.7 03/07/2020   PLT 183 03/07/2020    No results found for: LABCA2  No components found for: ONGEXB284  No results for input(s): INR in the last 168 hours.  No results found for: LABCA2  No results found for: XLK440  No results found for: NUU725  No results found for: DGU440  No results found for: CA2729  No components found for: HGQUANT  No results found for: CEA1 / No results found for: CEA1   No results found for: AFPTUMOR  No results found for: CHROMOGRNA  No results found for: KPAFRELGTCHN, LAMBDASER, KAPLAMBRATIO (kappa/lambda light chains)  No results found for: HGBA, HGBA2QUANT, HGBFQUANT, HGBSQUAN (Hemoglobinopathy evaluation)   No results found for: LDH  No results found for: IRON, TIBC, IRONPCTSAT (Iron and TIBC)  No results found for: FERRITIN  Urinalysis    Component Value Date/Time   BILIRUBINUR n 08/22/2015 1436   PROTEINUR n 08/22/2015 1436   UROBILINOGEN negative 08/22/2015 1436   NITRITE n 08/22/2015 1436   LEUKOCYTESUR Negative 08/22/2015 1436    STUDIES: MM DIAG BREAST TOMO BILATERAL  Result Date: 08/29/2020 CLINICAL DATA:  History of a left lumpectomy and adjuvant radiation therapy, lumpectomy performed in January 2022. Routine annual surveillance. EXAM: DIGITAL DIAGNOSTIC BILATERAL MAMMOGRAM WITH TOMOSYNTHESIS AND CAD TECHNIQUE: Bilateral digital diagnostic mammography and breast tomosynthesis was performed. The images were evaluated with computer-aided detection. COMPARISON:  Previous exam(s). ACR Breast Density Category b: There are scattered areas of fibroglandular density. FINDINGS: There are post lumpectomy changes in the central, slightly upper, outer, left breast. There are no masses or areas of non-surgical architectural distortion. There are no suspicious calcifications. IMPRESSION: 1. No evidence of new or recurrent breast carcinoma. 2. Benign post lumpectomy changes on the left.  RECOMMENDATION: Diagnostic mammography in 1 year per standard post lumpectomy protocol. I have discussed the findings and recommendations with the patient. If applicable, a reminder letter will be sent to the patient regarding the next appointment. BI-RADS CATEGORY  2: Benign. Electronically Signed   By: Lajean Manes M.D.   On: 08/29/2020 10:34    ELIGIBLE FOR AVAILABLE RESEARCH PROTOCOL: AET  ASSESSMENT: 50 y.o. [place] woman status post left breast and left axillary lymph node biopsy 02/04/2020 for a clinical T1c N1, stage IB invasive ductal carcinoma, grade 2, estrogen and progesterone receptor positive, HER-2 not amplified, with an MIB-1 of 30%   (1) status post left lumpectomy and targeted axillary lymph node dissection 03/12/2020 for a pT1c pNa, stage IB invasive ductal carcinoma, grade 2, with negative margins  (a) a total of 4 left axillary lymph nodes removed, 1+  (2) MammaPrint to be obtained from the left breast biopsy sample showed a low risk, luminal a type breast cancer, predicting a 96% 5-year metastasis free survival with hormone therapy alone and no significant chemotherapy benefit  (3) genetics testing 02/26/2020 showed a 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   (b) The CancerNext-Expanded gene panel offered by [employer] found no additional mutations in AIP, ALK, APC, ATM, AXIN2, BAP1, BARD1, BLM, BMPR1A, BRCA1, BRCA2, BRIP1, CDC73, CDH1, CDK4, CDKN1B, CDKN2A, CHEK2, CTNNA1, DICER1, FANCC, FH, FLCN, GALNT12, KIF1B, LZTR1, MAX, MEN1, MET, MLH1, MSH2, MSH3, MSH6, MUTYH, NBN, NF1, NF2, NTHL1, PALB2, PHOX2B, PMS2, POT1, PRKAR1A, PTCH1, PTEN, RAD51C, RAD51D, RB1, RECQL, RET, SDHA, SDHAF2, SDHB, SDHC, SDHD, SMAD4, SMARCA4, SMARCB1, SMARCE1, STK11, SUFU, TMEM127, TP53, TSC1, TSC2, VHL and XRCC2 (sequencing and deletion/duplication);  EGFR, EGLN1, HOXB13, KIT, MITF, PDGFRA, POLD1, and POLE (sequencing only); EPCAM and GREM1 (deletion/duplication only)  (4) adjuvant radiation 04/17/2020 through 06/03/2020 Site Technique Total Dose (Gy) Dose per Fx (Gy) Completed Fx Beam Energies  Breast, Left: Breast_Lt 3D 50.4/50.4 1.8 28/28 6X, 10X  Breast, Left: Breast_Lt_SCLV 3D 50.4/50.4 1.8 28/28 6X, 15X  Breast, Left: Breast_Lt_Bst 3D  12/12 2 6/6 6X   (5) tamoxifen started 06/29/2020  (a) most recent bone scan shows osteopenia   PLAN: Cleva returns virtually today for follow up after having started Effexor for PTSD symptoms.  She is doing much better on Effexor and it sounds like it is managing her PTSD and hot flashes from tamoxifen.  She does not report any negative affects.  She is considering changing to the 36m xr dose.   We reviewed that she can certainly transition to the Effexor 720mXR dose, and she should do that first and see if her nocturia improves.  If it doesn't improve, then she will decrease the gabapentin back to one pill per night.    NiKeyonaoesn't have any signs of breast cancer recurrence today, and she will continue on Tamoxifen daily.  We reviewed that she should undergo a clinical breast exam every 6 months.  She saw her surgeon last in April, so she should have an in person visit with Dr. MaJana Hakimwhich I requested.    She knows to call for any questions that may arise between now and her next appointment.  We are happy to see her sooner if needed.   The patient was advised to call back or seek an in-person evaluation if the symptoms worsen or if the condition fails to improve as anticipated.   I provided 20 minutes of face-to-face video visit time during this encounter, and > 50% was spent counseling as documented under my assessment & plan.* Wilber BihariNP 09/12/20 4:27 PM Medical Oncology and Hematology [facility] [id number] [id number]. [phone]   Fax. [phone]  *Total Encounter Time as defined by the Centers for Medicare and Medicaid Services includes, in addition to the face-to-face time of a patient visit (documented in the note above) non-face-to-face time: obtaining and reviewing outside history, ordering and reviewing medications, tests or procedures, care coordination (communications with other health care professionals or caregivers) and documentation in the medical record.

## 2020-09-12 ENCOUNTER — Encounter: Payer: Self-pay | Admitting: Adult Health

## 2020-09-12 ENCOUNTER — Inpatient Hospital Stay: Payer: 59 | Attending: Adult Health | Admitting: Adult Health

## 2020-09-12 DIAGNOSIS — Z170 Estrogen receptor positive status [ER+]: Secondary | ICD-10-CM

## 2020-09-12 DIAGNOSIS — C50412 Malignant neoplasm of upper-outer quadrant of left female breast: Secondary | ICD-10-CM

## 2020-09-16 ENCOUNTER — Ambulatory Visit (INDEPENDENT_AMBULATORY_CARE_PROVIDER_SITE_OTHER): Payer: 59 | Admitting: Addiction (Substance Use Disorder)

## 2020-09-16 ENCOUNTER — Other Ambulatory Visit: Payer: Self-pay

## 2020-09-16 DIAGNOSIS — F4311 Post-traumatic stress disorder, acute: Secondary | ICD-10-CM

## 2020-09-16 DIAGNOSIS — F411 Generalized anxiety disorder: Secondary | ICD-10-CM

## 2020-09-16 NOTE — Progress Notes (Signed)
      [facility] Counselor/Therapist Progress Note  Patient ID: Michaela Roman, MRN: [id number],    Date: 09/16/2020  Time Spent: 30mins  Treatment Type: Individual Therapy  Reported Symptoms: busy mind, anxious, tearful  Mental Status Exam:  Appearance:   Casual     Behavior:  Appropriate and Sharing  Motor:  Normal  Speech/Language:   Clear and Coherent and Normal Rate  Affect:  Appropriate and Congruent  Mood:  anxious and labile   Thought process:  normal  Thought content:    Obsessions and Rumination  Sensory/Perceptual disturbances:    WNL  Orientation:  x4  Attention:  Good  Concentration:  Good  Memory:  WNL  Fund of knowledge:   Good  Insight:    Good  Judgment:   Good  Impulse Control:  Fair   Risk Assessment: Danger to Self:  No. Denied.  Self-injurious Behavior: No Danger to Others: No Duty to Warn:no Physical Aggression / Violence:No  Access to Firearms a concern: No  Gang Involvement:No    Subjective: Client reported having a busy racing mind, anxious, and labile with her mood. Client struggling to cope with new or increased symptoms of trauma. Client shared about how this cancer has changed her for the good but also how its hurt her emotionally. 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 Client reported not feeling like a "breast cancer survivor, although she has been cleared from all cancer cells" and dealing with frustration that she never feels at ease like shes somehow "cured" or survived, but instead is just dealing with it all. Therapist used MI with client to validate her feelings and affirm her progress emotionally. Therapist used psychoeducation to educated her about acute stress disorder and trauma  symptom and to validate what the client is experiencing. Therapist assessed for stability and client denied SI/HI/AVH.  Interventions: Motivational Interviewing and Psycho-education/Bibliotherapy  Diagnosis:   ICD-10-CM   1. Acute posttraumatic stress disorder  F43.11     2. GAD (generalized anxiety disorder)  F41.1       Plan of Care: Client to return for weekly therapy with Sammuel Cooper, therapist, to review again in 6 months.  Client is to continue seeing medication provider for support of mood management.   Client to engage in CBT: challenging negative internal ruminations and self-talk AEB journaling daily or expressing thoughts to support persons in their life and then challenging it with truth.  Client to engage in tapping to tap in healthy cognition that challenges negative rumination or deep negative core belief.  Client to practice DBT distress tolerance skills to decrease crying spells and thoughts of not being able to endure their suffering AEB using mindfulness, deep breathing, and TIP to increase tolerance for discomfort, discharge emotional distress, and increase their understanding that they can do hard things.  Client to utilize BSP (brainspotting) with therapist to help client identify and process triggers for their depressive episode and anxiety with goal of reducing said SUDs caused by depression/anxiety by 33% in the next 6 months.  Client to prioritize sleep 8+ hours each week night AEB going to bed by 10pm each night.   Barnie Del, LCSW, LCAS, CCTP, CCS-I, BSP

## 2020-09-19 ENCOUNTER — Other Ambulatory Visit: Payer: Self-pay

## 2020-09-19 ENCOUNTER — Encounter: Payer: Self-pay | Admitting: Behavioral Health

## 2020-09-19 ENCOUNTER — Ambulatory Visit (INDEPENDENT_AMBULATORY_CARE_PROVIDER_SITE_OTHER): Payer: 59 | Admitting: Behavioral Health

## 2020-09-19 DIAGNOSIS — F4311 Post-traumatic stress disorder, acute: Secondary | ICD-10-CM | POA: Diagnosis not present

## 2020-09-19 DIAGNOSIS — F330 Major depressive disorder, recurrent, mild: Secondary | ICD-10-CM | POA: Diagnosis not present

## 2020-09-19 DIAGNOSIS — F411 Generalized anxiety disorder: Secondary | ICD-10-CM

## 2020-09-19 NOTE — Progress Notes (Signed)
[facility] Med Check  Patient ID: Michaela Roman,  MRN: [id number]  PCP: Glenis Smoker, MD  Date of Evaluation: 09/19/2020 Time spent:30 minutes  Chief Complaint:  Chief Complaint   Follow-up; Medication Problem; Anxiety; Depression; Post-Traumatic Stress Disorder     HISTORY/CURRENT STATUS: HPI 50 year old patient presents to this office for follow up and medication management. She is recent patient of [facility] Headings, [place]. She says that her oncologist suggested Effexor for assistance with PTSD, depression and anxiety. She was having some issues with leg cramps in the middle of night. She does not like the way Effexor was making her feel and wanted to consider going back on Lexapro. She has only been on the Effexor for 3 weeks. She says she has noticed decreased sex drive. Says the initiation of Gabapentin has helped with her leg cramps.  She reports anxiety today at 2/10 and depression at 2/10. She is sleeping 8-9 hours daily. Says that she is opening back up to friends and socializing more. NO mania, no SI/HI She agrees to wait a few more weeks to see if mediation will work better.  Wellbutrin- Insomnia, racing thoughts Stopped after 2 weeks. Zoloft- Took for a few weeks. Had insomnia. Developed hives.  Lexapro Lorazepam- Had a calming effect. Not effective with decreasing middle of the night awakenings. Melatonin- Vivid dreams Trazodone- Felt sedated but did not sleep.   Individual Medical History/ Review of Systems: Changes? :No   Allergies: Sertraline  Current Medications:  Current Outpatient Medications:    calcium gluconate 500 MG tablet, Take 1,000 tablets by mouth daily., Disp: , Rfl:    gabapentin (NEURONTIN) 300 MG capsule, Take 1-2 capsules at bedtime, Disp: 90 capsule, Rfl: 4   levothyroxine (SYNTHROID) 112 MCG tablet, Take 1 tablet by mouth daily before breakfast., Disp: 90 tablet, Rfl: 0   Multiple Vitamins-Minerals (MULTIVITAMIN PO), Take 1  tablet by mouth daily., Disp: , Rfl:    tamoxifen (NOLVADEX) 20 MG tablet, Take 1 tablet (20 mg total) by mouth daily., Disp: 30 tablet, Rfl: 6   venlafaxine (EFFEXOR) 37.5 MG tablet, Take 1 tablet (37.5 mg total) by mouth 2 (two) times daily., Disp: 90 tablet, Rfl: 4   triamcinolone (KENALOG) 0.1 %, Apply topically 2 (two) times daily as needed. (Patient not taking: No sig reported), Disp: , Rfl:  Medication Side Effects: none  Family Medical/ Social History: Changes? No  MENTAL HEALTH EXAM:  Last menstrual period 08/07/2015.There is no height or weight on file to calculate BMI.  General Appearance: Casual and Neat  Eye Contact:  Good  Speech:  Clear and Coherent  Volume:  Normal  Mood:  NA  Affect:  Appropriate and Flat  Thought Process:  Coherent  Orientation:  Full (Time, Place, and Person)  Thought Content: Logical   Suicidal Thoughts:  No  Homicidal Thoughts:  No  Memory:  WNL  Judgement:  Good  Insight:  Good  Psychomotor Activity:  Normal  Concentration:  Concentration: Good  Recall:  Good  Fund of Knowledge: Good  Language: Good  Assets:  Desire for Improvement  ADL's:  Intact  Cognition: WNL  Prognosis:  Good    DIAGNOSES:    ICD-10-CM   1. Acute posttraumatic stress disorder  F43.11     2. GAD (generalized anxiety disorder)  F41.1     3. Mild episode of recurrent major depressive disorder (HCC)  F33.0       Receiving Psychotherapy: Yes    RECOMMENDATIONS:  Patient to  continue on Effexor 37.5 mg twice daily To continue Gabapentin 300 mg 1-2 capsules daily Will report any worsening symptoms promptly To follow up in 4 weeks to reassess. Greater than 50% of 30 face to face time with patient was spent on counseling and coordination of care. We discussed patients recent history of breast cx and symptoms of PTSD that started appearing in 07/2020. Discussed with her that Effexor was a better choice to tx anxiety and depression while she is also taking  Tamoxifen. She wanted to stop Effexor and restart Lexapro. According to studies Lexapro is a stronger CYP2D6 inhibitor and may interfere with the estrogen blocking mechanism of Tamoxifen. She agreed to stay on the Effexor for now and reassess at later date. No medication changes were made at this time.    Elwanda Brooklyn, NP

## 2020-09-23 ENCOUNTER — Other Ambulatory Visit: Payer: Self-pay

## 2020-09-23 ENCOUNTER — Ambulatory Visit (INDEPENDENT_AMBULATORY_CARE_PROVIDER_SITE_OTHER): Payer: 59 | Admitting: Addiction (Substance Use Disorder)

## 2020-09-23 DIAGNOSIS — F411 Generalized anxiety disorder: Secondary | ICD-10-CM | POA: Diagnosis not present

## 2020-09-23 NOTE — Progress Notes (Signed)
      [facility] Counselor/Therapist Progress Note  Patient ID: Michaela Roman, MRN: [id number],    Date: 09/23/2020  Time Spent: 57mn  Treatment Type: Individual Therapy  Reported Symptoms: anxiety, shopping binging.   Mental Status Exam:  Appearance:   Casual     Behavior:  Appropriate and Sharing  Motor:  Normal  Speech/Language:   Clear and Coherent and Normal Rate  Affect:  Appropriate and Congruent  Mood:  anxious   Thought process:  normal  Thought content:    Obsessions and Rumination  Sensory/Perceptual disturbances:    WNL  Orientation:  x4  Attention:  Good  Concentration:  Good  Memory:  WNL  Fund of knowledge:   Good  Insight:    Good  Judgment:   Good  Impulse Control:  Fair   Risk Assessment: Danger to Self:  No. Denied.  Self-injurious Behavior: No Danger to Others: No Duty to Warn:no Physical Aggression / Violence:No  Access to Firearms a concern: No  Gang Involvement:No   Subjective: Client reported wanting to disconnect from an obsessive thought or from her anxious thoughts. Client reported trying to stop those things and it turning into a shopping splurge to try to keep the anxiety from cropping up. 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 Client also concerned she will over commit but is struggling to feel like shes not doing enough for people of color. Therapist used MI & mindfulness to support her in processing and help her stay engaged and grounded. Client discussed her meditation [facility] for the last few weeks [facility], and how it has helped her just "be" and feel "how you feel". Therapist assessed for stability and client denied SI/HI/AVH.  Interventions: Mindfulness Meditation and Motivational Interviewing  Diagnosis:   ICD-10-CM   1. GAD (generalized anxiety disorder)  F41.1       Plan of Care: Client to return for weekly therapy with KSammuel Cooper therapist, to review  again in 6 months.  Client is to continue seeing medication provider for support of mood management.   Client to engage in CBT: challenging negative internal ruminations and self-talk AEB journaling daily or expressing thoughts to support persons in their life and then challenging it with truth.  Client to engage in tapping to tap in healthy cognition that challenges negative rumination or deep negative core belief.  Client to practice DBT distress tolerance skills to decrease crying spells and thoughts of not being able to endure their suffering AEB using mindfulness, deep breathing, and TIP to increase tolerance for discomfort, discharge emotional distress, and increase their understanding that they can do hard things.  Client to utilize BSP (brainspotting) with therapist to help client identify and process triggers for their depressive episode and anxiety with goal of reducing said SUDs caused by depression/anxiety by 33% in the next 6 months.  Client to prioritize sleep 8+ hours each week night AEB going to bed by 10pm each night.   KBarnie Del LCSW, LCAS, CCTP, CCS-I, BSP

## 2020-10-08 ENCOUNTER — Ambulatory Visit: Payer: 59 | Admitting: Addiction (Substance Use Disorder)

## 2020-10-08 ENCOUNTER — Ambulatory Visit: Payer: 59 | Admitting: Psychiatry

## 2020-10-09 ENCOUNTER — Ambulatory Visit: Payer: 59 | Admitting: Addiction (Substance Use Disorder)

## 2020-10-09 DIAGNOSIS — F411 Generalized anxiety disorder: Secondary | ICD-10-CM

## 2020-10-09 NOTE — Progress Notes (Signed)
Therapist found out that Client was out of [place] when she asked about client's privacy and location for the virtual video visit they were about to start.  Discontinued the session. No Charge.

## 2020-10-17 ENCOUNTER — Other Ambulatory Visit: Payer: Self-pay | Admitting: Behavioral Health

## 2020-10-17 ENCOUNTER — Telehealth: Payer: Self-pay | Admitting: Behavioral Health

## 2020-10-17 ENCOUNTER — Ambulatory Visit (INDEPENDENT_AMBULATORY_CARE_PROVIDER_SITE_OTHER): Payer: 59 | Admitting: Behavioral Health

## 2020-10-17 ENCOUNTER — Encounter: Payer: Self-pay | Admitting: Behavioral Health

## 2020-10-17 ENCOUNTER — Other Ambulatory Visit: Payer: Self-pay

## 2020-10-17 DIAGNOSIS — F330 Major depressive disorder, recurrent, mild: Secondary | ICD-10-CM

## 2020-10-17 DIAGNOSIS — F331 Major depressive disorder, recurrent, moderate: Secondary | ICD-10-CM

## 2020-10-17 DIAGNOSIS — F411 Generalized anxiety disorder: Secondary | ICD-10-CM

## 2020-10-17 DIAGNOSIS — F4323 Adjustment disorder with mixed anxiety and depressed mood: Secondary | ICD-10-CM

## 2020-10-17 DIAGNOSIS — F5105 Insomnia due to other mental disorder: Secondary | ICD-10-CM

## 2020-10-17 DIAGNOSIS — F4311 Post-traumatic stress disorder, acute: Secondary | ICD-10-CM

## 2020-10-17 DIAGNOSIS — F99 Mental disorder, not otherwise specified: Secondary | ICD-10-CM

## 2020-10-17 MED ORDER — GABAPENTIN 600 MG PO TABS: 600.0000 mg | ORAL_TABLET | Freq: Every day | ORAL | 2 refills | Status: DC

## 2020-10-17 MED ORDER — VILAZODONE HCL 20 MG PO TABS
20.0000 mg | ORAL_TABLET | Freq: Every day | ORAL | 1 refills | Status: DC
Start: 2020-10-17 — End: 2020-12-30

## 2020-10-17 MED ORDER — GABAPENTIN 300 MG PO CAPS: ORAL_CAPSULE | ORAL | 3 refills | Status: DC

## 2020-10-17 NOTE — Telephone Encounter (Signed)
Please review

## 2020-10-17 NOTE — Telephone Encounter (Signed)
#  30, Gabapentin 600 mg at bedtime. Script sent.

## 2020-10-17 NOTE — Telephone Encounter (Signed)
Pt called in stating that her insurance will fill prescription for Gabapentin '300mg'$ . However, they will fill prescription for '600mg'$ . She would like to know if [facility] would be willing to write a prescription instead for '600mg'$  for 30 days. Ph: [phone]

## 2020-10-17 NOTE — Progress Notes (Signed)
[facility] Med Check  Patient ID: SHATERIKA LOUGHMAN,  MRN: [id number]  PCP: Glenis Smoker, MD  Date of Evaluation: 10/17/2020 Time spent:30 minutes  Chief Complaint:  Chief Complaint   Depression; Anxiety; Trauma; Medication Problem; Medication Refill; Follow-up     HISTORY/CURRENT STATUS: HPI 50 year old female presents to this office for follow up and medication management. She said that she is doing fairly well but does not like the way Effexor makes her feel. She has the restless legs and restlessness in general. She is also have sexual side effects with the medication. Says that she would like to ween off Effexor  75 mg and try Viibrd as alternative. She says she understands that she is on Tamoxifen for estrogen reduction and that some antidepressants can interfere. Research has shown that [place] is also safer choice. She is reporting anxiety today at 3/10 and depression at 3/10. She was sleeping better on gabapentin but ran out recently. She would like to talk about sleep more next visit after she has been on the [place] for a few weeks. No mania, no psychosis. No SI/HI.    Wellbutrin- Insomnia, racing thoughts Stopped after 2 weeks. Zoloft- Took for a few weeks. Had insomnia. Developed hives.  Lexapro Lorazepam- Had a calming effect. Not effective with decreasing middle of the night awakenings. Melatonin- Vivid dreams Trazodone- Felt sedated but did not sleep.        Individual Medical History/ Review of Systems: Changes? :No   Allergies: Sertraline  Current Medications:  Current Outpatient Medications:    Vilazodone HCl (VIIBRYD) 20 MG TABS, Take 1 tablet (20 mg total) by mouth daily., Disp: 30 tablet, Rfl: 1   calcium gluconate 500 MG tablet, Take 1,000 tablets by mouth daily., Disp: , Rfl:    gabapentin (NEURONTIN) 300 MG capsule, Take 1-2 capsules at bedtime, Disp: 90 capsule, Rfl: 3   levothyroxine (SYNTHROID) 112 MCG tablet, Take 1 tablet by mouth  daily before breakfast., Disp: 90 tablet, Rfl: 0   Multiple Vitamins-Minerals (MULTIVITAMIN PO), Take 1 tablet by mouth daily., Disp: , Rfl:    tamoxifen (NOLVADEX) 20 MG tablet, Take 1 tablet (20 mg total) by mouth daily., Disp: 30 tablet, Rfl: 6   triamcinolone (KENALOG) 0.1 %, Apply topically 2 (two) times daily as needed. (Patient not taking: No sig reported), Disp: , Rfl:    venlafaxine (EFFEXOR) 37.5 MG tablet, Take 1 tablet (37.5 mg total) by mouth 2 (two) times daily., Disp: 90 tablet, Rfl: 4 Medication Side Effects: none  Family Medical/ Social History: Changes? No  MENTAL HEALTH EXAM:  Last menstrual period 08/07/2015.There is no height or weight on file to calculate BMI.  General Appearance: Casual and Neat  Eye Contact:  Good  Speech:  Clear and Coherent  Volume:  Increased  Mood:  NA  Affect:  Appropriate  Thought Process:  Coherent  Orientation:  Full (Time, Place, and Person)  Thought Content: Logical   Suicidal Thoughts:  No  Homicidal Thoughts:  No  Memory:  WNL  Judgement:  Good  Insight:  Good  Psychomotor Activity:  Normal  Concentration:  Concentration: Good  Recall:  Good  Fund of Knowledge: Good  Language: Good  Assets:  Desire for Improvement  ADL's:  Intact  Cognition: WNL  Prognosis:  Good    DIAGNOSES:    ICD-10-CM   1. GAD (generalized anxiety disorder)  F41.1 Vilazodone HCl (VIIBRYD) 20 MG TABS    gabapentin (NEURONTIN) 300 MG capsule  2. Acute posttraumatic stress disorder  F43.11 Vilazodone HCl (VIIBRYD) 20 MG TABS    3. Mild episode of recurrent major depressive disorder (HCC)  F33.0 Vilazodone HCl (VIIBRYD) 20 MG TABS    4. Adjustment disorder with mixed anxiety and depressed mood  F43.23 Vilazodone HCl (VIIBRYD) 20 MG TABS    5. Moderate recurrent major depression (HCC)  F33.1 Vilazodone HCl (VIIBRYD) 20 MG TABS      Receiving Psychotherapy: Yes    RECOMMENDATIONS:  Patient wants to wean off  Effexor.  Will reduce to 37.5 mg  for one week and then stop.  To start Viibryd 10 mg for one week and then 20 mg tablet daily. Provided starter pack sample. #7 10 mg, #7 20 mg tablets.   To continue Gabapentin 300 mg 1-2 capsules daily  Will report any worsening symptoms promptly  To follow up in 4 weeks to reassess.  Greater than 50% of 30 face to face time with patient was spent on counseling and coordination of care. We discussed patients recent history of breast cx and symptoms of PTSD that started appearing in 07/2020. Discussed with her that Effexor was a better choice to tx anxiety and depression while she is also taking Tamoxifen but Viibryd is also a good secondary choice. She wanted to stop Effexor due to side effect such as restlessness, pain in lower extremities. The goal is to avoid strong CYP2D6 inhibitors that may interfere with the estrogen blocking mechanism of Tamoxifen. She agreed to stay on the Effexor for now and reassess at later date. No medication changes were made at this time. Refills provided for Gabapentin.        Elwanda Brooklyn, NP

## 2020-10-23 ENCOUNTER — Encounter: Payer: Self-pay | Admitting: Obstetrics and Gynecology

## 2020-10-23 ENCOUNTER — Other Ambulatory Visit: Payer: Self-pay

## 2020-10-23 ENCOUNTER — Ambulatory Visit (INDEPENDENT_AMBULATORY_CARE_PROVIDER_SITE_OTHER): Payer: 59 | Admitting: Addiction (Substance Use Disorder)

## 2020-10-23 ENCOUNTER — Ambulatory Visit (INDEPENDENT_AMBULATORY_CARE_PROVIDER_SITE_OTHER): Payer: 59 | Admitting: Obstetrics and Gynecology

## 2020-10-23 VITALS — BP 108/64 | HR 62 | Ht 62.5 in | Wt 154.0 lb

## 2020-10-23 DIAGNOSIS — M8580 Other specified disorders of bone density and structure, unspecified site: Secondary | ICD-10-CM | POA: Diagnosis not present

## 2020-10-23 DIAGNOSIS — E28319 Asymptomatic premature menopause: Secondary | ICD-10-CM | POA: Diagnosis not present

## 2020-10-23 DIAGNOSIS — F330 Major depressive disorder, recurrent, mild: Secondary | ICD-10-CM | POA: Diagnosis not present

## 2020-10-23 DIAGNOSIS — Z01419 Encounter for gynecological examination (general) (routine) without abnormal findings: Secondary | ICD-10-CM

## 2020-10-23 NOTE — Progress Notes (Signed)
[facility] Counselor/Therapist Progress Note  Patient ID: Michaela Roman, MRN: [id number],    Date: 10/23/2020  Time Spent: 43mns  Treatment Type: Individual Therapy  Reported Symptoms: feeling excluded, some hypervigilance  Mental Status Exam:  Appearance:   Casual     Behavior:  Appropriate and Sharing  Motor:  Normal  Speech/Language:   Clear and Coherent and Normal Rate  Affect:  Appropriate and Congruent  Mood:  sad   Thought process:  normal  Thought content:    Obsessions and Rumination  Sensory/Perceptual disturbances:    WNL  Orientation:  x4  Attention:  Good  Concentration:  Good  Memory:  WNL  Fund of knowledge:   Good  Insight:    Good  Judgment:   Good  Impulse Control:  Fair   Risk Assessment: Danger to Self:  No. Denied.  Self-injurious Behavior: No Danger to Others: No Duty to Warn:no Physical Aggression / Violence:No  Access to Firearms a concern: No  Gang Involvement:No   Virtual Visit via VIDEO: I connected with client by MyChart video enabled telemedicine/telehealth application, with their informed consent, and verified client privacy and that I am speaking with the correct person using two identifiers. I discussed the limitations, risks, security and privacy concerns of performing psychotherapy and management service virtually and confirmed their location. I also discussed with the patient that there may be a patient responsible charge related to this service and to confirm with the front desk if their insurance covers teletherapy. I also discussed with the patient the availability of in person appointments. The patient expressed understanding and agreed to proceed. I discussed the treatment planning with the client. The client was provided an opportunity to ask questions and all were answered. The client agreed with the plan and demonstrated an understanding of the instructions. The client was advised to call our office if symptoms  worsen or feel they are in a crisis state and need immediate contact. Client also reminded of a crisis line number and to use 9-1-1 if there's an emergency.  Therapist Location: office; Client Location: home.  Subjective: Client reported about the time she had with her mom and family this week on vacation and reported feeling excluded by her mom and brother always talking about things shes not interested in. Client discussed her childhood being painful in relation to her mom "putting her down when she was little". Therapist used MI & CBT with client to validate her hurt feelings and trauma it caused her while also helping to tease out the thoughts that plague her along with the negative feelings & sadness she feels. Client has good insight in recognizing it was because her mom felt inferior and had her own low self esteem. Client expressed her struggle with PTSD symptoms such as hypervigilance and therapist used RPT with client to help her integrate certain grounding coping skills. Therapist assessed for stability and client denied SI/HI/AVH.  Interventions: Cognitive Behavioral Therapy and Motivational Interviewing & RPT  Diagnosis:   ICD-10-CM   1. Mild episode of recurrent major depressive disorder ([place]  F33.0       Plan of Care: Client to return for weekly therapy with KSammuel Cooper therapist, to review again in 6 months.  Client is to continue seeing medication provider for support of mood management.   Client to engage in CBT: challenging negative internal ruminations and self-talk AEB journaling daily or expressing thoughts to support persons in their life and then challenging it with  truth.  Client to engage in tapping to tap in healthy cognition that challenges negative rumination or deep negative core belief.  Client to practice DBT distress tolerance skills to decrease crying spells and thoughts of not being able to endure their suffering AEB using mindfulness, deep breathing, and TIP to  increase tolerance for discomfort, discharge emotional distress, and increase their understanding that they can do hard things.  Client to utilize BSP (brainspotting) with therapist to help client identify and process triggers for their depressive episode and anxiety with goal of reducing said SUDs caused by depression/anxiety by 33% in the next 6 months.  Client to prioritize sleep 8+ hours each week night AEB going to bed by 10pm each night.   Barnie Del, LCSW, LCAS, CCTP, CCS-I, BSP

## 2020-10-23 NOTE — Progress Notes (Signed)
50 y.o. G27P0000 Married Caucasian female here for annual exam.    Has undergone treatment for breast cancer. On Tamoxifen. Denies vaginal bleeding.   Will do thyroid management through PCP.   PCP:  Sela Hilding, MD  Patient's last menstrual period was 08/07/2015 (approximate).           Sexually active: Yes.    The current method of family planning is post menopausal status.    Exercising: Yes.     Strength, cardio, aerobics, walking Smoker:  no  Health Maintenance: Pap:  06-24-17 Neg:Neg HR HPV  History of abnormal Pap:  no MMG:  08-29-20 Diag.Bil/Neg/BiRads2/Diag.Bil.36yrd/t lumpectomy protocol Colonoscopy:  NEVER BMD:  01-04-19  Result :Osteopenia TDaP: 08-22-15 Gardasil:   no HIV: Neg years ago Hep C: never Screening Labs:  PCP and oncology   reports that she has never smoked. She has never used smokeless tobacco. She reports current alcohol use of about 1.0 standard drink per week. She reports that she does not use drugs.  Past Medical History:  Diagnosis Date   Anxiety    Arthritis    OP   Breast cancer ([place]    Depression    Family history of breast cancer 02/13/2020   History of radiation therapy 04/17/20-06/03/20   IMRT to Left breast Dr. 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  Service: General;  Laterality: Left;   DILATATION & CURETTAGE/HYSTEROSCOPY WITH MYOSURE  N/A 10/07/2015   Procedure: DILATATION & CURETTAGE/HYSTEROSCOPY WITH MYOSURE;  Surgeon: BNunzio Cobbs MD;  [facility];  Service: Gynecology;  Laterality: N/A;  endometrial polyp   WISDOM TOOTH EXTRACTION      Current Outpatient Medications  Medication Sig Dispense Refill   calcium gluconate 500 MG tablet Take 1,000 tablets by mouth daily.     gabapentin (NEURONTIN) 600 MG tablet Take 1 tablet (600 mg total) by mouth at bedtime. 30 tablet 2   levothyroxine (SYNTHROID) 112 MCG tablet Take 1 tablet by mouth daily before breakfast. 90 tablet 0   Multiple Vitamins-Minerals (MULTIVITAMIN PO) Take 1 tablet by mouth daily.     tamoxifen (NOLVADEX) 20 MG tablet Take 1 tablet (20 mg total) by mouth daily. 30 tablet 6   triamcinolone (KENALOG) 0.1 % Apply topically 2 (two) times daily as needed.     venlafaxine (EFFEXOR) 37.5 MG tablet Take 1 tablet (37.5 mg total) by mouth 2 (two) times daily. 90 tablet 4   Vilazodone HCl (VIIBRYD) 20 MG TABS Take 1 tablet (20 mg total) by mouth daily. 30 tablet 1   No current facility-administered medications for this visit.    Family History  Problem Relation Age of Onset   Depression Mother    Multiple sclerosis Father    Other Father 758      right leg amputated below knee   Thyroid disease  Sister        hypothyroidism   Hypertension Maternal Grandmother    Heart disease Maternal Grandmother    Depression Maternal Grandmother    Diabetes Maternal Grandfather    Other Maternal Grandfather        benign brain tumor   Dementia Paternal Grandmother    Alcohol abuse Paternal Grandfather    Prostate cancer Cousin 53       maternal cousin   Colon cancer Other        MGF's brother; dx 60s    Review of Systems  All other systems reviewed and are negative.  Exam:   BP 108/64   Pulse 62   Ht 5' 2.5" (1.588 m)   Wt 154 lb (69.9 kg)   LMP 08/07/2015 (Approximate)   SpO2 97%   BMI 27.72 kg/m     General appearance: alert,  cooperative and appears stated age Head: normocephalic, without obvious abnormality, atraumatic Neck: no adenopathy, supple, symmetrical, trachea midline and thyroid normal to inspection and palpation Lungs: clear to auscultation bilaterally Breasts: scar of left breast, no masses or tenderness,  No nipple discharge or bleeding, No axillary adenopathy Heart: regular rate and rhythm Abdomen: soft, non-tender; no masses, no organomegaly Extremities: extremities normal, atraumatic, no cyanosis or edema Skin: skin color, texture, turgor normal. No rashes or lesions Lymph nodes: cervical, supraclavicular, and axillary nodes normal. Neurologic: grossly normal  Pelvic: External genitalia:  no lesions              No abnormal inguinal nodes palpated.              Urethra:  normal appearing urethra with no masses, tenderness or lesions              Bartholins and Skenes: normal                 Vagina: normal appearing vagina with normal color and discharge, no lesions              Cervix: no lesions              Pap taken: no Bimanual Exam:  Uterus:  normal size, contour, position, consistency, mobility, non-tender              Adnexa: no mass, fullness, tenderness              Rectal exam: yes.  Confirms.              Anus:  normal sphincter tone, no lesions  Chaperone was present for exam:  Estill Bamberg, CMA  Assessment:   Well woman visit with gynecologic exam. Hx ovarian insufficiency.  Left breast cancer.  Status post lumpectomy and XRT.  On Tamoxifen.  Osteopenia.  Hypothyroidism.   Plan: Mammogram screening discussed. Self breast awareness reviewed. Pap and HR HPV 2024. Guidelines for Calcium, Vitamin D, regular exercise program including cardiovascular and weight bearing exercise. We discussed effect of tamoxifen on the endometrium and potential risk of endometrial cancer.  She knows to call for any vaginal bleeding.  Labs with PCP and oncology. BMD this fall.  Follow up annually  and prn.   After visit summary provided.

## 2020-10-23 NOTE — Patient Instructions (Signed)

## 2020-10-28 ENCOUNTER — Ambulatory Visit: Payer: 59 | Admitting: Addiction (Substance Use Disorder)

## 2020-11-04 ENCOUNTER — Ambulatory Visit (INDEPENDENT_AMBULATORY_CARE_PROVIDER_SITE_OTHER): Payer: 59 | Admitting: Addiction (Substance Use Disorder)

## 2020-11-04 ENCOUNTER — Other Ambulatory Visit: Payer: Self-pay

## 2020-11-04 DIAGNOSIS — F330 Major depressive disorder, recurrent, mild: Secondary | ICD-10-CM

## 2020-11-04 DIAGNOSIS — F4311 Post-traumatic stress disorder, acute: Secondary | ICD-10-CM | POA: Diagnosis not present

## 2020-11-04 NOTE — Progress Notes (Signed)
[facility] Counselor/Therapist Progress Note  Patient ID: Michaela Roman, MRN: [id number],    Date: 11/05/2020  Time Spent: 71mns  Treatment Type: Individual Therapy  Reported Symptoms: excitement but still having trouble sleeping  Mental Status Exam:  Appearance:   Casual     Behavior:  Appropriate and Sharing  Motor:  Normal  Speech/Language:   Clear and Coherent and Normal Rate  Affect:  Appropriate and Congruent  Mood:  anxious and euphoric   Thought process:  normal  Thought content:    Obsessions and Rumination  Sensory/Perceptual disturbances:    WNL  Orientation:  x4  Attention:  Good  Concentration:  Good  Memory:  WNL  Fund of knowledge:   Good  Insight:    Good  Judgment:   Good  Impulse Control:  Fair   Risk Assessment: Danger to Self:  No. Denied.  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 Client's confidence is growing in her ability to write and to teach others how to refine that skill as well. Client processed more about her thoughts of self and therapist used MI & CBT with client to validate and affirm her strengths and progress while also helping her to challenge any negative self-talk/irrational thoughts. Client processed some issues with sleeping and still feeling "keyed up" at times physically and not feeling she is thinking about something anxiety-provoking. Therapist normalized this and reminded client that acute PTSD takes some time and work to change/progress/heal from. Therapist and client talked about new modality of trauma treatment: [facility] and how she can integrate this into her life. Client looking for something to calm and regulate her nervous  system and has been using mindfulness but is looking for something more intended to help heal some damage caused by trauma. Therapist and client engaged in mindfulness and processed client's intent to continue implementing this into her life daily as a form of regulation/containment. Therapist assessed for stability and client denied SI/HI/AVH.  Interventions: Cognitive Behavioral Therapy, Mindfulness Meditation, Motivational Interviewing, and RPT    Diagnosis:   ICD-10-CM   1. Mild episode of recurrent major depressive disorder (HCC)  F33.0     2. Acute posttraumatic stress disorder  F43.11       Plan of Care: Client to return for weekly therapy with KSammuel Cooper therapist, to review again in 6 months.  Client is to continue seeing medication provider for support of mood management.   Client to engage in CBT: challenging negative internal ruminations and self-talk AEB journaling daily or expressing thoughts to support persons in their life and then challenging it with truth.  Client to engage in tapping to tap in healthy cognition that challenges negative rumination or deep negative core belief.  Client to practice DBT distress tolerance skills to decrease crying spells and thoughts of not being able to endure their suffering AEB using mindfulness, deep breathing, and TIP to increase tolerance for discomfort, discharge emotional distress, and increase their understanding that they can do hard things.  Client to utilize BSP (brainspotting) with therapist to help client identify and process triggers for their depressive episode and anxiety with goal of reducing said SUDs caused by depression/anxiety by 33% in the next 6  months.  Client to prioritize sleep 8+ hours each week night AEB going to bed by 10pm each night.   Barnie Del, LCSW, LCAS, CCTP, CCS-I, BSP

## 2020-11-06 ENCOUNTER — Telehealth: Payer: Self-pay

## 2020-11-06 NOTE — Telephone Encounter (Signed)
Prior Authorization submitted on 10/28/2020 for VILAZODONE 20 MG with [employer], approval received effective 10/28/2020-10/28/2021   ID# [id number]

## 2020-11-12 ENCOUNTER — Other Ambulatory Visit: Payer: Self-pay

## 2020-11-12 ENCOUNTER — Ambulatory Visit (INDEPENDENT_AMBULATORY_CARE_PROVIDER_SITE_OTHER): Payer: 59 | Admitting: Addiction (Substance Use Disorder)

## 2020-11-12 DIAGNOSIS — F330 Major depressive disorder, recurrent, mild: Secondary | ICD-10-CM | POA: Diagnosis not present

## 2020-11-12 DIAGNOSIS — F4311 Post-traumatic stress disorder, acute: Secondary | ICD-10-CM

## 2020-11-12 NOTE — Progress Notes (Signed)
      [facility] Counselor/Therapist Progress Note  Patient ID: CHETANA SCOPEL, MRN: [id number],    Date: 11/12/2020  Time Spent: 63mns  Treatment Type: Individual Therapy  Reported Symptoms: upset, despairing, tearful  Mental Status Exam:  Appearance:   Casual     Behavior:  Appropriate and Sharing  Motor:  Normal  Speech/Language:   Clear and Coherent and Normal Rate  Affect:  Appropriate and Congruent  Mood:  anxious, depressed, and sad   Thought process:  normal  Thought content:    Obsessions and Rumination  Sensory/Perceptual disturbances:    WNL  Orientation:  x4  Attention:  Good  Concentration:  Good  Memory:  WNL  Fund of knowledge:   Good  Insight:    Good  Judgment:   Fair  Impulse Control:  Fair   Risk Assessment: Danger to Self:  No. Denied.  Self-injurious Behavior: No Danger to Others: No Duty to Warn:no Physical Aggression / Violence:No  Access to Firearms a concern: No  Gang Involvement:No    Subjective: Client came in very tearful and upset about her continued issues with her depression and acute stress. Client reported having ongoing trouble with irritability and insomnia. Client reported feeling alone in dealing with these emotional battles and very triggered from having to tell others about her cancer struggle. Client not ready to process that with friends or doctors and therapist normalized that for client, using MI & validating her feelings. Therapist also used roleplaying with client to help her feel more comfortable only telling what she wants about her cancer journey/struggle. Client tearfully processed feeling overwhelmed and feeling discouraged about her ongoing struggles with suffering- processing where she was in her grief. Therapist provided unconditional positive regard for client while processing her suffering and therapist assessed for stability. Client denied SI/HI/AVH.  Interventions: Cognitive Behavioral Therapy, Roleplay,  Motivational Interviewing, Grief Therapy, and RPT    Diagnosis:   ICD-10-CM   1. Mild episode of recurrent major depressive disorder ([place]  F33.0     2. Acute posttraumatic stress disorder  F43.11        Plan of Care: Client to return for weekly therapy with KSammuel Cooper therapist, to review again in 6 months.  Client is to continue seeing medication provider for support of mood management.   Client to engage in CBT: challenging negative internal ruminations and self-talk AEB journaling daily or expressing thoughts to support persons in their life and then challenging it with truth.  Client to engage in tapping to tap in healthy cognition that challenges negative rumination or deep negative core belief.  Client to practice DBT distress tolerance skills to decrease crying spells and thoughts of not being able to endure their suffering AEB using mindfulness, deep breathing, and TIP to increase tolerance for discomfort, discharge emotional distress, and increase their understanding that they can do hard things.  Client to utilize BSP (brainspotting) with therapist to help client identify and process triggers for their depressive episode and anxiety with goal of reducing said SUDs caused by depression/anxiety by 33% in the next 6 months.  Client to prioritize sleep 8+ hours each week night AEB going to bed by 10pm each night.   KBarnie Del LCSW, LCAS, CCTP, CCS-I, BSP

## 2020-11-14 ENCOUNTER — Other Ambulatory Visit: Payer: Self-pay

## 2020-11-14 ENCOUNTER — Encounter: Payer: Self-pay | Admitting: Behavioral Health

## 2020-11-14 ENCOUNTER — Ambulatory Visit (INDEPENDENT_AMBULATORY_CARE_PROVIDER_SITE_OTHER): Payer: 59 | Admitting: Behavioral Health

## 2020-11-14 DIAGNOSIS — F4311 Post-traumatic stress disorder, acute: Secondary | ICD-10-CM

## 2020-11-14 DIAGNOSIS — F5105 Insomnia due to other mental disorder: Secondary | ICD-10-CM | POA: Diagnosis not present

## 2020-11-14 DIAGNOSIS — F330 Major depressive disorder, recurrent, mild: Secondary | ICD-10-CM

## 2020-11-14 DIAGNOSIS — F99 Mental disorder, not otherwise specified: Secondary | ICD-10-CM

## 2020-11-14 DIAGNOSIS — F411 Generalized anxiety disorder: Secondary | ICD-10-CM

## 2020-11-14 DIAGNOSIS — F4323 Adjustment disorder with mixed anxiety and depressed mood: Secondary | ICD-10-CM

## 2020-11-14 MED ORDER — ESZOPICLONE 1 MG PO TABS: 1.0000 mg | ORAL_TABLET | Freq: Every evening | ORAL | 1 refills | Status: DC | PRN

## 2020-11-14 NOTE — Progress Notes (Signed)
[facility] Med Check  Patient ID: Michaela Roman,  MRN: [id number]  PCP: Glenis Smoker, MD  Date of Evaluation: 11/14/2020 Time spent:30 minutes  Chief Complaint:  Chief Complaint   Anxiety; Depression; Follow-up; Medication Refill     HISTORY/CURRENT STATUS: HPI  50 year old female presents to this office for follow up and medication management. She is now completely weaned off Effexor and taking Viibryd 20 mg daily. She says that she has experienced modest improvement with anxiety and depression. She does not want to adjust dosage at this time. She says she understands that she is on Tamoxifen for estrogen reduction and that some antidepressants can interfere. Research has shown that [place] is also safer choice. She is reporting anxiety today at 3/10 and depression at 3/10. She does report continued broken sleep and would like to consider medication at this point to assist her.   No mania, no psychosis. No SI/HI.      Wellbutrin- Insomnia, racing thoughts Stopped after 2 weeks. Zoloft- Took for a few weeks. Had insomnia. Developed hives.  Lexapro Lorazepam- Had a calming effect. Not effective with decreasing middle of the night awakenings. Melatonin- Vivid dreams Trazodone- Felt sedated but did not sleep.           Individual Medical History/ Review of Systems: Changes? :No   Allergies: Sertraline  Current Medications:  Current Outpatient Medications:    eszopiclone (LUNESTA) 1 MG TABS tablet, Take 1 tablet (1 mg total) by mouth at bedtime as needed for sleep. Take immediately before bedtime, Disp: 30 tablet, Rfl: 1   calcium gluconate 500 MG tablet, Take 1,000 tablets by mouth daily., Disp: , Rfl:    gabapentin (NEURONTIN) 600 MG tablet, Take 1 tablet (600 mg total) by mouth at bedtime., Disp: 30 tablet, Rfl: 2   levothyroxine (SYNTHROID) 112 MCG tablet, Take 1 tablet by mouth daily before breakfast., Disp: 90 tablet, Rfl: 0   Multiple  Vitamins-Minerals (MULTIVITAMIN PO), Take 1 tablet by mouth daily., Disp: , Rfl:    tamoxifen (NOLVADEX) 20 MG tablet, Take 1 tablet (20 mg total) by mouth daily., Disp: 30 tablet, Rfl: 6   triamcinolone (KENALOG) 0.1 %, Apply topically 2 (two) times daily as needed., Disp: , Rfl:    venlafaxine (EFFEXOR) 37.5 MG tablet, Take 1 tablet (37.5 mg total) by mouth 2 (two) times daily., Disp: 90 tablet, Rfl: 4   Vilazodone HCl (VIIBRYD) 20 MG TABS, Take 1 tablet (20 mg total) by mouth daily., Disp: 30 tablet, Rfl: 1 Medication Side Effects: none  Family Medical/ Social History: Changes? No  MENTAL HEALTH EXAM:  Last menstrual period 08/07/2015.There is no height or weight on file to calculate BMI.  General Appearance: Casual and Neat  Eye Contact:  Fair  Speech:  Clear and Coherent  Volume:  Normal  Mood:  Depressed and Dysphoric  Affect:  Flat  Thought Process:  Coherent  Orientation:  Full (Time, Place, and Person)  Thought Content: Logical   Suicidal Thoughts:  No  Homicidal Thoughts:  No  Memory:  WNL  Judgement:  Good  Insight:  Good  Psychomotor Activity:  Normal  Concentration:  Concentration: Good  Recall:  Good  Fund of Knowledge: Good  Language: Good  Assets:  Desire for Improvement Physical Health Resilience  ADL's:  Intact  Cognition: WNL  Prognosis:  Good    DIAGNOSES:    ICD-10-CM   1. Mild episode of recurrent major depressive disorder (HCC)  F33.0     2.  Insomnia due to other mental disorder  F51.05 eszopiclone (LUNESTA) 1 MG TABS tablet   F99     3. GAD (generalized anxiety disorder)  F41.1     4. Adjustment disorder with mixed anxiety and depressed mood  F43.23     5. Acute posttraumatic stress disorder  F43.11       Receiving Psychotherapy: Yes    RECOMMENDATIONS:  Patient has completed weaning off Effexor   Continue Viibryd 20 mg daily  Start Lunesta 1 mg at bedtime   To continue Gabapentin 300 mg 1-2 capsules daily   Will report any  worsening symptoms promptly   To follow up in 2 months to reassess.  Provided emergency contact info   Greater than 50% of 30 face to face time with patient was spent on counseling and coordination of care.  We discussed her recent improvement with anxiety and depression but she believes Viibryd has given her excessive gas. She has now completed 3 full weeks of Viibryd 20 mg and is willing to continue to see if this improves. She says that it seems to be getting better. We did agree that her continued poor sleep cycle needed attention and she has limited choices with her current insurance. We agreed to try Lunesta to see if this would help her sleep throughout the night. Provided education on good sleep hygiene and to take the medication only when ready to sleep. Reinforced the need for at least 7-8 hours commitment when taking the medication.     Elwanda Brooklyn, NP

## 2020-11-18 ENCOUNTER — Ambulatory Visit (INDEPENDENT_AMBULATORY_CARE_PROVIDER_SITE_OTHER): Payer: 59 | Admitting: Addiction (Substance Use Disorder)

## 2020-11-18 ENCOUNTER — Other Ambulatory Visit: Payer: Self-pay

## 2020-11-18 DIAGNOSIS — F4311 Post-traumatic stress disorder, acute: Secondary | ICD-10-CM

## 2020-11-18 NOTE — Progress Notes (Signed)
      [facility] Counselor/Therapist Progress Note  Patient ID: Michaela Roman, MRN: [id number],    Date: 11/18/2020  Time Spent: 71mins  Treatment Type: Individual Therapy  Reported Symptoms: tearful, frustrated, insomnia  Mental Status Exam:  Appearance:   Casual     Behavior:  Appropriate and Sharing  Motor:  Normal  Speech/Language:   Clear and Coherent and Normal Rate  Affect:  Appropriate and Congruent  Mood:  sad   Thought process:  normal  Thought content:    Obsessions and Rumination  Sensory/Perceptual disturbances:    WNL  Orientation:  x4  Attention:  Good  Concentration:  Good  Memory:  WNL  Fund of knowledge:   Good  Insight:    Good  Judgment:   Fair  Impulse Control:  Fair   Risk Assessment: Danger to Self:  No. Denied.  Self-injurious Behavior: No Danger to Others: No Duty to Warn:no Physical Aggression / Violence:No  Access to Firearms a concern: No  Gang Involvement:No    Subjective: Client came in very upset and feeling frustrated due to not sleeping from 3am on every single day. Client wanting to go off her medications because they are all "failing her". Therapist used MI, CBT with client to validate her frustrations, help her process through rational thinking, and to help her grieve the changes in her life and symptoms shes experiencing as a result of trauma. Client shared shes reading a Anti-Cancer living book that helped her understand why it may not have been a good idea to go on her Klonopin that she thins she is having withdraws from. Client reported having lot of fear of "not doing things right" and desires a way to "let go of her rituals and overthinking". Therapist used MI & psychoeducation to help her realize thinking mistakes and help her not engage in safety behaviors, causing the cycles to continue. Therapist assessed for stability. Client denied SI/HI/AVH.  Interventions: Cognitive Behavioral Therapy, Motivational Interviewing,  Grief Therapy, Psycho-education/Bibliotherapy, and RPT    Diagnosis: No diagnosis found.   Plan of Care: Client to return for weekly therapy with Sammuel Cooper, therapist, to review again in 6 months.  Client is to continue seeing medication provider for support of mood management.   Client to engage in CBT: challenging negative internal ruminations and self-talk AEB journaling daily or expressing thoughts to support persons in their life and then challenging it with truth.  Client to engage in tapping to tap in healthy cognition that challenges negative rumination or deep negative core belief.  Client to practice DBT distress tolerance skills to decrease crying spells and thoughts of not being able to endure their suffering AEB using mindfulness, deep breathing, and TIP to increase tolerance for discomfort, discharge emotional distress, and increase their understanding that they can do hard things.  Client to utilize BSP (brainspotting) with therapist to help client identify and process triggers for their depressive episode and anxiety with goal of reducing said SUDs caused by depression/anxiety by 33% in the next 6 months.  Client to prioritize sleep 8+ hours each week night AEB going to bed by 10pm each night.   Barnie Del, LCSW, LCAS, CCTP, CCS-I, BSP

## 2020-11-20 ENCOUNTER — Telehealth: Payer: Self-pay | Admitting: Behavioral Health

## 2020-11-20 NOTE — Telephone Encounter (Signed)
Pharmacy called and said that the pt has a script of gabapentin from brian and another script of the same med from a different doctor. They need clarification on whether to fill the medicine and to make brian aware of the same script from different provider. Please call pharmacy

## 2020-11-20 NOTE — Telephone Encounter (Signed)
Please review

## 2020-11-21 ENCOUNTER — Other Ambulatory Visit: Payer: Self-pay | Admitting: Behavioral Health

## 2020-11-21 NOTE — Telephone Encounter (Signed)
Please contact pt and tell her that Im going to continue to let her other doctor handle the gabapentin and I will be canceling script.

## 2020-11-21 NOTE — Telephone Encounter (Signed)
LVM with info

## 2020-11-27 ENCOUNTER — Ambulatory Visit (INDEPENDENT_AMBULATORY_CARE_PROVIDER_SITE_OTHER): Payer: 59 | Admitting: Addiction (Substance Use Disorder)

## 2020-11-27 ENCOUNTER — Other Ambulatory Visit: Payer: Self-pay

## 2020-11-27 DIAGNOSIS — F4311 Post-traumatic stress disorder, acute: Secondary | ICD-10-CM | POA: Diagnosis not present

## 2020-11-27 NOTE — Progress Notes (Signed)
      [facility] Counselor/Therapist Progress Note  Patient ID: SHANIN SZYMANOWSKI, MRN: [id number],    Date: 11/27/2020  Time Spent: 50min  Treatment Type: Individual Therapy  Reported Symptoms: anxious  Mental Status Exam:  Appearance:   Casual     Behavior:  Appropriate and Sharing  Motor:  Normal  Speech/Language:   Clear and Coherent and Normal Rate  Affect:  Appropriate and Congruent  Mood:  anxious and labile   Thought process:  normal  Thought content:    Obsessions and Rumination  Sensory/Perceptual disturbances:    WNL  Orientation:  x4  Attention:  Good  Concentration:  Good  Memory:  WNL  Fund of knowledge:   Good  Insight:    Good  Judgment:   Good  Impulse Control:  Good   Risk Assessment: Danger to Self:  No. Denied.  Self-injurious Behavior: No Danger to Others: No Duty to Warn:no Physical Aggression / Violence:No  Access to Firearms a concern: No  Gang Involvement:No    Subjective: Client came in reporting ongoing anxiety and some sleep trouble. Client reported being ready to use a safe and sound program to help her learn to regulate her emotions. Therapist used MI & DBT with client to validate her feelings and also her readiness to start the regulation program. Therapist talked with client about how to build distress tolerance to help her body feel more at ease and help her regulate her emotions. Therapist also provided psychedulation about the vagus nerve and gentle ways to self regulate while also increasing her distress tolerance: such as with the safe and sound program using the polyvagal therory. Client understood the ways she could engage with this and will communicate with the therapist about her wellbeing and how she feels. Therapist assessed for stability, & client denied SI/HI/AVH.  Interventions: Dialectical Behavioral Therapy, Motivational Interviewing, Psycho-education/Bibliotherapy, and RPT    Diagnosis:   ICD-10-CM   1. Acute  posttraumatic stress disorder  F43.11        Plan of Care: Client to return for weekly therapy with Sammuel Cooper, therapist, to review again in 6 months.  Client is to continue seeing medication provider for support of mood management.   Client to engage in CBT: challenging negative internal ruminations and self-talk AEB journaling daily or expressing thoughts to support persons in their life and then challenging it with truth.  Client to engage in tapping to tap in healthy cognition that challenges negative rumination or deep negative core belief.  Client to practice DBT distress tolerance skills to decrease crying spells and thoughts of not being able to endure their suffering AEB using mindfulness, deep breathing, and TIP to increase tolerance for discomfort, discharge emotional distress, and increase their understanding that they can do hard things.  Client to utilize BSP (brainspotting) with therapist to help client identify and process triggers for their depressive episode and anxiety with goal of reducing said SUDs caused by depression/anxiety by 33% in the next 6 months.  Client to prioritize sleep 8+ hours each week night AEB going to bed by 10pm each night.   Barnie Del, LCSW, LCAS, CCTP, CCS-I, BSP

## 2020-12-02 ENCOUNTER — Other Ambulatory Visit: Payer: Self-pay

## 2020-12-02 ENCOUNTER — Ambulatory Visit (INDEPENDENT_AMBULATORY_CARE_PROVIDER_SITE_OTHER): Payer: 59 | Admitting: Addiction (Substance Use Disorder)

## 2020-12-02 DIAGNOSIS — F4311 Post-traumatic stress disorder, acute: Secondary | ICD-10-CM

## 2020-12-02 NOTE — Progress Notes (Signed)
      [facility] Counselor/Therapist Progress Note  Patient ID: DAE ANTONUCCI, MRN: [id number],    Date: 12/02/2020  Time Spent: 13mins  Treatment Type: Individual Therapy  Reported Symptoms: hypervigilent  Mental Status Exam:  Appearance:   Casual     Behavior:  Appropriate and Sharing  Motor:  Normal  Speech/Language:   Clear and Coherent and Normal Rate  Affect:  Appropriate and Congruent  Mood:  anxious and labile   Thought process:  normal  Thought content:    Obsessions and Rumination  Sensory/Perceptual disturbances:    WNL  Orientation:  x4  Attention:  Good  Concentration:  Good  Memory:  WNL  Fund of knowledge:   Good  Insight:    Good  Judgment:   Good  Impulse Control:  Good   Risk Assessment: Danger to Self:  No. Denied.  Self-injurious Behavior: No Danger to Others: No Duty to Warn:no Physical Aggression / Violence:No  Access to Firearms a concern: No  Gang Involvement:No    Subjective: Client reported feeling anxious still and hypervigilent.Therapist had client complete the PTSD checklist and client tested moderate to high on multiple symptoms. Client reported being in need of regulation for her nervous system and therapist used MI to affirm her progress but support her in making further progress. Therapist used Mindfulness and S&SP to help client engage in regulating sound therapy and guided meditation. Client made progress in session and reported feeling like she wanted to go home and rest after the session due to feeling more in tune with her body. Client and therapist used RPT to review a plan for her coping to regulate her self. Therapist assessed for stability, & client denied SI/HI/AVH.  Interventions: Mindfulness Meditation, Motivational Interviewing, and RPT    Diagnosis: No diagnosis found.   Plan of Care: Client to return for weekly therapy with Sammuel Cooper, therapist, to review again in 6 months.  Client is to continue seeing  medication provider for support of mood management.   Client to engage in CBT: challenging negative internal ruminations and self-talk AEB journaling daily or expressing thoughts to support persons in their life and then challenging it with truth.  Client to engage in tapping to tap in healthy cognition that challenges negative rumination or deep negative core belief.  Client to practice DBT distress tolerance skills to decrease crying spells and thoughts of not being able to endure their suffering AEB using mindfulness, deep breathing, and TIP to increase tolerance for discomfort, discharge emotional distress, and increase their understanding that they can do hard things.  Client to utilize BSP (brainspotting) with therapist to help client identify and process triggers for their depressive episode and anxiety with goal of reducing said SUDs caused by depression/anxiety by 33% in the next 6 months.  Client to prioritize sleep 8+ hours each week night AEB going to bed by 10pm each night.   Barnie Del, LCSW, LCAS, CCTP, CCS-I, BSP

## 2020-12-04 ENCOUNTER — Telehealth: Payer: Self-pay | Admitting: Oncology

## 2020-12-04 NOTE — Telephone Encounter (Signed)
Scheduled per sch msg. Called and left msg  

## 2020-12-08 ENCOUNTER — Other Ambulatory Visit: Payer: Self-pay

## 2020-12-08 ENCOUNTER — Ambulatory Visit: Payer: 59 | Attending: Surgery

## 2020-12-08 VITALS — Wt 153.4 lb

## 2020-12-08 DIAGNOSIS — Z483 Aftercare following surgery for neoplasm: Secondary | ICD-10-CM | POA: Insufficient documentation

## 2020-12-08 NOTE — Therapy (Signed)
[facility] @ [place], [place], [address] Phone: [phone]   Fax:  [phone]  Physical Therapy Treatment  Patient Details  Name: Michaela Roman MRN: [id number] Date of Birth: 01-18-1971 Referring Provider (PT): Dr. Erroll Luna   Encounter Date: 12/08/2020   PT End of Session - 12/08/20 0946     Visit Number 10   # unchanged due to screen only   PT Start Time 0935    PT Stop Time 0945    PT Time Calculation (min) 10 min    Activity Tolerance Patient tolerated treatment well    Behavior During Therapy [facility] for tasks assessed/performed             Past Medical History:  Diagnosis Date   Anxiety    Arthritis    OP   Breast cancer ([place])    Depression    Family history of breast cancer 02/13/2020   History of radiation therapy 04/17/20-06/03/20   IMRT to Left breast Dr. 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   Personal history of radiation therapy    Premature ovarian failure    Dx'd age 51   Thyroid disease    hypothyroid    Past Surgical History:  Procedure Laterality Date   BREAST BIOPSY Left 02/04/2020   breast and lymphnode   BREAST LUMPECTOMY Left 03/12/2020   BREAST LUMPECTOMY WITH RADIOACTIVE SEED AND SENTINEL LYMPH NODE BIOPSY Left 03/12/2020   Procedure: LEFT BREAST LUMPECTOMY WITH RADIOACTIVE SEED AND LEFT TARGETED RADIOACTIVE SEED LYMPH NODE BIOPSY AND LEFT SENTINEL LYMPH NODE MAPPING;  Surgeon: Erroll Luna, MD;  Location: [facility];  Service: General;  Laterality: Left;   [place] N/A 10/07/2015   Procedure: DILATATION & CURETTAGE/HYSTEROSCOPY WITH MYOSURE;  Surgeon: Nunzio Cobbs, MD;  Location: [facility];  Service: Gynecology;  Laterality: N/A;  endometrial polyp   WISDOM TOOTH EXTRACTION      Vitals:   12/08/20 0936  Weight: 153 lb 6 oz (69.6 kg)     Subjective  Assessment - 12/08/20 0936     Subjective Pt returns for her 3 month L-Dex screen.    Pertinent History Patient was diagnosed on 01/23/2020 with left 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 Patient underwent a left lumpectomy and sentinel node biopsy (1/4 positive) on 03/12/2020. It is ER/PR positive and HER2 negative with a Ki67 of 30%.                    L-DEX FLOWSHEETS - 12/08/20 0900       L-DEX LYMPHEDEMA SCREENING   Measurement Type Unilateral    L-DEX MEASUREMENT EXTREMITY Upper Extremity    POSITION  Standing    DOMINANT SIDE Right    At Risk Side Left    BASELINE SCORE (UNILATERAL) -0.7    L-DEX SCORE (UNILATERAL) -1.7    VALUE CHANGE (UNILAT) -1                                     PT Long Term Goals - 06/04/20 1534       PT LONG TERM GOAL #1   Title Patient will demonstrate she has regained full shoulder ROM and function post operatively compared to baselines.    Time 4    Period  Weeks    Status Achieved      PT LONG TERM GOAL #2   Title Patient will increase left shoulder flexion to >/= 150 degrees for increased ease reaching.    Time 4    Period Weeks    Status Achieved      PT LONG TERM GOAL #3   Title Patient will increase left shoulder abduction to >/= 165 degrees for increased ease reaching.    Time 4    Period Weeks    Status Achieved      PT LONG TERM GOAL #4   Title Patient will improve her DASH score to be </= 10 for improved overall uppre extremity function.    Baseline 0 today    Time 4    Period Weeks    Status Achieved      PT LONG TERM GOAL #5   Title Patient will report >/= 50% improvement in left axillary cording.    Time 4    Period Weeks    Status Achieved      PT LONG TERM GOAL #6   Title Pt will be fit for class 1 sleeve/gauntlet and will demonstrate proper donning/doffing and review times to wear    Period Weeks    Status Achieved      PT LONG TERM GOAL #7   Title Pt will  return demonstrate proper Self MLD to left breast    Time 1    Status Achieved      PT LONG TERM GOAL #8   Title Pt will be educated in [facility] strength program and will be independent in its performance    Time 6    Period Weeks    Status Achieved      PT LONG TERM GOAL  #9   TITLE Pt will maintain full shoulder ROM as she progresses through radiation    Time 6    Status Achieved                   Plan - 12/08/20 0946     Clinical Impression Statement Pt returns for her 3 month L-Dex screen. Her change from baseline of -1 is WNLs so no further treatment is required at this time except to cont every 3 month L-Dex sreens which pt is agreeable to. Answered pts questions about what to expect during healing from radiation over next few months as she is at risk of developing breast lymphedema. Encuraged her to cont self mLD as she was instructed here at our clinic a few times a week, and also after increased activity. Pt verbalized understanding.    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

## 2020-12-09 ENCOUNTER — Ambulatory Visit (INDEPENDENT_AMBULATORY_CARE_PROVIDER_SITE_OTHER): Payer: 59 | Admitting: Addiction (Substance Use Disorder)

## 2020-12-09 DIAGNOSIS — F4311 Post-traumatic stress disorder, acute: Secondary | ICD-10-CM

## 2020-12-09 NOTE — Progress Notes (Signed)
      [facility] Counselor/Therapist Progress Note  Patient ID: Michaela Roman, MRN: [id number],    Date: 12/09/2020  Time Spent: 57mins  Treatment Type: Individual Therapy  Reported Symptoms: sad/ frustrated  Mental Status Exam:  Appearance:   Casual     Behavior:  Appropriate and Sharing  Motor:  Normal  Speech/Language:   Clear and Coherent and Normal Rate  Affect:  Appropriate and Congruent  Mood:  irritable, labile, and sad   Thought process:  normal  Thought content:    Obsessions and Rumination  Sensory/Perceptual disturbances:    WNL  Orientation:  x4  Attention:  Good  Concentration:  Good  Memory:  WNL  Fund of knowledge:   Good  Insight:    Good  Judgment:   Good  Impulse Control:  Good   Risk Assessment: Danger to Self:  No. Denied.  Self-injurious Behavior: No Danger to Others: No Duty to Warn:no Physical Aggression / Violence:No  Access to Firearms a concern: No  Gang Involvement:No   Subjective: Client reported feeling frustrated and irritated that others dont understand her grief after having breast cancer. Client felt lonely and sad her husband didn't come with her to treatments and feels a lot of frustration in her chest. Therapist used MI & BSP with client to help her reduce SUDs from a 10/10. Client made progress having compassion for herself and where she is/ how she feels grief around her battle with cancer. Client's SUDs reduced to 2/10 and held herself in a compassionate and understanding way. Therapist also used MI to validate client's feelings and support her in meeting herself where she is. Therapist assessed for stability, & client denied SI/HI/AVH.  Interventions: Mindfulness Meditation, Motivational Interviewing, and RPT &BSP    Diagnosis:   ICD-10-CM   1. Acute posttraumatic stress disorder  F43.11        Plan of Care: Client to return for weekly therapy with Sammuel Cooper, therapist, to review again in 6 months.  Client is to  continue seeing medication provider for support of mood management.   Client to engage in CBT: challenging negative internal ruminations and self-talk AEB journaling daily or expressing thoughts to support persons in their life and then challenging it with truth.  Client to engage in tapping to tap in healthy cognition that challenges negative rumination or deep negative core belief.  Client to practice DBT distress tolerance skills to decrease crying spells and thoughts of not being able to endure their suffering AEB using mindfulness, deep breathing, and TIP to increase tolerance for discomfort, discharge emotional distress, and increase their understanding that they can do hard things.  Client to utilize BSP (brainspotting) with therapist to help client identify and process triggers for their depressive episode and anxiety with goal of reducing said SUDs caused by depression/anxiety by 33% in the next 6 months.  Client to prioritize sleep 8+ hours each week night AEB going to bed by 10pm each night.   Barnie Del, LCSW, LCAS, CCTP, CCS-I, BSP

## 2020-12-10 ENCOUNTER — Inpatient Hospital Stay: Payer: 59 | Attending: Adult Health | Admitting: Oncology

## 2020-12-10 ENCOUNTER — Other Ambulatory Visit: Payer: Self-pay

## 2020-12-10 VITALS — BP 103/56 | HR 53 | Temp 98.1°F | Resp 18 | Ht 62.5 in | Wt 155.5 lb

## 2020-12-10 DIAGNOSIS — Z79899 Other long term (current) drug therapy: Secondary | ICD-10-CM | POA: Diagnosis not present

## 2020-12-10 DIAGNOSIS — M8580 Other specified disorders of bone density and structure, unspecified site: Secondary | ICD-10-CM | POA: Insufficient documentation

## 2020-12-10 DIAGNOSIS — C50912 Malignant neoplasm of unspecified site of left female breast: Secondary | ICD-10-CM | POA: Insufficient documentation

## 2020-12-10 DIAGNOSIS — Z923 Personal history of irradiation: Secondary | ICD-10-CM | POA: Insufficient documentation

## 2020-12-10 DIAGNOSIS — Z79810 Long term (current) use of selective estrogen receptor modulators (SERMs): Secondary | ICD-10-CM | POA: Insufficient documentation

## 2020-12-10 DIAGNOSIS — C50412 Malignant neoplasm of upper-outer quadrant of left female breast: Secondary | ICD-10-CM | POA: Diagnosis not present

## 2020-12-10 DIAGNOSIS — Z170 Estrogen receptor positive status [ER+]: Secondary | ICD-10-CM | POA: Insufficient documentation

## 2020-12-10 DIAGNOSIS — E039 Hypothyroidism, unspecified: Secondary | ICD-10-CM | POA: Diagnosis not present

## 2020-12-10 MED ORDER — TAMOXIFEN CITRATE 20 MG PO TABS: 20.0000 mg | ORAL_TABLET | Freq: Every day | ORAL | 6 refills | Status: DC

## 2020-12-10 NOTE — Progress Notes (Signed)
[facility]  [phone] [phone]     ID: Michaela Roman DOB: Jul 17, 1970  MR#: [id number]  DZH#:[id number]  Patient Care Team: Glenis Smoker, MD as PCP - General (Family Medicine) Mauro Kaufmann, RN as Oncology Nurse Navigator Rockwell Germany, RN as Oncology Nurse Navigator Erroll Luna, MD as Consulting Physician (General Surgery) Gricel Copen, Virgie Dad, MD as Consulting Physician (Oncology) Gery Pray, MD as Consulting Physician (Radiation Oncology) Nunzio Cobbs, MD as Consulting Physician (Obstetrics and Gynecology) Chauncey Cruel, MD OTHER MD:   CHIEF COMPLAINT: Estrogen receptor positive breast cancer  CURRENT TREATMENT: tamoxifen   INTERVAL HISTORY: [name] was contacted today for follow up of her estrogen receptor positive breast cancer.   Since her last visit, she completed radiation therapy on 06/03/2020.  She started tamoxifen on 06/29/2020.  She was having some hot flashes and we wrote for Effexor for her but she discontinued that and is currently on vilazodone.  I have checked with up-to-date drug interactions and there do not appear to be any with tamoxifen.  She is taking gabapentin at night and that seems to be helping some.  Despite that she has some trouble falling asleep sometimes.  She frequently wakes up at 2 or 3 in the morning and has difficulty going back to sleep.  She always gets out of bed at the same time every morning which is very good and they also keep the room cold which is also helpful.  She underwent bilateral breast mammogram on 08/29/2020 that demonstrated no evidence of malignancy and breast density category B.     REVIEW OF SYSTEMS: She is very active and does yoga, intensive training, and strength exercises as well as biking regularly.  Her diagnosis anniversary came up and she felt somewhat anxious about that but this was minor and not disabling.  She had questions as to  whether she is considered cured at this point.  A detailed review of systems was otherwise stable.     COVID 19 VACCINATION STATUS: Status post Pfizer x2 with boosters x2 most recent September 2022   HISTORY OF CURRENT ILLNESS: From the original intake note:  Michaela Roman herself palpated a left breast lump. Of note, annual screening mammogram from 08/27/2019 was negative. She underwent left diagnostic mammography with tomography and left breast ultrasonography at [facility] on 01/23/2020 showing: breast density category B; palpable 1.3 cm mass in left breast at 2 o'clock that extends to skin; one borderline abnormal left axillary lymph node.  Accordingly on 02/04/2020 she proceeded to biopsy of the left breast area in question. The pathology from this procedure ([id number]) showed: invasive ductal carcinoma, grade 2. Prognostic indicators significant for: estrogen receptor, 80% positive and progesterone receptor, 80% positive, both with moderate-strong staining intensity. Proliferation marker Ki67 at 30%. HER2 negative by immunohistochemistry (0).  The biopsied lymph node was positive for metastatic carcinoma.  The patient's subsequent history is as detailed below.   PAST MEDICAL HISTORY: Past Medical History:  Diagnosis Date   Anxiety    Arthritis    OP   Breast cancer ([place])    Depression    Family history of breast cancer 02/13/2020   History of radiation therapy 04/17/20-06/03/20   IMRT to Left breast Dr. 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   Personal history of radiation therapy    Premature ovarian failure    Dx'd  age 32   Thyroid disease    hypothyroid    PAST SURGICAL HISTORY: Past Surgical History:  Procedure Laterality Date   BREAST BIOPSY Left 02/04/2020   breast and lymphnode   BREAST LUMPECTOMY Left 03/12/2020   BREAST LUMPECTOMY WITH RADIOACTIVE SEED AND SENTINEL LYMPH NODE BIOPSY Left  03/12/2020   Procedure: LEFT BREAST LUMPECTOMY WITH RADIOACTIVE SEED AND LEFT TARGETED RADIOACTIVE SEED LYMPH NODE BIOPSY AND LEFT SENTINEL LYMPH NODE MAPPING;  Surgeon: Erroll Luna, MD;  Location: [facility];  Service: General;  Laterality: Left;   DILATATION & CURETTAGE/HYSTEROSCOPY WITH MYOSURE N/A 10/07/2015   Procedure: DILATATION & CURETTAGE/HYSTEROSCOPY WITH MYOSURE;  Surgeon: Nunzio Cobbs, MD;  Location: [facility];  Service: Gynecology;  Laterality: N/A;  endometrial polyp   WISDOM TOOTH EXTRACTION      FAMILY HISTORY: Family History  Problem Relation Age of Onset   Depression Mother    Multiple sclerosis Father    Other Father 37       right leg amputated below knee   Thyroid disease Sister        hypothyroidism   Hypertension Maternal Grandmother    Heart disease Maternal Grandmother    Depression Maternal Grandmother    Diabetes Maternal Grandfather    Other Maternal Grandfather        benign brain tumor   Dementia Paternal Grandmother    Alcohol abuse Paternal Grandfather    Prostate cancer Cousin 22       maternal cousin   Colon cancer Other        MGF's brother; dx 62s   Her father is age 78 and her mother age 56, as of 01/2020. Triva has one brother and one sister. She reports breast cancer in her mother's paternal first cousin in her 85's. She also notes colon cancer in her mother's paternal uncle in his 52's.   GYNECOLOGIC HISTORY:  Patient's last menstrual period was 08/07/2015 (approximate). Menarche: 50 years old [place] P 0 LMP 2015 Contraceptive: used from age [age] HRT used for 11 years, stopped with cancer diagnosis  Hysterectomy? no BSO? 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 Husband Talmadge "Michaela Roman" Burt Ek is a Doctor, hospital, as well as the Chief of Staff [employer] here in [place]. She lives at home with Michaela Roman, no pets.  The patient is a  Buddhist.    ADVANCED DIRECTIVES: in place   HEALTH MAINTENANCE: Social History   Tobacco Use   Smoking status: Never   Smokeless tobacco: Never  Vaping Use   Vaping Use: Never used  Substance Use Topics   Alcohol use: Yes    Alcohol/week: 1.0 standard drink    Types: 1 Glasses of wine per week    Comment: one drink a week   Drug use: No     Colonoscopy: n/a (age)  PAP: 09/2019  Bone density: 11/2018, -1.5   Allergies  Allergen Reactions   Sertraline Hives    Current Outpatient Medications  Medication Sig Dispense Refill   calcium-vitamin D (OSCAL WITH D) 500-200 MG-UNIT tablet Take 1 tablet by mouth.     levothyroxine (SYNTHROID) 112 MCG tablet Take 1 tablet by mouth daily before breakfast. 90 tablet 0   Multiple Vitamins-Minerals (MULTIVITAMIN PO) Take 1 tablet by mouth daily.     tamoxifen (NOLVADEX) 20 MG tablet Take 1 tablet (20 mg total) by mouth daily. 30 tablet 6   Vilazodone HCl (  VIIBRYD) 20 MG TABS Take 1 tablet (20 mg total) by mouth daily. 30 tablet 1   No current facility-administered medications for this visit.    OBJECTIVE: White woman who appears well Vitals:   12/10/20 1608  BP: (!) 103/56  Pulse: (!) 53  Resp: 18  Temp: 98.1 F (36.7 C)  SpO2: 98%      Body mass index is 27.99 kg/m.   Wt Readings from Last 3 Encounters:  12/10/20 155 lb 8 oz (70.5 kg)  12/08/20 153 lb 6 oz (69.6 kg)  10/23/20 154 lb (69.9 kg)      ECOG FS:1  Sclerae unicteric, EOMs intact Oropharynx clear and moist No cervical or supraclavicular adenopathy Lungs no rales or rhonchi Heart regular rate and rhythm Abd soft, nontender, positive bowel sounds MSK no focal spinal tenderness, no upper extremity lymphedema Neuro: nonfocal, well oriented, appropriate affect Breasts: The right breast is benign per the left breast is status postlumpectomy and radiation.  There is no evidence of disease recurrence.  Both axillae are benign.    LAB RESULTS:  CMP      Component Value Date/Time   NA 141 03/07/2020 1050   NA 140 10/17/2018 0922   K 4.4 03/07/2020 1050   CL 102 03/07/2020 1050   CO2 30 03/07/2020 1050   GLUCOSE 99 03/07/2020 1050   BUN 15 03/07/2020 1050   BUN 12 10/17/2018 0922   CREATININE 0.91 03/07/2020 1050   CREATININE 1.03 (H) 02/13/2020 0821   CREATININE 0.95 08/22/2015 1653   CALCIUM 9.3 03/07/2020 1050   PROT 6.8 03/07/2020 1050   PROT 6.5 10/17/2018 0922   ALBUMIN 4.2 03/07/2020 1050   ALBUMIN 4.5 10/17/2018 0922   AST 26 03/07/2020 1050   AST 20 02/13/2020 0821   ALT 24 03/07/2020 1050   ALT 21 02/13/2020 0821   ALKPHOS 41 03/07/2020 1050   BILITOT 0.6 03/07/2020 1050   BILITOT 0.8 02/13/2020 0821   GFRNONAA >60 03/07/2020 1050   GFRNONAA >60 02/13/2020 0821   GFRAA 81 10/17/2018 0922    No results found for: TOTALPROTELP, ALBUMINELP, A1GS, A2GS, BETS, BETA2SER, GAMS, MSPIKE, SPEI  Lab Results  Component Value Date   WBC 5.3 03/07/2020   NEUTROABS 3.2 03/07/2020   HGB 14.2 03/07/2020   HCT 43.3 03/07/2020   MCV 93.7 03/07/2020   PLT 183 03/07/2020    No results found for: LABCA2  No components found for: LZJQBH419  No results for input(s): INR in the last 168 hours.  No results found for: LABCA2  No results found for: FXT024  No results found for: OXB353  No results found for: GDJ242  No results found for: CA2729  No components found for: HGQUANT  No results found for: CEA1 / No results found for: CEA1   No results found for: AFPTUMOR  No results found for: CHROMOGRNA  No results found for: KPAFRELGTCHN, LAMBDASER, KAPLAMBRATIO (kappa/lambda light chains)  No results found for: HGBA, HGBA2QUANT, HGBFQUANT, HGBSQUAN (Hemoglobinopathy evaluation)   No results found for: LDH  No results found for: IRON, TIBC, IRONPCTSAT (Iron and TIBC)  No results found for: FERRITIN  Urinalysis    Component Value Date/Time   BILIRUBINUR n 08/22/2015 1436   PROTEINUR n 08/22/2015 1436    UROBILINOGEN negative 08/22/2015 1436   NITRITE n 08/22/2015 1436   LEUKOCYTESUR Negative 08/22/2015 1436    STUDIES: No results found.   ELIGIBLE FOR AVAILABLE RESEARCH PROTOCOL: AET  ASSESSMENT: 50 y.o. [place] woman status post left  breast and left axillary lymph node biopsy 02/04/2020 for a clinical T1c N1, stage IB invasive ductal carcinoma, grade 2, estrogen and progesterone receptor positive, HER-2 not amplified, with an MIB-1 of 30%   (1) status post left lumpectomy and targeted axillary lymph node dissection 03/12/2020 for a pT1c pN1a, stage IB invasive ductal carcinoma, grade 2, with negative margins  (a) a total of 4 left axillary lymph nodes removed, 1+  (2) MammaPrint to be obtained from the left breast biopsy sample showed a low risk, luminal a type breast cancer, predicting a 96% 5-year metastasis free survival with hormone therapy alone and no significant chemotherapy benefit  (3) genetics testing 02/26/2020 showed a 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   (b) The CancerNext-Expanded gene panel offered by [employer] found no additional mutations in AIP, ALK, APC, ATM, AXIN2, BAP1, BARD1, BLM, BMPR1A, BRCA1, BRCA2, BRIP1, CDC73, CDH1, CDK4, CDKN1B, CDKN2A, CHEK2, CTNNA1, DICER1, FANCC, FH, FLCN, GALNT12, KIF1B, LZTR1, MAX, MEN1, MET, MLH1, MSH2, MSH3, MSH6, MUTYH, NBN, NF1, NF2, NTHL1, PALB2, PHOX2B, PMS2, POT1, PRKAR1A, PTCH1, PTEN, RAD51C, RAD51D, RB1, RECQL, RET, SDHA, SDHAF2, SDHB, SDHC, SDHD, SMAD4, SMARCA4, SMARCB1, SMARCE1, STK11, SUFU, TMEM127, TP53, TSC1, TSC2, VHL and XRCC2 (sequencing and deletion/duplication); EGFR, EGLN1, HOXB13, KIT, MITF, PDGFRA, POLD1, and POLE (sequencing only); EPCAM and GREM1 (deletion/duplication only)  (4) adjuvant radiation 04/17/2020 through 06/03/2020 Site Technique Total Dose (Gy)  Dose per Fx (Gy) Completed Fx Beam Energies  Breast, Left: Breast_Lt 3D 50.4/50.4 1.8 28/28 6X, 10X  Breast, Left: Breast_Lt_SCLV 3D 50.4/50.4 1.8 28/28 6X, 15X  Breast, Left: Breast_Lt_Bst 3D 12/12 2 6/6 6X   (5) tamoxifen started 06/29/2020  (a) most recent bone scan shows osteopenia   PLAN: Rilyn is coming up in a year from definitive surgery for her breast cancer.  There is no evidence of disease recurrence.  This is favorable.  She is tolerating tamoxifen generally well.  I am not sure the insomnia is related to tamoxifen although certainly it could be.  The vast majority of my patients who are postmenopausal have problems with sleep.  We discussed all this at length and I think Tehillah is doing exactly the right thing, always getting out of bed in the morning at the same time and keeping the room at night cold.  She is using gabapentin with some success in falling asleep and she may add Benadryl if she wishes--for some people that can be very helpful.  She wanted to know if she was "cured".  It is hard to know what cured means in breast cancer since the very late recurrences have been documented.  What she certainly is is in remission and she has been in remission since she had her surgery.  This is commonly called "being cancer free" and I think people who say that actually me what we mean when we say the patient is in remission so she can say she is cancer free and that loose since.  No one can tell whether he or she has any cancer cells in the body because we have no test to tell us that.  The plan then is to continue tamoxifen for a minimum of 5 years.  She will have her next mammogram in July.  She will return to see [place] in August of next year.  She knows to call for any other issue that  may develop before then  Total encounter time 30 minutes.Wilber Bihari, NP 12/10/20 4:59 PM Medical Oncology and Hematology [facility] [address] [place], [place]  [address] Tel. [phone]    Fax. [phone]    *Total Encounter Time as defined by the Centers for Medicare and Medicaid Services includes, in addition to the face-to-face time of a patient visit (documented in the note above) non-face-to-face time: obtaining and reviewing outside history, ordering and reviewing medications, tests or procedures, care coordination (communications with other health care professionals or caregivers) and documentation in the medical record.

## 2020-12-29 ENCOUNTER — Ambulatory Visit: Payer: 59 | Admitting: Addiction (Substance Use Disorder)

## 2020-12-29 ENCOUNTER — Other Ambulatory Visit: Payer: Self-pay | Admitting: Behavioral Health

## 2020-12-29 DIAGNOSIS — F411 Generalized anxiety disorder: Secondary | ICD-10-CM

## 2020-12-29 DIAGNOSIS — F331 Major depressive disorder, recurrent, moderate: Secondary | ICD-10-CM

## 2020-12-29 DIAGNOSIS — F330 Major depressive disorder, recurrent, mild: Secondary | ICD-10-CM

## 2020-12-29 DIAGNOSIS — F4323 Adjustment disorder with mixed anxiety and depressed mood: Secondary | ICD-10-CM

## 2020-12-29 DIAGNOSIS — F4311 Post-traumatic stress disorder, acute: Secondary | ICD-10-CM

## 2020-12-30 ENCOUNTER — Other Ambulatory Visit: Payer: Self-pay

## 2021-01-06 ENCOUNTER — Other Ambulatory Visit: Payer: Self-pay

## 2021-01-06 ENCOUNTER — Ambulatory Visit (INDEPENDENT_AMBULATORY_CARE_PROVIDER_SITE_OTHER): Payer: 59 | Admitting: Addiction (Substance Use Disorder)

## 2021-01-06 DIAGNOSIS — F330 Major depressive disorder, recurrent, mild: Secondary | ICD-10-CM

## 2021-01-06 DIAGNOSIS — F4311 Post-traumatic stress disorder, acute: Secondary | ICD-10-CM | POA: Diagnosis not present

## 2021-01-06 NOTE — Progress Notes (Signed)
[facility] Counselor/Therapist Progress Note  Patient ID: Michaela Roman, MRN: [id number],    Date: 01/06/2021  Time Spent: 40mins  Treatment Type: Individual Therapy  Reported Symptoms: irritable, ambivalent about taking an SSRI   Mental Status Exam:  Appearance:   Casual     Behavior:  Appropriate and Sharing  Motor:  Normal  Speech/Language:   Clear and Coherent and Normal Rate  Affect:  Appropriate and Congruent  Mood:  angry, irritable, labile, and sad   Thought process:  normal  Thought content:    Obsessions and Rumination  Sensory/Perceptual disturbances:    WNL  Orientation:  x4  Attention:  Good  Concentration:  Good  Memory:  WNL  Fund of knowledge:   Good  Insight:    Fair  Judgment:   Good  Impulse Control:  Good   Risk Assessment: Danger to Self:  No. Denied.  Self-injurious Behavior: No Danger to Others: No Duty to Warn:no Physical Aggression / Violence:No  Access to Firearms a concern: No  Gang Involvement:No   Subjective: Client reported feeling very distrustful of psychiatry due to her issues with tapering from her medications. Client feeling ambivalent about staying on an SSRI right now due to its inability to support her much and her fear of it causing her to unwind. Therapist used CBT with client to help her process her thoughts and mindfulness to help client emotionally ground as she processed her fears. Client reported the complete emotional disaster she felt she was when she went off the SSRI & Benzo. She stated: "I felt like I was going to lose my mind & have some kind of break from not sleeping." Client reporting her resilience growing but also her awareness of medications and more confidence in advocating for herself. Therapist used MI to affirm her strengths and validating her progress. Client also feeling frustrated that cancer is something that people think helps them once they are through it. Client feels that cancer has opened her  eyes to how fragile this life is and has made her care about more meaningful issues. Client has courage to not run away from her feelings and process how cancer has changed her thought life; Therapist used MI to affirm client's strength and courage. Therapist assessed for stability, & client denied SI/HI/AVH.  Interventions: Cognitive Behavioral Therapy, Mindfulness Meditation, Motivational Interviewing, and RPT     Diagnosis:   ICD-10-CM   1. Acute posttraumatic stress disorder  F43.11     2. Mild episode of recurrent major depressive disorder ([place])  F33.0       Plan of Care: Client to return for weekly therapy with Sammuel Cooper, therapist, to review again in 6 months.  Client is to continue seeing medication provider for support of mood management.   Client to engage in CBT: challenging negative internal ruminations and self-talk AEB journaling daily or expressing thoughts to support persons in their life and then challenging it with truth.  Client to engage in tapping to tap in healthy cognition that challenges negative rumination or deep negative core belief.  Client to practice DBT distress tolerance skills to decrease crying spells and thoughts of not being able to endure their suffering AEB using mindfulness, deep breathing, and TIP to increase tolerance for discomfort, discharge emotional distress, and increase their understanding that they can do hard things.  Client to utilize BSP (brainspotting) with therapist to help client identify and process triggers for their depressive episode and anxiety with goal of  reducing said SUDs caused by depression/anxiety by 33% in the next 6 months.  Client to prioritize sleep 8+ hours each week night AEB going to bed by 10pm each night.   Barnie Del, LCSW, LCAS, CCTP, CCS-I, BSP

## 2021-01-13 ENCOUNTER — Ambulatory Visit (INDEPENDENT_AMBULATORY_CARE_PROVIDER_SITE_OTHER): Payer: 59 | Admitting: Addiction (Substance Use Disorder)

## 2021-01-13 ENCOUNTER — Ambulatory Visit (INDEPENDENT_AMBULATORY_CARE_PROVIDER_SITE_OTHER): Payer: 59 | Admitting: Behavioral Health

## 2021-01-13 ENCOUNTER — Encounter: Payer: Self-pay | Admitting: Behavioral Health

## 2021-01-13 ENCOUNTER — Other Ambulatory Visit: Payer: Self-pay

## 2021-01-13 DIAGNOSIS — F330 Major depressive disorder, recurrent, mild: Secondary | ICD-10-CM | POA: Diagnosis not present

## 2021-01-13 DIAGNOSIS — F99 Mental disorder, not otherwise specified: Secondary | ICD-10-CM

## 2021-01-13 DIAGNOSIS — F411 Generalized anxiety disorder: Secondary | ICD-10-CM | POA: Diagnosis not present

## 2021-01-13 DIAGNOSIS — F4311 Post-traumatic stress disorder, acute: Secondary | ICD-10-CM | POA: Diagnosis not present

## 2021-01-13 DIAGNOSIS — F4381 Prolonged grief disorder: Secondary | ICD-10-CM

## 2021-01-13 DIAGNOSIS — F5105 Insomnia due to other mental disorder: Secondary | ICD-10-CM

## 2021-01-13 NOTE — Progress Notes (Signed)
      [facility] Counselor/Therapist Progress Note  Patient ID: KAYLONI ROCCO, MRN: [id number],    Date: 01/13/2021  Time Spent: 22  Treatment Type: Individual Therapy  Reported Symptoms: sad, grieving   Mental Status Exam:  Appearance:   Casual     Behavior:  Appropriate and Sharing  Motor:  Normal  Speech/Language:   Clear and Coherent and Normal Rate  Affect:  Appropriate and Congruent  Mood:  angry, irritable, labile, and sad   Thought process:  normal  Thought content:    Obsessions and Rumination  Sensory/Perceptual disturbances:    WNL  Orientation:  x4  Attention:  Good  Concentration:  Good  Memory:  WNL  Fund of knowledge:   Good  Insight:    Fair  Judgment:   Good  Impulse Control:  Good   Risk Assessment: Danger to Self:  No. Denied.  Self-injurious Behavior: No Danger to Others: No Duty to Warn:no Physical Aggression / Violence:No  Access to Firearms a concern: No  Gang Involvement:No   Subjective: Client reported feeling increased weariness around this holiday season, due to it being the anniversary of when she found out she had cancer this time last year. Client processed more of the trauma it caused her and her sense of safety. Client faced with her own mortality and client processed the fears around that. Therapist used MI & grief therapy with client to support her in processing her thoughts and feelings from the change of her life due to the cancer. Client processed her fear of spiraling if she went off her SSRI, and her decision to stay on it, despite her feelings against it. Therapist used MI to affirm client's strength and courage. Therapist assessed for stability, & client denied SI/HI/AVH.  Interventions: Cognitive Behavioral Therapy, Motivational Interviewing, Grief Therapy, and RPT     Diagnosis:   ICD-10-CM   1. Acute posttraumatic stress disorder  F43.11       Plan of Care: Client to return for weekly therapy with Sammuel Cooper,  therapist, to review again in 6 months.  Client is to continue seeing medication provider for support of mood management.   Client to engage in CBT: challenging negative internal ruminations and self-talk AEB journaling daily or expressing thoughts to support persons in their life and then challenging it with truth.  Client to engage in tapping to tap in healthy cognition that challenges negative rumination or deep negative core belief.  Client to practice DBT distress tolerance skills to decrease crying spells and thoughts of not being able to endure their suffering AEB using mindfulness, deep breathing, and TIP to increase tolerance for discomfort, discharge emotional distress, and increase their understanding that they can do hard things.  Client to utilize BSP (brainspotting) with therapist to help client identify and process triggers for their depressive episode and anxiety with goal of reducing said SUDs caused by depression/anxiety by 33% in the next 6 months.  Client to prioritize sleep 8+ hours each week night AEB going to bed by 10pm each night.   Barnie Del, LCSW, LCAS, CCTP, CCS-I, BSP

## 2021-01-13 NOTE — Progress Notes (Signed)
[facility] Check  Patient ID: Michaela Roman,  MRN: [id number]  PCP: Glenis Smoker, MD  Date of Evaluation: 01/13/2021 Time spent:30 minutes  Chief Complaint:  Chief Complaint   Anxiety; Depression; Follow-up     HISTORY/CURRENT STATUS: HPI   50 year old female presents to this office for follow up and medication management. Says she has now been taking Viibryd for 2 months. She says the Viibryd is working well for lessening symptoms of anxiety ad depression. However she does believe Viibryd may be starting to cause some mild sexual side effect with problems reaching climax. She is questioning if she needs to remain on the medication now that she feels better. She does agree to continue with the medication for another 6 month before revisiting discussion. She says she understands that she is on Tamoxifen for estrogen reduction and that some antidepressants can interfere. Research has shown that [place] is also safer choice. She is reporting anxiety today at 2/10 and depression at 1/10. She says she is not taking the Lunesta right now but has found that practicing good sleep hygiene has improved her sleep. She is getting about 7-8 hours per night.  No mania, no psychosis. No SI/HI.      Wellbutrin- Insomnia, racing thoughts Stopped after 2 weeks.  Effexor-pt said was not controlling depression Zoloft- Took for a few weeks. Had insomnia. Developed hives.  Lexapro Lorazepam- Had a calming effect. Not effective with decreasing middle of the night awakenings. Melatonin- Vivid dreams Trazodone- Felt sedated but did not sleep.  Lunesta 1mg - said was not effective        Individual Medical History/ Review of Systems: Changes? :No   Allergies: Sertraline  Current Medications:  Current Outpatient Medications:    calcium-vitamin D (OSCAL WITH D) 500-200 MG-UNIT tablet, Take 1 tablet by mouth., Disp: , Rfl:    levothyroxine (SYNTHROID) 112 MCG tablet, Take 1  tablet by mouth daily before breakfast., Disp: 90 tablet, Rfl: 0   Multiple Vitamins-Minerals (MULTIVITAMIN PO), Take 1 tablet by mouth daily., Disp: , Rfl:    tamoxifen (NOLVADEX) 20 MG tablet, Take 1 tablet (20 mg total) by mouth daily., Disp: 30 tablet, Rfl: 6   Vilazodone HCl 20 MG TABS, TAKE 1 TABLET(20 MG) BY MOUTH DAILY, Disp: 30 tablet, Rfl: 0 Medication Side Effects: none  Family Medical/ Social History: Changes? No  MENTAL HEALTH EXAM:  Last menstrual period 08/07/2015.There is no height or weight on file to calculate BMI.  General Appearance: Casual and Neat  Eye Contact:  Good  Speech:  Clear and Coherent  Volume:  Normal  Mood:  NA  Affect:  Appropriate  Thought Process:  Coherent  Orientation:  Full (Time, Place, and Person)  Thought Content: Logical   Suicidal Thoughts:  No  Homicidal Thoughts:  No  Memory:  WNL  Judgement:  Good  Insight:  Good  Psychomotor Activity:  Normal  Concentration:  Concentration: Good  Recall:  Good  Fund of Knowledge: Good  Language: Good  Assets:  Desire for Improvement  ADL's:  Intact  Cognition: WNL  Prognosis:  Good    DIAGNOSES:    ICD-10-CM   1. Acute posttraumatic stress disorder  F43.11     2. Mild episode of recurrent major depressive disorder (HCC)  F33.0     3. GAD (generalized anxiety disorder)  F41.1     4. Insomnia due to other mental disorder  F51.05    F99       Receiving  Psychotherapy: Yes Sammuel Cooper   RECOMMENDATIONS:     Continue Viibryd 20 mg daily   If needed may increase  Lunesta  to  2 mg at bedtime.   To continue Gabapentin 300 mg 1-2 capsules daily   Will report any worsening symptoms promptly   To follow up in 3 months to reassess.   Provided emergency contact info   Greater than 50% of 30 face to face time with patient was spent on counseling and coordination of care.  We discussed her recent improvement with anxiety and depression. No concerns with thought that Viibryd was  causing excess gas this visit but she believes it may be causing her some Sexual side effects now and trouble reaching orgasm. She has been on Viibryd now for 2 months. She understand that SSE is more rare with Viibryd but can occur. Educated pt that we are very limited with medications that have lower chance of SSE. She is not using the Lunesta very often, says 1 mg did not work well and now she is just practicing better sleep hygiene and says her sleep has improved recently.  Reviewed PDMP       Elwanda Brooklyn, NP

## 2021-01-20 ENCOUNTER — Ambulatory Visit (INDEPENDENT_AMBULATORY_CARE_PROVIDER_SITE_OTHER): Payer: 59 | Admitting: Addiction (Substance Use Disorder)

## 2021-01-20 ENCOUNTER — Other Ambulatory Visit: Payer: Self-pay

## 2021-01-20 DIAGNOSIS — F4311 Post-traumatic stress disorder, acute: Secondary | ICD-10-CM

## 2021-01-20 NOTE — Progress Notes (Signed)
      [facility] Counselor/Therapist Progress Note  Patient ID: Michaela Roman, MRN: [id number],    Date: 01/20/2021  Time Spent: 33mins  Treatment Type: Individual Therapy  Reported Symptoms: despairing, over thinking, insomnia.   Mental Status Exam:  Appearance:   Casual     Behavior:  Appropriate and Sharing  Motor:  Normal  Speech/Language:   Clear and Coherent and Normal Rate  Affect:  Appropriate and Congruent  Mood:  irritable and labile   Thought process:  normal  Thought content:    Obsessions and Rumination  Sensory/Perceptual disturbances:    WNL  Orientation:  x4  Attention:  Good  Concentration:  Good  Memory:  WNL  Fund of knowledge:   Good  Insight:    Fair  Judgment:   Good  Impulse Control:  Good   Risk Assessment: Danger to Self:  No. Denied.  Self-injurious Behavior: No Danger to Others: No Duty to Warn:no Physical Aggression / Violence:No  Access to Firearms a concern: No  Gang Involvement:No   Subjective: Client reported not sleeping and looking at starting sleep rationing. Client took data on her average sleep daily is 7.25 hours and needing to make it longer so she forces herself to stay awake. Client needing to wind down with a book or less engaging activity: ie tv. Client despairing that it will get better, and therapist used CBT and mindfulness to work with client to think positive thoughts.about her ability to heal. Therapist used MI to affirm client's intentions and help encourage ongoing perseverance.  Client processed more of her mind blocks in the way of her writing. Therapist assessed for stability, & client denied SI/HI/AVH.  Interventions: Cognitive Behavioral Therapy, Mindfulness Meditation, Motivational Interviewing, and RPT     Diagnosis:   ICD-10-CM   1. Acute posttraumatic stress disorder  F43.11        Plan of Care: Client to return for weekly therapy with Sammuel Cooper, therapist, to review again in 6 months.  Client  is to continue seeing medication provider for support of mood management.   Client to engage in CBT: challenging negative internal ruminations and self-talk AEB journaling daily or expressing thoughts to support persons in their life and then challenging it with truth.  Client to engage in tapping to tap in healthy cognition that challenges negative rumination or deep negative core belief.  Client to practice DBT distress tolerance skills to decrease crying spells and thoughts of not being able to endure their suffering AEB using mindfulness, deep breathing, and TIP to increase tolerance for discomfort, discharge emotional distress, and increase their understanding that they can do hard things.  Client to utilize BSP (brainspotting) with therapist to help client identify and process triggers for their depressive episode and anxiety with goal of reducing said SUDs caused by depression/anxiety by 33% in the next 6 months.  Client to prioritize sleep 8+ hours each week night AEB going to bed by 10pm each night.   Barnie Del, LCSW, LCAS, CCTP, CCS-I, BSP

## 2021-01-27 ENCOUNTER — Ambulatory Visit (INDEPENDENT_AMBULATORY_CARE_PROVIDER_SITE_OTHER): Payer: 59 | Admitting: Addiction (Substance Use Disorder)

## 2021-01-27 ENCOUNTER — Other Ambulatory Visit: Payer: Self-pay

## 2021-01-27 DIAGNOSIS — F4311 Post-traumatic stress disorder, acute: Secondary | ICD-10-CM

## 2021-01-27 NOTE — Progress Notes (Signed)
      [facility] Counselor/Therapist Progress Note  Patient ID: ROSETTE BELLAVANCE, MRN: [id number],    Date: 01/27/2021  Time Spent: 55 mins  Treatment Type: Individual Therapy  Reported Symptoms: feeling less than  Mental Status Exam:  Appearance:   Casual     Behavior:  Appropriate and Sharing  Motor:  Normal  Speech/Language:   Clear and Coherent and Normal Rate  Affect:  Appropriate and Congruent  Mood:  anxious, irritable, and sad   Thought process:  normal  Thought content:    Obsessions and Rumination  Sensory/Perceptual disturbances:    WNL  Orientation:  x4  Attention:  Good  Concentration:  Good  Memory:  WNL  Fund of knowledge:   Good  Insight:    Fair  Judgment:   Good  Impulse Control:  Good   Risk Assessment: Danger to Self:  No. Denied.  Self-injurious Behavior: No Danger to Others: No Duty to Warn:no Physical Aggression / Violence:No  Access to Firearms a concern: No  Gang Involvement:No   Subjective: Client reported feeling less than when she cant do something her husband can. Client expressed her insecurities around that and feeling like "the side piece". Client processed her struggle & therapist used MI & CBT with client to help her further process her distorted thoughts and support her. Client expressed her fight in beating back self defeating thoughts about her worth wound up into her writing abilities. Therapist assessed for stability, & client denied SI/HI/AVH.  Interventions: Cognitive Behavioral Therapy, Motivational Interviewing, and RPT     Diagnosis: No diagnosis found.  Plan of Care: Client to return for weekly therapy with Sammuel Cooper, therapist, to review again in 6 months.  Client is to continue seeing medication provider for support of mood management.   Client to engage in CBT: challenging negative internal ruminations and self-talk AEB journaling daily or expressing thoughts to support persons in their life and then challenging  it with truth.  Client to engage in tapping to tap in healthy cognition that challenges negative rumination or deep negative core belief.  Client to practice DBT distress tolerance skills to decrease crying spells and thoughts of not being able to endure their suffering AEB using mindfulness, deep breathing, and TIP to increase tolerance for discomfort, discharge emotional distress, and increase their understanding that they can do hard things.  Client to utilize BSP (brainspotting) with therapist to help client identify and process triggers for their depressive episode and anxiety with goal of reducing said SUDs caused by depression/anxiety by 33% in the next 6 months.  Client to prioritize sleep 8+ hours each week night AEB going to bed by 10pm each night.   Barnie Del, LCSW, LCAS, CCTP, CCS-I, BSP

## 2021-01-29 ENCOUNTER — Encounter (HOSPITAL_BASED_OUTPATIENT_CLINIC_OR_DEPARTMENT_OTHER): Payer: Self-pay | Admitting: Surgery

## 2021-01-29 ENCOUNTER — Other Ambulatory Visit: Payer: Self-pay

## 2021-02-02 ENCOUNTER — Other Ambulatory Visit: Payer: Self-pay | Admitting: Behavioral Health

## 2021-02-02 DIAGNOSIS — F411 Generalized anxiety disorder: Secondary | ICD-10-CM

## 2021-02-02 DIAGNOSIS — F331 Major depressive disorder, recurrent, moderate: Secondary | ICD-10-CM

## 2021-02-02 DIAGNOSIS — F4323 Adjustment disorder with mixed anxiety and depressed mood: Secondary | ICD-10-CM

## 2021-02-02 DIAGNOSIS — F4311 Post-traumatic stress disorder, acute: Secondary | ICD-10-CM

## 2021-02-02 DIAGNOSIS — F330 Major depressive disorder, recurrent, mild: Secondary | ICD-10-CM

## 2021-02-03 ENCOUNTER — Ambulatory Visit: Payer: Self-pay | Admitting: Surgery

## 2021-02-04 NOTE — Progress Notes (Signed)
Text sent to inform patient about new pre-surgical soap orders and ERAS protocol.

## 2021-02-05 ENCOUNTER — Other Ambulatory Visit: Payer: Self-pay

## 2021-02-05 ENCOUNTER — Encounter (HOSPITAL_BASED_OUTPATIENT_CLINIC_OR_DEPARTMENT_OTHER): Payer: Self-pay | Admitting: Surgery

## 2021-02-05 ENCOUNTER — Ambulatory Visit (HOSPITAL_BASED_OUTPATIENT_CLINIC_OR_DEPARTMENT_OTHER): Payer: 59 | Admitting: Anesthesiology

## 2021-02-05 ENCOUNTER — Ambulatory Visit (HOSPITAL_BASED_OUTPATIENT_CLINIC_OR_DEPARTMENT_OTHER)
Admission: RE | Admit: 2021-02-05 | Discharge: 2021-02-05 | Disposition: A | Discharge status: Home / Self Care | Payer: 59 | Attending: Surgery | Admitting: Surgery

## 2021-02-05 ENCOUNTER — Encounter (HOSPITAL_BASED_OUTPATIENT_CLINIC_OR_DEPARTMENT_OTHER)
Admission: RE | Disposition: A | Discharge status: Home / Self Care | Payer: Self-pay | Source: Home / Self Care | Attending: Surgery

## 2021-02-05 DIAGNOSIS — F419 Anxiety disorder, unspecified: Secondary | ICD-10-CM | POA: Diagnosis not present

## 2021-02-05 DIAGNOSIS — Z170 Estrogen receptor positive status [ER+]: Secondary | ICD-10-CM | POA: Diagnosis not present

## 2021-02-05 DIAGNOSIS — L905 Scar conditions and fibrosis of skin: Secondary | ICD-10-CM | POA: Diagnosis present

## 2021-02-05 DIAGNOSIS — G709 Myoneural disorder, unspecified: Secondary | ICD-10-CM | POA: Diagnosis not present

## 2021-02-05 DIAGNOSIS — E039 Hypothyroidism, unspecified: Secondary | ICD-10-CM | POA: Insufficient documentation

## 2021-02-05 DIAGNOSIS — Z9889 Other specified postprocedural states: Secondary | ICD-10-CM | POA: Diagnosis not present

## 2021-02-05 DIAGNOSIS — C50912 Malignant neoplasm of unspecified site of left female breast: Secondary | ICD-10-CM | POA: Insufficient documentation

## 2021-02-05 DIAGNOSIS — F32A Depression, unspecified: Secondary | ICD-10-CM | POA: Insufficient documentation

## 2021-02-05 DIAGNOSIS — M1990 Unspecified osteoarthritis, unspecified site: Secondary | ICD-10-CM | POA: Insufficient documentation

## 2021-02-05 HISTORY — PX: SCAR REVISION: SHX5285

## 2021-02-05 SURGERY — REVISION, SCAR
Anesthesia: General | Site: Breast | Laterality: Left

## 2021-02-05 MED ORDER — PROPOFOL 10 MG/ML IV BOLUS
INTRAVENOUS | Status: AC
  Filled 2021-02-05: qty 20

## 2021-02-05 MED ORDER — MIDAZOLAM HCL 2 MG/2ML IJ SOLN
INTRAMUSCULAR | Status: AC
  Filled 2021-02-05: qty 2

## 2021-02-05 MED ORDER — ACETAMINOPHEN 160 MG/5ML PO SOLN: 325.0000 mg | ORAL | Status: DC | PRN

## 2021-02-05 MED ORDER — GLYCOPYRROLATE 0.2 MG/ML IJ SOLN
INTRAMUSCULAR | Status: DC | PRN
  Administered 2021-02-05: .2 mg via INTRAVENOUS

## 2021-02-05 MED ORDER — FENTANYL CITRATE (PF) 100 MCG/2ML IJ SOLN
INTRAMUSCULAR | Status: AC
  Filled 2021-02-05: qty 2

## 2021-02-05 MED ORDER — OXYCODONE HCL 5 MG PO TABS: 5.0000 mg | ORAL_TABLET | Freq: Once | ORAL | Status: DC | PRN

## 2021-02-05 MED ORDER — BUPIVACAINE-EPINEPHRINE 0.25% -1:200000 IJ SOLN
INTRAMUSCULAR | Status: DC | PRN
  Administered 2021-02-05: 10 mL

## 2021-02-05 MED ORDER — IBUPROFEN 800 MG PO TABS: 800.0000 mg | ORAL_TABLET | Freq: Three times a day (TID) | ORAL | 0 refills | Status: DC | PRN

## 2021-02-05 MED ORDER — DEXAMETHASONE SODIUM PHOSPHATE 10 MG/ML IJ SOLN
INTRAMUSCULAR | Status: DC | PRN
  Administered 2021-02-05: 5 mg via INTRAVENOUS

## 2021-02-05 MED ORDER — GLYCOPYRROLATE PF 0.2 MG/ML IJ SOSY
PREFILLED_SYRINGE | INTRAMUSCULAR | Status: AC
  Filled 2021-02-05: qty 1

## 2021-02-05 MED ORDER — LIDOCAINE HCL (CARDIAC) PF 100 MG/5ML IV SOSY
PREFILLED_SYRINGE | INTRAVENOUS | Status: DC | PRN
  Administered 2021-02-05: 40 mg via INTRATRACHEAL

## 2021-02-05 MED ORDER — MIDAZOLAM HCL 5 MG/5ML IJ SOLN
INTRAMUSCULAR | Status: DC | PRN
  Administered 2021-02-05: 2 mg via INTRAVENOUS

## 2021-02-05 MED ORDER — GABAPENTIN 300 MG PO CAPS: 300.0000 mg | ORAL_CAPSULE | ORAL | Status: DC

## 2021-02-05 MED ORDER — HYDROCODONE-ACETAMINOPHEN 5-325 MG PO TABS: 1.0000 | ORAL_TABLET | Freq: Four times a day (QID) | ORAL | 0 refills | Status: DC | PRN

## 2021-02-05 MED ORDER — CHLORHEXIDINE GLUCONATE CLOTH 2 % EX PADS: 6.0000 | MEDICATED_PAD | Freq: Once | CUTANEOUS | Status: DC

## 2021-02-05 MED ORDER — FENTANYL CITRATE (PF) 100 MCG/2ML IJ SOLN
INTRAMUSCULAR | Status: DC | PRN
  Administered 2021-02-05: 50 ug via INTRAVENOUS

## 2021-02-05 MED ORDER — LACTATED RINGERS IV SOLN: INTRAVENOUS | Status: DC

## 2021-02-05 MED ORDER — ACETAMINOPHEN 325 MG PO TABS: 325.0000 mg | ORAL_TABLET | ORAL | Status: DC | PRN

## 2021-02-05 MED ORDER — ONDANSETRON HCL 4 MG/2ML IJ SOLN
INTRAMUSCULAR | Status: AC
  Filled 2021-02-05: qty 2

## 2021-02-05 MED ORDER — ONDANSETRON HCL 4 MG/2ML IJ SOLN: 4.0000 mg | Freq: Once | INTRAMUSCULAR | Status: DC | PRN

## 2021-02-05 MED ORDER — PROPOFOL 10 MG/ML IV BOLUS
INTRAVENOUS | Status: DC | PRN
  Administered 2021-02-05: 140 mg via INTRAVENOUS

## 2021-02-05 MED ORDER — MEPERIDINE HCL 25 MG/ML IJ SOLN: 6.2500 mg | INTRAMUSCULAR | Status: DC | PRN

## 2021-02-05 MED ORDER — 0.9 % SODIUM CHLORIDE (POUR BTL) OPTIME
TOPICAL | Status: DC | PRN
  Administered 2021-02-05: 100 mL

## 2021-02-05 MED ORDER — CEFAZOLIN SODIUM-DEXTROSE 2-4 GM/100ML-% IV SOLN
2.0000 g | INTRAVENOUS | Status: AC
  Administered 2021-02-05: 2 g via INTRAVENOUS

## 2021-02-05 MED ORDER — FENTANYL CITRATE (PF) 100 MCG/2ML IJ SOLN: 25.0000 ug | INTRAMUSCULAR | Status: DC | PRN

## 2021-02-05 MED ORDER — OXYCODONE HCL 5 MG/5ML PO SOLN: 5.0000 mg | Freq: Once | ORAL | Status: DC | PRN

## 2021-02-05 MED ORDER — CEFAZOLIN SODIUM-DEXTROSE 2-4 GM/100ML-% IV SOLN
INTRAVENOUS | Status: AC
  Filled 2021-02-05: qty 100

## 2021-02-05 MED ORDER — ONDANSETRON HCL 4 MG/2ML IJ SOLN
INTRAMUSCULAR | Status: DC | PRN
  Administered 2021-02-05: 4 mg via INTRAVENOUS

## 2021-02-05 SURGICAL SUPPLY — 46 items
ADH SKN CLS APL DERMABOND .7 (GAUZE/BANDAGES/DRESSINGS) ×1
APL PRP STRL LF DISP 70% ISPRP (MISCELLANEOUS) ×1
APPLIER CLIP 9.375 MED OPEN (MISCELLANEOUS)
APR CLP MED 9.3 20 MLT OPN (MISCELLANEOUS)
BINDER BREAST LRG (GAUZE/BANDAGES/DRESSINGS) IMPLANT
BINDER BREAST MEDIUM (GAUZE/BANDAGES/DRESSINGS) ×2 IMPLANT
BINDER BREAST XLRG (GAUZE/BANDAGES/DRESSINGS) IMPLANT
BINDER BREAST XXLRG (GAUZE/BANDAGES/DRESSINGS) IMPLANT
BLADE SURG 15 STRL LF DISP TIS (BLADE) ×1 IMPLANT
BLADE SURG 15 STRL SS (BLADE) ×2
CANISTER SUCT 1200ML W/VALVE (MISCELLANEOUS) ×2 IMPLANT
CHLORAPREP W/TINT 26 (MISCELLANEOUS) ×2 IMPLANT
CLIP APPLIE 9.375 MED OPEN (MISCELLANEOUS) IMPLANT
COVER BACK TABLE 60X90IN (DRAPES) ×2 IMPLANT
COVER MAYO STAND STRL (DRAPES) ×2 IMPLANT
DECANTER SPIKE VIAL GLASS SM (MISCELLANEOUS) ×2 IMPLANT
DERMABOND ADVANCED (GAUZE/BANDAGES/DRESSINGS) ×1
DERMABOND ADVANCED .7 DNX12 (GAUZE/BANDAGES/DRESSINGS) ×1 IMPLANT
DRAPE LAPAROSCOPIC ABDOMINAL (DRAPES) IMPLANT
DRAPE LAPAROTOMY 100X72 PEDS (DRAPES) ×2 IMPLANT
DRAPE UTILITY XL STRL (DRAPES) ×2 IMPLANT
ELECT COATED BLADE 2.86 ST (ELECTRODE) ×2 IMPLANT
ELECT REM PT RETURN 9FT ADLT (ELECTROSURGICAL) ×2
ELECTRODE REM PT RTRN 9FT ADLT (ELECTROSURGICAL) ×1 IMPLANT
GLOVE SRG 8 PF TXTR STRL LF DI (GLOVE) ×1 IMPLANT
GLOVE SURG LTX SZ8 (GLOVE) ×2 IMPLANT
GLOVE SURG UNDER POLY LF SZ8 (GLOVE) ×2
GOWN STRL REUS W/ TWL LRG LVL3 (GOWN DISPOSABLE) ×2 IMPLANT
GOWN STRL REUS W/ TWL XL LVL3 (GOWN DISPOSABLE) ×1 IMPLANT
GOWN STRL REUS W/TWL LRG LVL3 (GOWN DISPOSABLE) ×4
GOWN STRL REUS W/TWL XL LVL3 (GOWN DISPOSABLE) ×2
HEMOSTAT ARISTA ABSORB 3G PWDR (HEMOSTASIS) ×2 IMPLANT
NEEDLE HYPO 25X1 1.5 SAFETY (NEEDLE) ×2 IMPLANT
NS IRRIG 1000ML POUR BTL (IV SOLUTION) ×2 IMPLANT
PACK BASIN DAY SURGERY FS (CUSTOM PROCEDURE TRAY) ×2 IMPLANT
PENCIL SMOKE EVACUATOR (MISCELLANEOUS) ×2 IMPLANT
SLEEVE SCD COMPRESS KNEE MED (STOCKING) ×2 IMPLANT
SPONGE T-LAP 4X18 ~~LOC~~+RFID (SPONGE) ×2 IMPLANT
STAPLER VISISTAT 35W (STAPLE) IMPLANT
SUT MON AB 4-0 PC3 18 (SUTURE) ×2 IMPLANT
SUT SILK 2 0 SH (SUTURE) IMPLANT
SUT VICRYL 3-0 CR8 SH (SUTURE) ×2 IMPLANT
SYR CONTROL 10ML LL (SYRINGE) ×2 IMPLANT
TOWEL GREEN STERILE FF (TOWEL DISPOSABLE) ×4 IMPLANT
TUBE CONNECTING 20X1/4 (TUBING) ×2 IMPLANT
YANKAUER SUCT BULB TIP NO VENT (SUCTIONS) ×2 IMPLANT

## 2021-02-05 NOTE — Discharge Instructions (Addendum)
[facility] Office Phone Number [phone]  BREAST BIOPSY/ PARTIAL MASTECTOMY: POST OP INSTRUCTIONS  Always review your discharge instruction sheet given to you by the facility where your surgery was performed.  IF YOU HAVE DISABILITY OR FAMILY LEAVE FORMS, YOU MUST BRING THEM TO THE OFFICE FOR PROCESSING.  DO NOT GIVE THEM TO YOUR DOCTOR.  A prescription for pain medication may be given to you upon discharge.  Take your pain medication as prescribed, if needed.  If narcotic pain medicine is not needed, then you may take acetaminophen (Tylenol) or ibuprofen (Advil) as needed. Take your usually prescribed medications unless otherwise directed If you need a refill on your pain medication, please contact your pharmacy.  They will contact our office to request authorization.  Prescriptions will not be filled after 5pm or on week-ends. You should eat very light the first 24 hours after surgery, such as soup, crackers, pudding, etc.  Resume your normal diet the day after surgery. Most patients will experience some swelling and bruising in the breast.  Ice packs and a good support bra will help.  Swelling and bruising can take several days to resolve.  It is common to experience some constipation if taking pain medication after surgery.  Increasing fluid intake and taking a stool softener will usually help or prevent this problem from occurring.  A mild laxative (Milk of Magnesia or Miralax) should be taken according to package directions if there are no bowel movements after 48 hours. Unless discharge instructions indicate otherwise, you may remove your bandages 24-48 hours after surgery, and you may shower at that time.  You may have steri-strips (small skin tapes) in place directly over the incision.  These strips should be left on the skin for 7-10 days.  If your surgeon used skin glue on the incision, you may shower in 24 hours.  The glue will flake off over the next 2-3 weeks.  Any  sutures or staples will be removed at the office during your follow-up visit. ACTIVITIES:  You may resume regular daily activities (gradually increasing) beginning the next day.  Wearing a good support bra or sports bra minimizes pain and swelling.  You may have sexual intercourse when it is comfortable. You may drive when you no longer are taking prescription pain medication, you can comfortably wear a seatbelt, and you can safely maneuver your car and apply brakes. RETURN TO WORK:  ______________________________________________________________________________________ You should see your doctor in the office for a follow-up appointment approximately two weeks after your surgery.  Your doctor's nurse will typically make your follow-up appointment when she calls you with your pathology report.  Expect your pathology report 2-3 business days after your surgery.  You may call to check if you do not hear from us after three days. OTHER INSTRUCTIONS: _______________________________________________________________________________________________ _____________________________________________________________________________________________________________________________________ _____________________________________________________________________________________________________________________________________ _____________________________________________________________________________________________________________________________________  WHEN TO CALL YOUR DOCTOR: Fever over 101.0 Nausea and/or vomiting. Extreme swelling or bruising. Continued bleeding from incision. Increased pain, redness, or drainage from the incision.  The clinic staff is available to answer your questions during regular business hours.  Please don't hesitate to call and ask to speak to one of the nurses for clinical concerns.  If you have a medical emergency, go to the nearest emergency room or call 911.  A surgeon from [facility] is always on call at the hospital.  For further questions, please visit [email or web address]        Post Anesthesia Home Care Instructions  Activity: Get plenty of rest   for the remainder of the day. A responsible individual must stay with you for 24 hours following the procedure.  For the next 24 hours, DO NOT: -Drive a car -Operate machinery -Drink alcoholic beverages -Take any medication unless instructed by your physician -Make any legal decisions or sign important papers.  Meals: Start with liquid foods such as gelatin or soup. Progress to regular foods as tolerated. Avoid greasy, spicy, heavy foods. If nausea and/or vomiting occur, drink only clear liquids until the nausea and/or vomiting subsides. Call your physician if vomiting continues.  Special Instructions/Symptoms: Your throat may feel dry or sore from the anesthesia or the breathing tube placed in your throat during surgery. If this causes discomfort, gargle with warm salt water. The discomfort should disappear within 24 hours.      

## 2021-02-05 NOTE — Anesthesia Postprocedure Evaluation (Signed)
Anesthesia Post Note  Patient: Michaela Roman  Procedure(s) Performed: left breast SCAR REVISION (Left: Breast)     Patient location during evaluation: PACU Anesthesia Type: General Level of consciousness: awake and alert Pain management: pain level controlled Vital Signs Assessment: post-procedure vital signs reviewed and stable Respiratory status: spontaneous breathing, nonlabored ventilation, respiratory function stable and patient connected to nasal cannula oxygen Cardiovascular status: blood pressure returned to baseline and stable Postop Assessment: no apparent nausea or vomiting Anesthetic complications: no   No notable events documented.  Last Vitals:  Vitals:   02/05/21 0945 02/05/21 0948  BP: 102/69 110/77  Pulse: (!) 53 (!) 49  Resp: 16 18  Temp:  36.5 C  SpO2: 99% 99%    Last Pain:  Vitals:   02/05/21 0948  TempSrc:   PainSc: 0-No pain                 Solimar Maiden

## 2021-02-05 NOTE — H&P (Signed)
History of Present Illness: Michaela Roman is a 50 y.o. female who is seen today for follow-up of her left breast scar. She has a history of left breast lumpectomy and left axillary sentinel lymph node mapping for stage I left breast cancer. She was seen in follow-up and has some dimpling at the nipple at the scar site. She is interested in having scar revised due to cosmesis but also due to hygiene. Risks and benefits of this were discussed last visit and she is here today for follow-up. She has no new complaints..  Review of Systems: A complete review of systems was obtained from the patient. I have reviewed this information and discussed as appropriate with the patient. See HPI as well for other ROS.    Medical History: Past Medical History:  Diagnosis Date   History of cancer   There is no problem list on file for this patient.  Past Surgical History:  Procedure Laterality Date   MASTECTOMY PARTIAL / LUMPECTOMY   sentinel node injection   uterine polyp removal    Allergies  Allergen Reactions   Sertraline Hives   Current Outpatient Medications on File Prior to Visit  Medication Sig Dispense Refill   calcium carbonate-vitamin D3 (OS-CAL 500+D) 500 mg-5 mcg (200 unit) tablet Take 1 tablet by mouth   gabapentin (NEURONTIN) 300 MG capsule   levothyroxine (SYNTHROID) 112 MCG tablet levothyroxine 112 mcg tablet TAKE 1 TABLET BY MOUTH ONCE DAILY BEFORE BREAKFAST   tamoxifen (NOLVADEX) 20 MG tablet tamoxifen 20 mg tablet   vilazodone (VIIBRYD) 20 mg tablet   No current facility-administered medications on file prior to visit.   Family History  Problem Relation Age of Onset   Obesity Mother   High blood pressure (Hypertension) Mother    Social History   Tobacco Use  Smoking Status Never  Smokeless Tobacco Never    Social History   Socioeconomic History   Marital status: Married  Tobacco Use   Smoking status: Never   Smokeless tobacco: Never  Vaping Use    Vaping Use: Never used  Substance and Sexual Activity   Alcohol use: Yes   Drug use: Never   Objective:   There were no vitals filed for this visit.  There is no height or weight on file to calculate BMI.  Physical Exam Constitutional:  Appearance: Normal appearance.  HENT:  Head: Normocephalic.  Eyes:  Pupils: Pupils are equal, round, and reactive to light.  Pulmonary:  Effort: Pulmonary effort is normal.  Breath sounds: No stridor.  Chest:  Left nipple scaring noted  no mass  Musculoskeletal:  General: Normal range of motion.  Skin: General: Skin is warm and dry.  Neurological:  General: No focal deficit present.  Mental Status: She is alert and oriented to person, place, and time.  Psychiatric:  Mood and Affect: Mood normal.  Behavior: Behavior normal.     Assessment and Plan:  Diagnoses and all orders for this visit:  S/P lumpectomy of breast  Breast cancer, stage 1, estrogen receptor positive, left (CMS-HCC)    History of left breast lumpectomy for stage I left breast cancer. Now with scarring at the nipple. She has an interest in scar revision. I discussed options of scar revision as well as observation. The pros and cons of this as well as success rates of being around 70% or so were discussed today. Complications of bleeding, infection, trauma to the nipple or nipple loss were discussed as well. After discussion of all the above,  she would like to proceed with scar revision of the left breast scar nipple due to mild inversion of the nipple. Explained that this may recur after surgery as well and could require a different approach but this would be the most practical and best for step.  She is agreed to proceed. Risks of surgery include but not exclusive of bleeding, infection, nipple loss, recurrent scarring and dimpling, volume loss, swelling, pain, and the need further treatments and/or procedures. No follow-ups on file.

## 2021-02-05 NOTE — Anesthesia Procedure Notes (Signed)
Procedure Name: LMA Insertion Date/Time: 02/05/2021 8:34 AM Performed by: Glory Buff, CRNA Pre-anesthesia Checklist: Emergency Drugs available, Patient identified, Suction available and Patient being monitored Patient Re-evaluated:Patient Re-evaluated prior to induction Oxygen Delivery Method: Circle system utilized Preoxygenation: Pre-oxygenation with 100% oxygen Induction Type: IV induction LMA: LMA inserted LMA Size: 4.0 Number of attempts: 1 Placement Confirmation: positive ETCO2 Tube secured with: Tape Dental Injury: Teeth and Oropharynx as per pre-operative assessment

## 2021-02-05 NOTE — Op Note (Signed)
Preoperative diagnosis: Contracture scar left nipple status postlumpectomy  Postoperative diagnosis: Same  Procedure: Left breast scar revision  Surgeon: Erroll Luna, MD  Anesthesia: LMA with 0.25% Marcaine  EBL: Minimal  Specimen: None  Indications for procedure: The patient is a 50 year old female who is undergone previous left breast lumpectomy.  She developed contracture of the nipple.  This has been watched for a number of months but has not resolved.  Patient would like to have the scar released and revised and attempt to regain as much of her normal anatomy as she can.  We discussed this at great length.  We discussed procedures to do this and the risks involved in that.  We discussed the risk of releasing the nipple from scar tissue to include but not exclusive of bleeding, infection, nipple loss, loss of sensation of the nipple, the need for revisional surgery, and success rates of being somewhere around 70%.  We discussed doing nothing as well.  After lengthy discussion of all the above and the pros and cons of each she wished to proceed with scar revision/release with the hope that the nipple that is retracted somewhat would release.  After discussion the above she had no further questions and understood the procedure as well as risks and benefits of it.  Description of procedure: The patient was met in the holding area and questions were answered.  The procedure was reviewed.  She was then taken back to the operating.  She is placed supine upon the OR table.  After induction of general anesthesia, left breast was prepped and draped in a sterile fashion timeout performed.  Proper patient, site and procedure were verified.  Local anesthetic of 0.25% Marcaine with epinephrine was used.  This is only injected on the lateral aspect of the nipple areolar complex.  A small stab incision was made with #11 blade.  This was used to go up underneath the nipple to release the scar.  There was some  scarring in the skin no which may complete release impossible.  I was able to release the lateral half of the nipple areolar complex preserving the medial half of this.  There appeared to be no evidence of ischemia or any significant bleeding after releasing the nipple.  Arista was placed over the tract to aid in hemostasis.  Pressure was held.  After 5 minutes of observation there appeared to be no evidence of any ischemic change to the nipple.  Interrupted sutures of 4-0 Monocryl used to close the small skin incision noted.  Breast binder placed after application of Dermabond.  All counts were found to be correct.  The patient was awoke extubated taken to recovery in satisfactory condition.

## 2021-02-05 NOTE — Anesthesia Preprocedure Evaluation (Addendum)
Anesthesia Evaluation  Patient identified by MRN, date of birth, ID band Patient awake    Reviewed: Allergy & Precautions, NPO status , Patient's Chart, lab work & pertinent test results  Airway Mallampati: II  TM Distance: >3 FB Neck ROM: Full    Dental no notable dental hx. (+) Teeth Intact, Dental Advisory Given   Pulmonary neg pulmonary ROS,    Pulmonary exam normal breath sounds clear to auscultation       Cardiovascular negative cardio ROS Normal cardiovascular exam Rhythm:Regular Rate:Normal     Neuro/Psych PSYCHIATRIC DISORDERS Anxiety Depression  Neuromuscular disease    GI/Hepatic negative GI ROS, Neg liver ROS,   Endo/Other  Hypothyroidism   Renal/GU negative Renal ROS  negative genitourinary   Musculoskeletal  (+) Arthritis , Osteoarthritis,    Abdominal   Peds  Hematology negative hematology ROS (+)   Anesthesia Other Findings L breast ca   Reproductive/Obstetrics negative OB ROS                             Anesthesia Physical  Anesthesia Plan  ASA: 2  Anesthesia Plan: General   Post-op Pain Management:    Induction: Intravenous  PONV Risk Score and Plan: 4 or greater and Ondansetron, Midazolam and Dexamethasone  Airway Management Planned: LMA  Additional Equipment:   Intra-op Plan:   Post-operative Plan: Extubation in OR  Informed Consent: I have reviewed the patients History and Physical, chart, labs and discussed the procedure including the risks, benefits and alternatives for the proposed anesthesia with the patient or authorized representative who has indicated his/her understanding and acceptance.     Dental advisory given  Plan Discussed with: Anesthesiologist and CRNA  Anesthesia Plan Comments:         Anesthesia Quick Evaluation

## 2021-02-05 NOTE — Transfer of Care (Signed)
Immediate Anesthesia Transfer of Care Note  Patient: Michaela Roman  Procedure(s) Performed: left breast SCAR REVISION (Left: Breast)  Patient Location: PACU  Anesthesia Type:General  Level of Consciousness: drowsy, patient cooperative and responds to stimulation  Airway & Oxygen Therapy: Patient Spontanous Breathing and Patient connected to face mask oxygen  Post-op Assessment: Report given to RN and Post -op Vital signs reviewed and stable  Post vital signs: Reviewed and stable  Last Vitals:  Vitals Value Taken Time  BP    Temp    Pulse 57 02/05/21 0907  Resp 15 02/05/21 0907  SpO2 100 % 02/05/21 0907  Vitals shown include unvalidated device data.  Last Pain:  Vitals:   02/05/21 0816  TempSrc: Oral  PainSc: 0-No pain      Patients Stated Pain Goal: 3 (38/32/91 [id number])  Complications: No notable events documented.

## 2021-02-05 NOTE — Interval H&P Note (Signed)
History and Physical Interval Note:  02/05/2021 8:12 AM  Windle Guard  has presented today for surgery, with the diagnosis of left breast scar.  The various methods of treatment have been discussed with the patient and family. After consideration of risks, benefits and other options for treatment, the patient has consented to  Procedure(s) with comments: left breast SCAR REVISION (Left) - 60 as a surgical intervention.  The patient's history has been reviewed, patient examined, no change in status, stable for surgery.  I have reviewed the patient's chart and labs.  Questions were answered to the patient's satisfaction.     [place]

## 2021-02-06 ENCOUNTER — Encounter (HOSPITAL_BASED_OUTPATIENT_CLINIC_OR_DEPARTMENT_OTHER): Payer: Self-pay | Admitting: Surgery

## 2021-02-09 ENCOUNTER — Other Ambulatory Visit: Payer: Self-pay

## 2021-02-09 ENCOUNTER — Ambulatory Visit (INDEPENDENT_AMBULATORY_CARE_PROVIDER_SITE_OTHER): Payer: 59 | Admitting: Addiction (Substance Use Disorder)

## 2021-02-09 DIAGNOSIS — F330 Major depressive disorder, recurrent, mild: Secondary | ICD-10-CM | POA: Diagnosis not present

## 2021-02-09 DIAGNOSIS — F4311 Post-traumatic stress disorder, acute: Secondary | ICD-10-CM | POA: Diagnosis not present

## 2021-02-09 NOTE — Progress Notes (Signed)
      [facility] Counselor/Therapist Progress Note  Patient ID: MARYLAN GLORE, MRN: [id number],    Date: 02/09/2021  Time Spent: 46mins  Treatment Type: Individual Therapy  Reported Symptoms: angst, anger, erratic feelings  Mental Status Exam:  Appearance:   Casual     Behavior:  Appropriate and Sharing  Motor:  Normal  Speech/Language:   Clear and Coherent and Normal Rate  Affect:  Appropriate and Congruent  Mood:  anxious, irritable, and sad   Thought process:  normal  Thought content:    Obsessions and Rumination  Sensory/Perceptual disturbances:    WNL  Orientation:  x4  Attention:  Good  Concentration:  Good  Memory:  WNL  Fund of knowledge:   Good  Insight:    Fair  Judgment:   Fair  Impulse Control:  Poor   Risk Assessment: Danger to Self:  No. Denied.  Self-injurious Behavior: No Danger to Others: No Duty to Warn:no Physical Aggression / Violence:No  Access to Firearms a concern: No  Gang Involvement:No   Subjective: Client reported feeling like she just wants to leave/ run away and feeling angst built up inside. Client tearfully processed her anger, distress, anxiety and desire to isolate. Client processed not really knowing herself anymore and having a sort of "mid-life-crisis thought about her life". Client processed some of her dreams she ignored due to fear and her desire to go back and make some of those happen. Client processed her frustration with her husband and her care for her and therapist used MI & CBT with client to help support client as she processed her thoughts and feelings and roleplaying to help her articulate her goals but also her hurt feelings that she needs to share with her husband. Therapist inquired about client's continued med regimen and client confirmed she is still taking as prescribed. Therapist assessed for stability, & client denied SI/HI/AVH.  Interventions: Cognitive Behavioral Therapy, Roleplay, Motivational  Interviewing, and RPT     Diagnosis:   ICD-10-CM   1. Acute posttraumatic stress disorder  F43.11     2. Mild episode of recurrent major depressive disorder ([place])  F33.0       Plan of Care: Client to return for weekly therapy with Sammuel Cooper, therapist, to review again in 6 months.  Client is to continue seeing medication provider for support of mood management.   Client to engage in CBT: challenging negative internal ruminations and self-talk AEB journaling daily or expressing thoughts to support persons in their life and then challenging it with truth.  Client to engage in tapping to tap in healthy cognition that challenges negative rumination or deep negative core belief.  Client to practice DBT distress tolerance skills to decrease crying spells and thoughts of not being able to endure their suffering AEB using mindfulness, deep breathing, and TIP to increase tolerance for discomfort, discharge emotional distress, and increase their understanding that they can do hard things.  Client to utilize BSP (brainspotting) with therapist to help client identify and process triggers for their depressive episode and anxiety with goal of reducing said SUDs caused by depression/anxiety by 33% in the next 6 months.  Client to prioritize sleep 8+ hours each week night AEB going to bed by 10pm each night.   Barnie Del, LCSW, LCAS, CCTP, CCS-I, BSP

## 2021-03-05 ENCOUNTER — Telehealth: Payer: Self-pay | Admitting: Behavioral Health

## 2021-03-05 DIAGNOSIS — F4323 Adjustment disorder with mixed anxiety and depressed mood: Secondary | ICD-10-CM

## 2021-03-05 DIAGNOSIS — F411 Generalized anxiety disorder: Secondary | ICD-10-CM

## 2021-03-05 DIAGNOSIS — F330 Major depressive disorder, recurrent, mild: Secondary | ICD-10-CM

## 2021-03-05 DIAGNOSIS — F331 Major depressive disorder, recurrent, moderate: Secondary | ICD-10-CM

## 2021-03-05 DIAGNOSIS — F4311 Post-traumatic stress disorder, acute: Secondary | ICD-10-CM

## 2021-03-05 MED ORDER — VILAZODONE HCL 20 MG PO TABS: ORAL_TABLET | ORAL | 0 refills | Status: DC

## 2021-03-05 NOTE — Telephone Encounter (Signed)
Rx sent to the requested pharmacy. 

## 2021-03-05 NOTE — Telephone Encounter (Signed)
Patient called in refill for Vilazodone 20mg . States she has a new pharmacy and would like prescription sent there. Ph: [phone]. Appt 2/15. Pharmacy [facility]

## 2021-03-10 ENCOUNTER — Other Ambulatory Visit: Payer: Self-pay

## 2021-03-10 ENCOUNTER — Ambulatory Visit (INDEPENDENT_AMBULATORY_CARE_PROVIDER_SITE_OTHER): Payer: 59 | Admitting: Addiction (Substance Use Disorder)

## 2021-03-10 DIAGNOSIS — F411 Generalized anxiety disorder: Secondary | ICD-10-CM | POA: Diagnosis not present

## 2021-03-10 NOTE — Progress Notes (Signed)
°      [facility] Counselor/Therapist Progress Note  Patient ID: Michaela Roman, MRN: [id number],    Date: 03/10/2021  Time Spent: 58  Treatment Type: Individual Therapy  Reported Symptoms: humbled  Mental Status Exam:  Appearance:   Casual     Behavior:  Appropriate and Sharing  Motor:  Normal  Speech/Language:   Clear and Coherent and Normal Rate  Affect:  Appropriate and Congruent  Mood:  normal   Thought process:  normal  Thought content:    Obsessions and Rumination  Sensory/Perceptual disturbances:    WNL  Orientation:  x4  Attention:  Good  Concentration:  Good  Memory:  WNL  Fund of knowledge:   Good  Insight:    Good  Judgment:   Fair  Impulse Control:  Fair   Risk Assessment: Danger to Self:  No. Denied.  Self-injurious Behavior: No Danger to Others: No Duty to Warn:no Physical Aggression / Violence:No  Access to Firearms a concern: No  Gang Involvement:No   Subjective: Client reported feeling much less alone and more safe since sharing her complicated grief and frustration with healing since cancer with her husband. Client shared about her fear of sharing with him but how he responded in such a safe loving way, reminding her that he loved her and always wanted her with him and reminded her of her place/worth in this world. Client expressed that this was her negative core belief: not having worth or being "too much" emotionally for everyone in this world. Therapist used MI & CBT with client to validate her pain, affirm her progress and her worth, and support her as she processed her negative core belief and help her challenge those with truth. Therapist assessed for stability, & client denied SI/HI/AVH.  Interventions: Cognitive Behavioral Therapy, Motivational Interviewing, Grief Therapy, and RPT     Diagnosis: No diagnosis found.   Plan of Care: Client to return for weekly therapy with Sammuel Cooper, therapist, to review again in 6 months.  Client is  to continue seeing medication provider for support of mood management.   Client to engage in CBT: challenging negative internal ruminations and self-talk AEB journaling daily or expressing thoughts to support persons in their life and then challenging it with truth.  Client to engage in tapping to tap in healthy cognition that challenges negative rumination or deep negative core belief.  Client to practice DBT distress tolerance skills to decrease crying spells and thoughts of not being able to endure their suffering AEB using mindfulness, deep breathing, and TIP to increase tolerance for discomfort, discharge emotional distress, and increase their understanding that they can do hard things.  Client to utilize BSP (brainspotting) with therapist to help client identify and process triggers for their depressive episode and anxiety with goal of reducing said SUDs caused by depression/anxiety by 33% in the next 6 months.  Client to prioritize sleep 8+ hours each week night AEB going to bed by 10pm each night.   Barnie Del, LCSW, LCAS, CCTP, CCS-I, BSP

## 2021-03-23 ENCOUNTER — Ambulatory Visit: Payer: 59 | Admitting: Addiction (Substance Use Disorder)

## 2021-03-23 ENCOUNTER — Ambulatory Visit: Payer: 59 | Attending: Surgery

## 2021-03-23 ENCOUNTER — Other Ambulatory Visit: Payer: Self-pay

## 2021-03-23 VITALS — Wt 155.0 lb

## 2021-03-23 DIAGNOSIS — Z483 Aftercare following surgery for neoplasm: Secondary | ICD-10-CM | POA: Insufficient documentation

## 2021-03-23 NOTE — Therapy (Signed)
[facility] @ [address] [place], [place], [address] Phone: [phone]   Fax:  [phone]  Physical Therapy Treatment  Patient Details  Name: Michaela Roman MRN: [id number] Date of Birth: 10/16/1970 Referring Provider (PT): Dr. Erroll Luna   Encounter Date: 03/23/2021   PT End of Session - 03/23/21 0948     Visit Number 10   # unchanged due to screen only   PT Start Time 0946    PT Stop Time 0951    PT Time Calculation (min) 5 min    Activity Tolerance Patient tolerated treatment well    Behavior During Therapy [facility] for tasks assessed/performed             Past Medical History:  Diagnosis Date   Anxiety    Arthritis    OP   Breast cancer ([place])    Depression    Family history of breast cancer 02/13/2020   History of radiation therapy 04/17/20-06/03/20   IMRT to Left breast Dr. 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   Personal history of radiation therapy    Premature ovarian failure    Dx'd age 7   Thyroid disease    hypothyroid    Past Surgical History:  Procedure Laterality Date   BREAST BIOPSY Left 02/04/2020   breast and lymphnode   BREAST LUMPECTOMY Left 03/12/2020   BREAST LUMPECTOMY WITH RADIOACTIVE SEED AND SENTINEL LYMPH NODE BIOPSY Left 03/12/2020   Procedure: LEFT BREAST LUMPECTOMY WITH RADIOACTIVE SEED AND LEFT TARGETED RADIOACTIVE SEED LYMPH NODE BIOPSY AND LEFT SENTINEL LYMPH NODE MAPPING;  Surgeon: Erroll Luna, MD;  Location: [facility];  Service: General;  Laterality: Left;   DILATATION & CURETTAGE/HYSTEROSCOPY WITH MYOSURE N/A 10/07/2015   Procedure: DILATATION & CURETTAGE/HYSTEROSCOPY WITH MYOSURE;  Surgeon: Nunzio Cobbs, MD;  Location: [facility];  Service: Gynecology;  Laterality: N/A;  endometrial polyp   SCAR REVISION Left 02/05/2021   Procedure: left breast SCAR REVISION;  Surgeon: Erroll Luna, MD;  Location:  [place];  Service: General;  Laterality: Left;  60   WISDOM TOOTH EXTRACTION      Vitals:   03/23/21 0947  Weight: 155 lb (70.3 kg)     Subjective Assessment - 03/23/21 0947     Subjective Pt returns for her 3 month L-Dex screen. "I had a scar release surgery in December but I don't think it took."    Pertinent History Patient was diagnosed on 01/23/2020 with left 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 Patient underwent a left lumpectomy and sentinel node biopsy (1/4 positive) on 03/12/2020. It is ER/PR positive and HER2 negative with a Ki67 of 30%.                    L-DEX FLOWSHEETS - 03/23/21 0900       L-DEX LYMPHEDEMA SCREENING   Measurement Type Unilateral    L-DEX MEASUREMENT EXTREMITY Upper Extremity    POSITION  Standing    DOMINANT SIDE Right    At Risk Side Left    BASELINE SCORE (UNILATERAL) -0.7    L-DEX SCORE (UNILATERAL) -0.3    VALUE CHANGE (UNILAT) 0.4                                     PT Long Term  Goals - 06/04/20 1534       PT LONG TERM GOAL #1   Title Patient will demonstrate she has regained full shoulder ROM and function post operatively compared to baselines.    Time 4    Period Weeks    Status Achieved      PT LONG TERM GOAL #2   Title Patient will increase left shoulder flexion to >/= 150 degrees for increased ease reaching.    Time 4    Period Weeks    Status Achieved      PT LONG TERM GOAL #3   Title Patient will increase left shoulder abduction to >/= 165 degrees for increased ease reaching.    Time 4    Period Weeks    Status Achieved      PT LONG TERM GOAL #4   Title Patient will improve her DASH score to be </= 10 for improved overall uppre extremity function.    Baseline 0 today    Time 4    Period Weeks    Status Achieved      PT LONG TERM GOAL #5   Title Patient will report >/= 50% improvement in left axillary cording.    Time 4    Period Weeks    Status  Achieved      PT LONG TERM GOAL #6   Title Pt will be fit for class 1 sleeve/gauntlet and will demonstrate proper donning/doffing and review times to wear    Period Weeks    Status Achieved      PT LONG TERM GOAL #7   Title Pt will return demonstrate proper Self MLD to left breast    Time 1    Status Achieved      PT LONG TERM GOAL #8   Title Pt will be educated in [facility] strength program and will be independent in its performance    Time 6    Period Weeks    Status Achieved      PT LONG TERM GOAL  #9   TITLE Pt will maintain full shoulder ROM as she progresses through radiation    Time 6    Status Achieved                   Plan - 03/23/21 0950     Clinical Impression Statement Pt returns for her 3 month L-Dex screen. Her change from baseline of 0.4 is WNLs so no further treatment is required at this time except to cont every 3 month L-Dex screens which pt is agreeable to.    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

## 2021-04-05 ENCOUNTER — Other Ambulatory Visit: Payer: Self-pay | Admitting: Behavioral Health

## 2021-04-05 DIAGNOSIS — F330 Major depressive disorder, recurrent, mild: Secondary | ICD-10-CM

## 2021-04-05 DIAGNOSIS — F331 Major depressive disorder, recurrent, moderate: Secondary | ICD-10-CM

## 2021-04-05 DIAGNOSIS — F411 Generalized anxiety disorder: Secondary | ICD-10-CM

## 2021-04-05 DIAGNOSIS — F4323 Adjustment disorder with mixed anxiety and depressed mood: Secondary | ICD-10-CM

## 2021-04-05 DIAGNOSIS — F4311 Post-traumatic stress disorder, acute: Secondary | ICD-10-CM

## 2021-04-07 ENCOUNTER — Ambulatory Visit (INDEPENDENT_AMBULATORY_CARE_PROVIDER_SITE_OTHER): Payer: 59 | Admitting: Addiction (Substance Use Disorder)

## 2021-04-07 DIAGNOSIS — F411 Generalized anxiety disorder: Secondary | ICD-10-CM

## 2021-04-07 DIAGNOSIS — F428 Other obsessive-compulsive disorder: Secondary | ICD-10-CM | POA: Diagnosis not present

## 2021-04-07 NOTE — Progress Notes (Signed)
[facility] Counselor/Therapist Progress Note  Patient ID: Michaela Roman, MRN: [id number],    Date: 04/07/2021  Time Spent: 13min  Treatment Type: Individual Therapy  Reported Symptoms: stuck in fear  Mental Status Exam:  Appearance:   Casual     Behavior:  Appropriate and Sharing  Motor:  Normal  Speech/Language:   Clear and Coherent and Normal Rate  Affect:  Appropriate and Congruent  Mood:  anxious and irritable   Thought process:  normal  Thought content:    Obsessions and Rumination  Sensory/Perceptual disturbances:    WNL  Orientation:  x4  Attention:  Good  Concentration:  Good  Memory:  WNL  Fund of knowledge:   Good  Insight:    Good  Judgment:   Fair  Impulse Control:  Fair   Risk Assessment: Danger to Self:  No. Denied.  Self-injurious Behavior: No Danger to Others: No Duty to Warn:no Physical Aggression / Violence:No  Access to Firearms a concern: No  Gang Involvement:No   Virtual Visit via VIDEO: I connected with client by MyChart video enabled telemedicine/telehealth application, with their informed consent, and verified client privacy and that I am speaking with the correct person using two identifiers. I discussed the limitations, risks, security and privacy concerns of performing psychotherapy and management service virtually and confirmed their location. I also discussed with the patient that there may be a patient responsible charge related to this service and to confirm with the front desk if their insurance covers teletherapy. I also discussed with the patient the availability of in person appointments. The patient expressed understanding and agreed to proceed. I discussed the treatment planning with the client. The client was provided an opportunity to ask questions and all were answered. The client agreed with the plan and demonstrated an understanding of the instructions. The client was advised to call our office if symptoms worsen or  feel they are in a crisis state and need immediate contact. Client also reminded of a crisis line number and to use 9-1-1 if there's an emergency.  Therapist Location: home; Client Location: home.  Subjective: Client reported feeling stuck in her fear of others expectations of her, including of her husband's expectations of her. Client processed her desire to say no to attending some events with him, but her inner guilt when he didn't approve or later brought it up with her. Therapist used MI to validate clients feelings and support her as she processes her own feelings and to challenge irrational feelings. Therapist also taught client the inquirer/instigator technique to help her and her husband be curious about each others' feelings/ desires and come to only reasonable expectations. Client irritable that her conversation led to expectations of others instead of processing her own fears and stated her desire to focus on that next session. Therapist assessed for stability, & client denied SI/HI/AVH.  Interventions: Cognitive Behavioral Therapy, Motivational Interviewing, and RPT     Diagnosis:   ICD-10-CM   1. GAD (generalized anxiety disorder)  F41.1     2. 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  Client is to continue seeing medication provider for support of mood management.   Client to engage in CBT: challenging negative internal ruminations and self-talk AEB journaling daily or expressing thoughts to support persons in their life and then challenging it with truth.  Client to  engage in tapping to tap in healthy cognition that challenges negative rumination or deep negative core belief.  Client to practice DBT distress tolerance skills to decrease crying spells and thoughts of not being able to endure their suffering AEB using mindfulness, deep breathing, and TIP to increase tolerance for  discomfort, discharge emotional distress, and increase their understanding that they can do hard things.  Client to utilize BSP (brainspotting) with therapist to help client identify and process triggers for their depressive episode and anxiety with goal of reducing said SUDs caused by depression/anxiety by 33% in the next 6 months.  Client to prioritize sleep 8+ hours each week night AEB going to bed by 10pm each night.   Barnie Del, LCSW, LCAS, CCTP, CCS-I, BSP

## 2021-04-15 ENCOUNTER — Ambulatory Visit (INDEPENDENT_AMBULATORY_CARE_PROVIDER_SITE_OTHER): Payer: 59 | Admitting: Behavioral Health

## 2021-04-15 ENCOUNTER — Encounter: Payer: Self-pay | Admitting: Behavioral Health

## 2021-04-15 ENCOUNTER — Other Ambulatory Visit: Payer: Self-pay

## 2021-04-15 DIAGNOSIS — F4311 Post-traumatic stress disorder, acute: Secondary | ICD-10-CM

## 2021-04-15 DIAGNOSIS — F411 Generalized anxiety disorder: Secondary | ICD-10-CM | POA: Diagnosis not present

## 2021-04-15 DIAGNOSIS — F330 Major depressive disorder, recurrent, mild: Secondary | ICD-10-CM

## 2021-04-15 DIAGNOSIS — F428 Other obsessive-compulsive disorder: Secondary | ICD-10-CM | POA: Diagnosis not present

## 2021-04-15 DIAGNOSIS — F5105 Insomnia due to other mental disorder: Secondary | ICD-10-CM

## 2021-04-15 DIAGNOSIS — F4323 Adjustment disorder with mixed anxiety and depressed mood: Secondary | ICD-10-CM

## 2021-04-15 DIAGNOSIS — F99 Mental disorder, not otherwise specified: Secondary | ICD-10-CM

## 2021-04-15 NOTE — Progress Notes (Addendum)
[facility] Med Check  Patient ID: Michaela Roman,  MRN: [id number]  PCP: Glenis Smoker, MD  Date of Evaluation: 04/15/2021 Time spent:30 minutes  Chief Complaint:  Chief Complaint   Anxiety; Depression; Trauma; Follow-up; Medication Refill; Patient Education     HISTORY/CURRENT STATUS: HPI  51 year old female presents to this office for follow up and medication management. She continue to report Viibryd is working well for anxiety and depression. She is having some problems with waking up at night interrupting sleep. She would like to restart her Lunesta and try 2 mg. She is reporting anxiety today at 2/10 and depression at 1/10. She has had some success with trying CBT for sleep. She is getting about 7-8 hours per night.  No mania, no psychosis. No SI/HI. Continues with Sammuel Cooper for therapy.      Wellbutrin- Insomnia, racing thoughts Stopped after 2 weeks.   Effexor-pt said was not controlling depression Zoloft- Took for a few weeks. Had insomnia. Developed hives.  Lexapro Lorazepam- Had a calming effect. Not effective with decreasing middle of the night awakenings. Melatonin- Vivid dreams Trazodone- Felt sedated but did not sleep.  Lunesta 1mg - said was not effective     Individual Medical History/ Review of Systems: Changes? :No   Allergies: Sertraline  Current Medications:  Current Outpatient Medications:    calcium-vitamin D (OSCAL WITH D) 500-200 MG-UNIT tablet, Take 1 tablet by mouth., Disp: , Rfl:    gabapentin (NEURONTIN) 300 MG capsule, Take 300 mg by mouth 3 (three) times daily., Disp: , Rfl:    HYDROcodone-acetaminophen (NORCO/VICODIN) 5-325 MG tablet, Take 1 tablet by mouth every 6 (six) hours as needed for moderate pain., Disp: 15 tablet, Rfl: 0   ibuprofen (ADVIL) 800 MG tablet, Take 1 tablet (800 mg total) by mouth every 8 (eight) hours as needed., Disp: 30 tablet, Rfl: 0   levothyroxine (SYNTHROID) 112 MCG tablet, Take 1 tablet by mouth  daily before breakfast., Disp: 90 tablet, Rfl: 0   Multiple Vitamins-Minerals (MULTIVITAMIN PO), Take 1 tablet by mouth daily., Disp: , Rfl:    tamoxifen (NOLVADEX) 20 MG tablet, Take 1 tablet (20 mg total) by mouth daily., Disp: 30 tablet, Rfl: 6   Vilazodone HCl 20 MG TABS, TAKE ONE TABLET BY MOUTH ONE TIME DAILY, Disp: 30 tablet, Rfl: 0 Medication Side Effects: none  Family Medical/ Social History: Changes? No  MENTAL HEALTH EXAM:  Last menstrual period 08/07/2015.There is no height or weight on file to calculate BMI.  General Appearance: Casual and Neat  Eye Contact:  Good  Speech:  Clear and Coherent  Volume:  Normal  Mood:  NA  Affect:  Appropriate  Thought Process:  Coherent  Orientation:  Full (Time, Place, and Person)  Thought Content: Logical   Suicidal Thoughts:  No  Homicidal Thoughts:  No  Memory:  WNL  Judgement:  Good  Insight:  Good  Psychomotor Activity:  Normal  Concentration:  Concentration: Good  Recall:  Good  Fund of Knowledge: Good  Language: Good  Assets:  Desire for Improvement  ADL's:  Intact  Cognition: WNL  Prognosis:  Good    DIAGNOSES:    ICD-10-CM   1. GAD (generalized anxiety disorder)  F41.1     2. 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 Acute posttraumatic stress disorder  F43.11     4. Mild episode of recurrent major depressive disorder ([facility])  F33.0     5. Adjustment disorder with mixed anxiety and depressed  mood  F43.23     6. Insomnia due to other mental disorder  F51.05    F99       Receiving Psychotherapy: yes   RECOMMENDATIONS:  Greater than 50% of 30 face to face time with patient was spent on counseling and coordination of care.  We discussed her recent improvement with anxiety and depression. Educated on proper sleep hygiene and alternative sleeping medication.  Continue Viibryd 20 mg daily She will reinitiate Lunesta 2 mg  at bedtime for sleep To report and side effects or worsening symptoms Provided emergency contact  information Reviewed [place], NP

## 2021-04-16 ENCOUNTER — Other Ambulatory Visit: Payer: 59

## 2021-04-16 ENCOUNTER — Ambulatory Visit: Admission: RE | Admit: 2021-04-16 | Payer: 59 | Source: Ambulatory Visit

## 2021-04-21 ENCOUNTER — Other Ambulatory Visit: Payer: Self-pay

## 2021-04-21 ENCOUNTER — Ambulatory Visit (INDEPENDENT_AMBULATORY_CARE_PROVIDER_SITE_OTHER): Payer: 59 | Admitting: Addiction (Substance Use Disorder)

## 2021-04-21 DIAGNOSIS — F428 Other obsessive-compulsive disorder: Secondary | ICD-10-CM

## 2021-04-21 DIAGNOSIS — F4323 Adjustment disorder with mixed anxiety and depressed mood: Secondary | ICD-10-CM | POA: Diagnosis not present

## 2021-04-21 NOTE — Progress Notes (Signed)
°      [facility] Counselor/Therapist Progress Note  Patient ID: MASSIEL STIPP, MRN: [id number],    Date: 04/21/2021  Time Spent: 85mins  Treatment Type: Individual Therapy  Reported Symptoms: afraid, in shock   Mental Status Exam:  Appearance:   Casual     Behavior:  Appropriate and Sharing  Motor:  Normal  Speech/Language:   Clear and Coherent and Normal Rate  Affect:  Appropriate and Congruent  Mood:  anxious and irritable   Thought process:  normal  Thought content:    Obsessions and Rumination  Sensory/Perceptual disturbances:    WNL  Orientation:  x4  Attention:  Good  Concentration:  Good  Memory:  WNL  Fund of knowledge:   Good  Insight:    Good  Judgment:   Fair  Impulse Control:  Fair   Risk Assessment: Danger to Self:  No. Denied.  Self-injurious Behavior: No Danger to Others: No Duty to Warn:no Physical Aggression / Violence:No  Access to Firearms a concern: No  Gang Involvement:No    Subjective: Client walked and reported hard news she found out about her mom ie: having found cervical cells and needing to get a hysterectomy. Client was in shock at first until the news hit her and broke her emotionally and made her feel despair. Client processed over-thinking it all and getting anxious about losing her (jumping to a conclusions). Therapist used MI & CBT to support client in processing her thoughts and fears and challenge the intrusive thoughts. Therapist also using grief therapy to normalize her feelings and give her space to feel and process the news of her aging mom. Therapist assessed for stability, & client denied SI/HI/AVH.  Interventions: Cognitive Behavioral Therapy, Motivational Interviewing, Grief Therapy, and RPT     Diagnosis:   ICD-10-CM   1. Adjustment disorder with mixed anxiety and depressed mood  F43.23     2. 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  Client is to continue seeing medication provider for support of mood management.   Client to engage in CBT: challenging negative internal ruminations and self-talk AEB journaling daily or expressing thoughts to support persons in their life and then challenging it with truth.  Client to engage in tapping to tap in healthy cognition that challenges negative rumination or deep negative core belief.  Client to practice DBT distress tolerance skills to decrease crying spells and thoughts of not being able to endure their suffering AEB using mindfulness, deep breathing, and TIP to increase tolerance for discomfort, discharge emotional distress, and increase their understanding that they can do hard things.  Client to utilize BSP (brainspotting) with therapist to help client identify and process triggers for their depressive episode and anxiety with goal of reducing said SUDs caused by depression/anxiety by 33% in the next 6 months.  Client to prioritize sleep 8+ hours each week night AEB going to bed by 10pm each night.   Barnie Del, LCSW, LCAS, CCTP, CCS-I, BSP

## 2021-04-22 IMAGING — MG MM BREAST LOCALIZATION CLIP
6 series · 6 of 18 positions shown · non-contrast
Comparison: Previous exam(s).

CLINICAL DATA: Patient presents for radioactive seed localization
of a left breast carcinoma and a metastatic left axillary lymph
node.

EXAM:
DIAGNOSTIC LEFT MAMMOGRAM POST ULTRASOUND-GUIDED RADIOACTIVE SEED
PLACEMENT: 2 SEEDS PLACED, 1 IN THE LEFT BREAST CARCINOMA AND THE
OTHER IN THE METASTATIC LEFT AXILLARY LYMPH NODE.

[L MLO synth-2D]
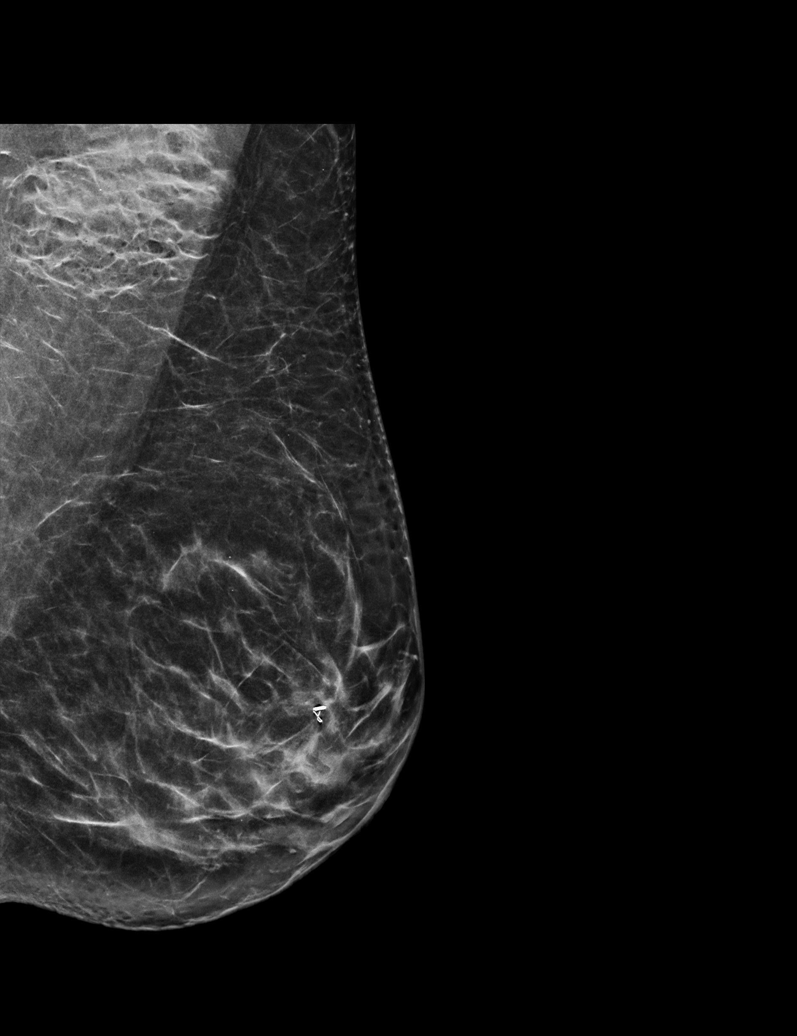

[L CC synth-2D]
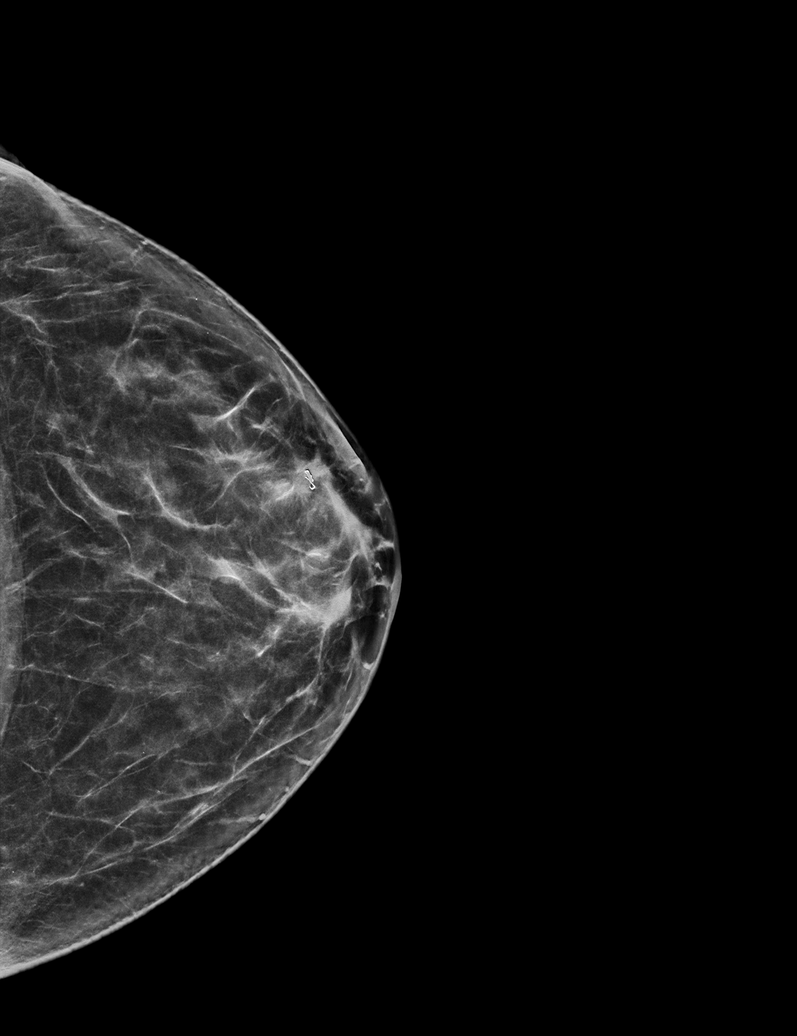

[L ML synth-2D]
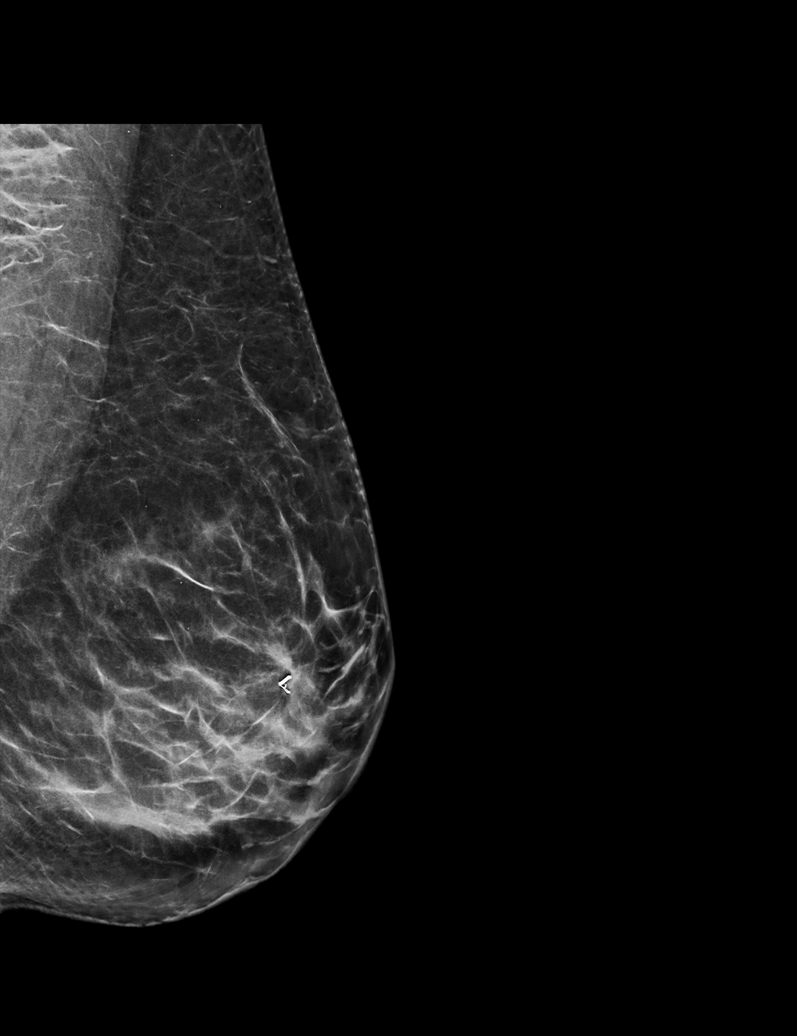

[L ML tomo · tomo slice 33/64.0]
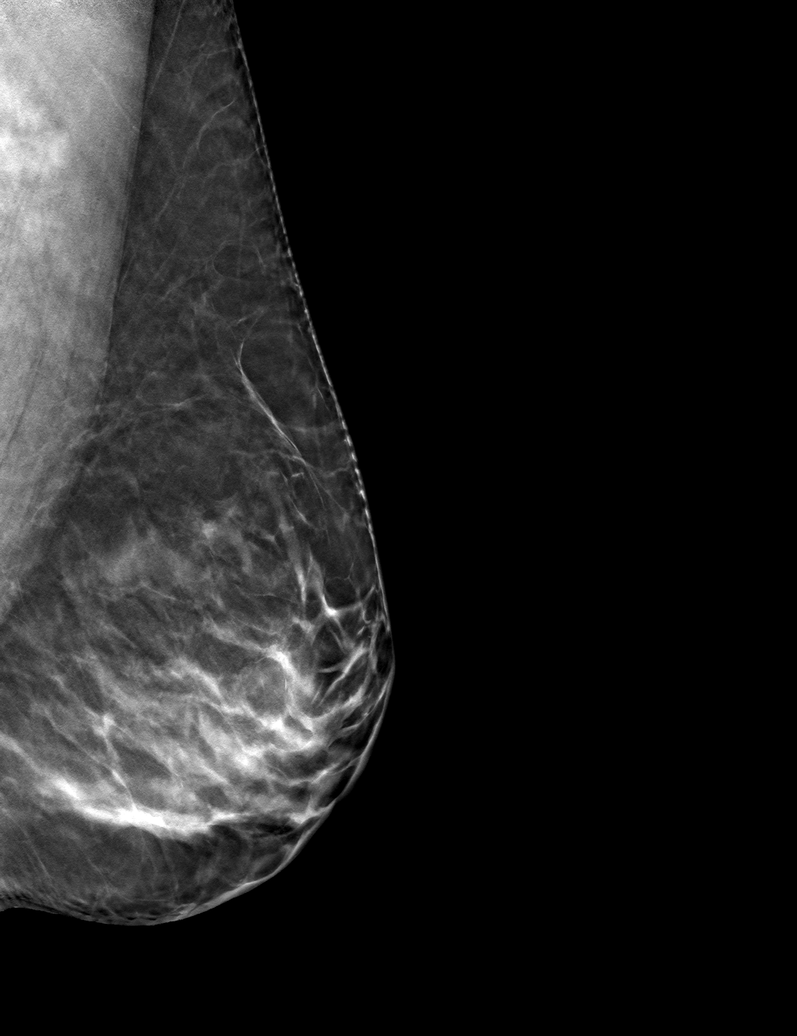

[L CC tomo · tomo slice 33/65.0]
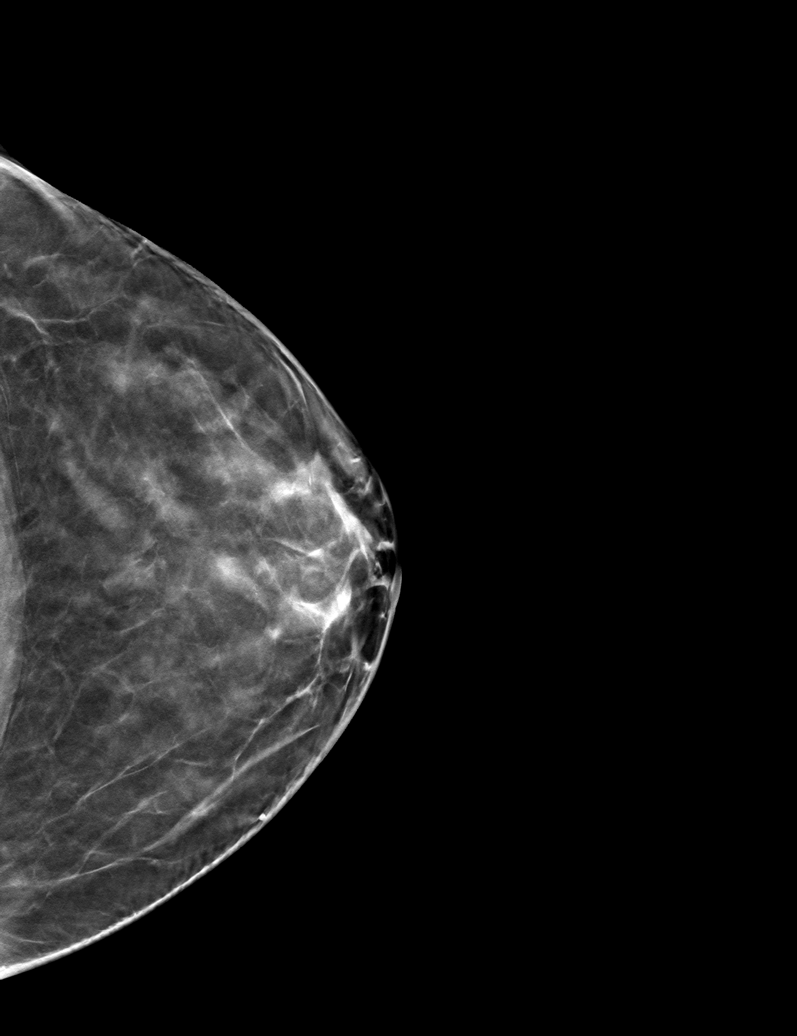

[L MLO tomo · tomo slice 33/64.0]
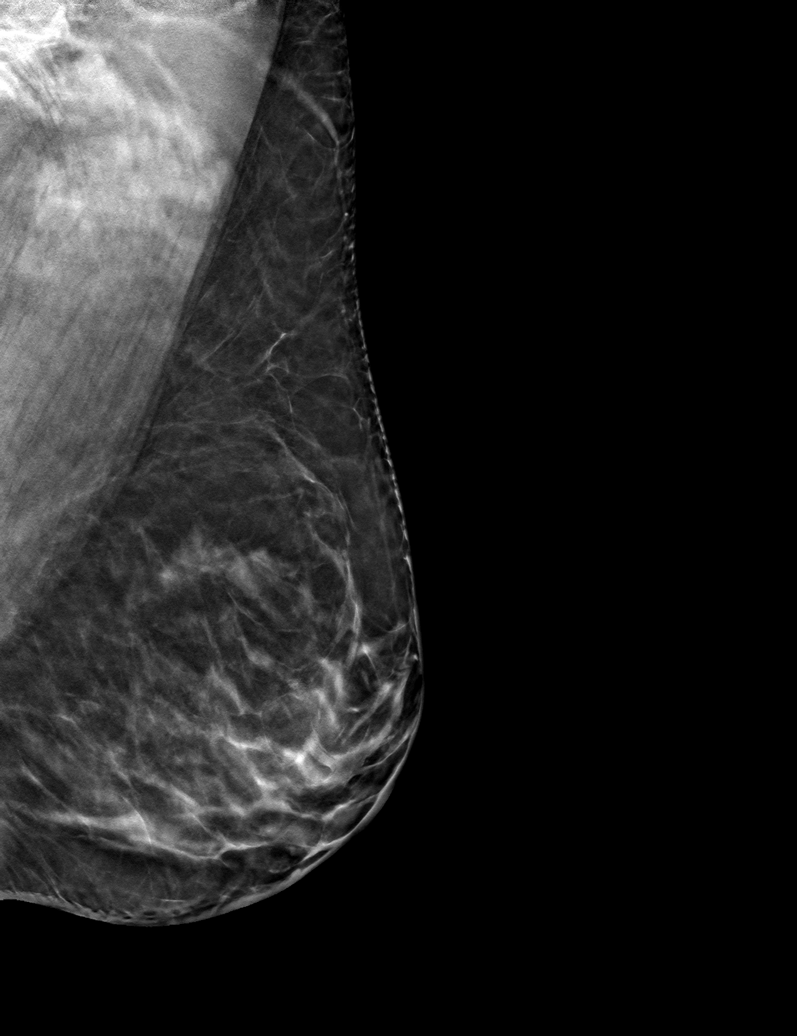

[6 of 18 positions shown; findings below may reference images not displayed]

FINDINGS: Mammographic images were obtained following ultrasound-guided
radioactive seed placement. These demonstrate both radioactive seeds
well positioned adjacent to the post biopsy marker clips. In the
anterior upper outer left breast the seed lies directly adjacent to
the ribbon clip localizing the left breast carcinoma. In the left
axilla, the seed lies adjacent to the HydroMARK clip localizing
metastatic left axillary lymph node.
IMPRESSION: Appropriate location of both radioactive seeds in the left breast
and left axilla.

Final Assessment: Post Procedure Mammograms for Seed Placement

## 2021-04-22 IMAGING — US US PLC BREAST LOC DEV 1ST LESION INC US GUIDE*L*
1 series · 4 of 4 positions shown · non-contrast
Comparison: Previous exam(s).

CLINICAL DATA: Patient presents for radioactive seed localization a
left breast carcinoma and metastatic left axillary lymph node prior
to surgical excision.

EXAM:
ULTRASOUND GUIDED RADIOACTIVE SEED LOCALIZATION OF THE LEFT BREAST
AND LEFT AXILLA. TWO SEEDS PLACED.

[Series 1: us plc breast loc dev 1st lesion inc us guide*left · 0.06mm/px · 4 of 4 slices shown]
[im 1/4]
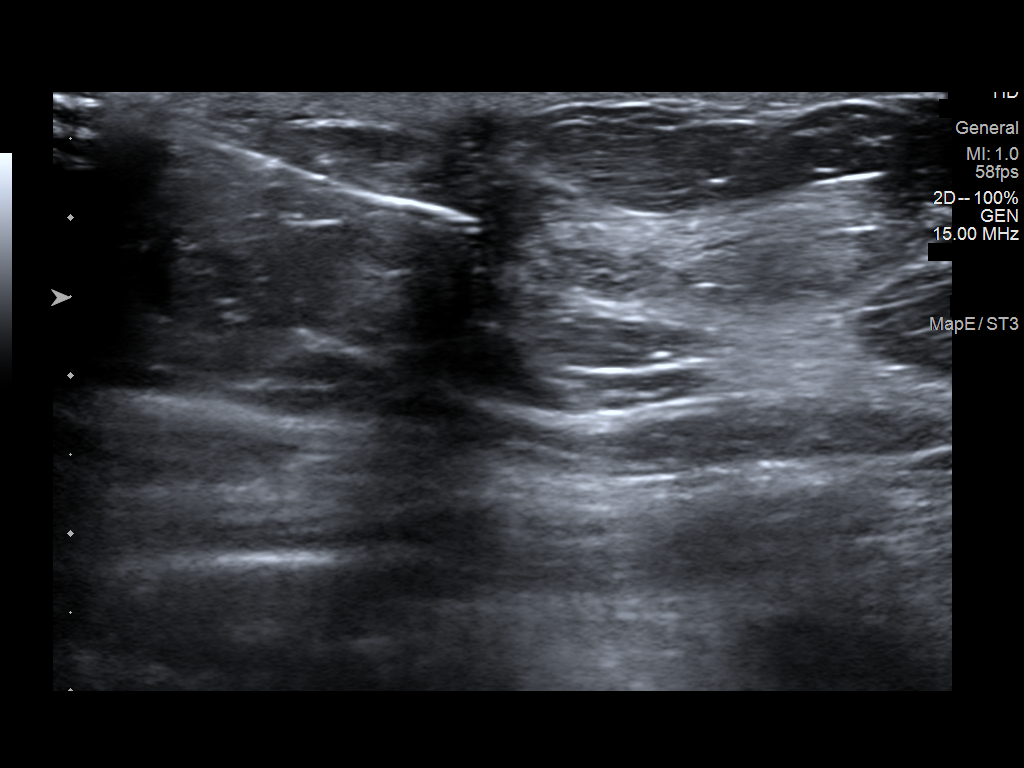
[im 2/4]
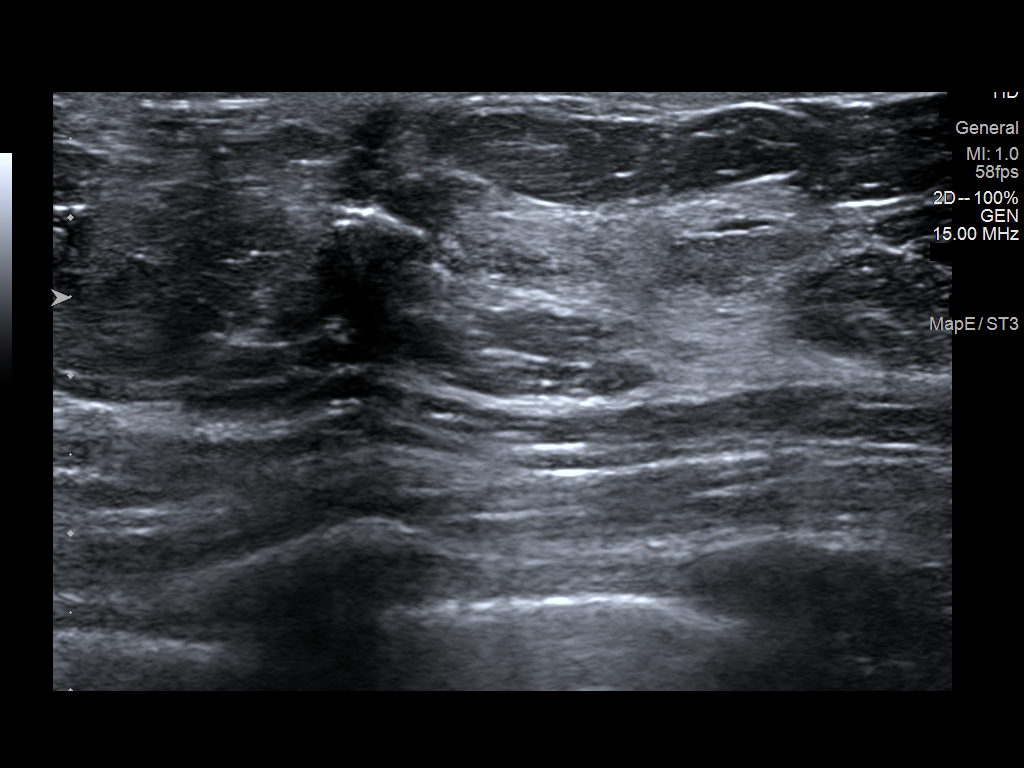
[im 3/4]
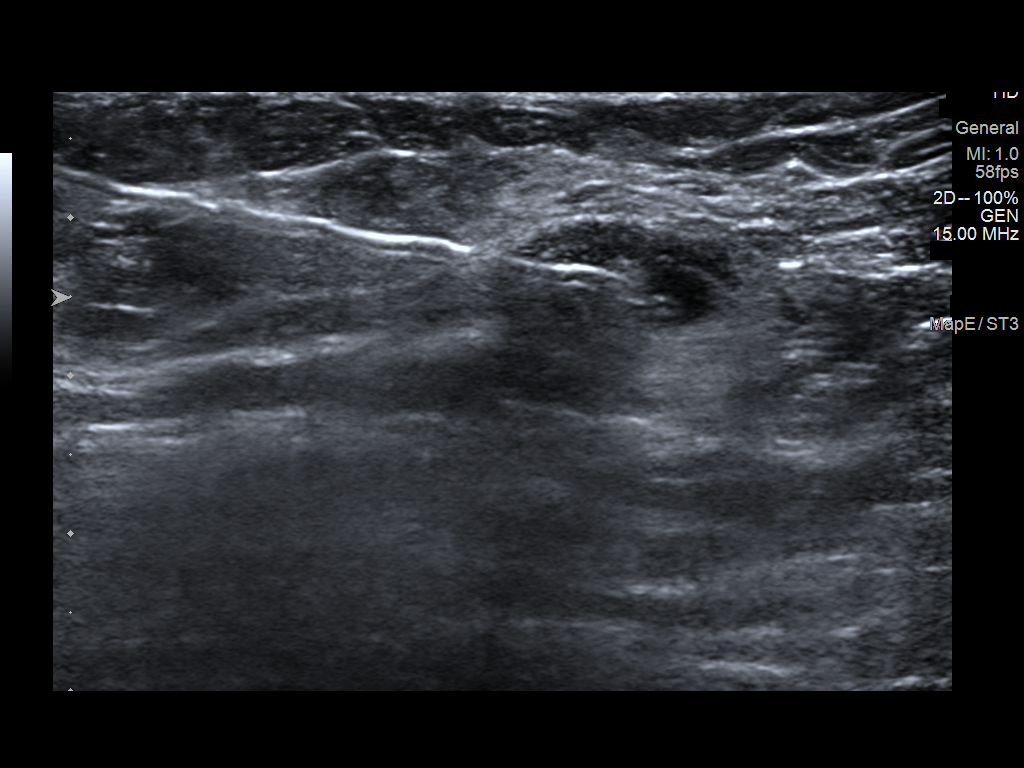
[im 4/4]
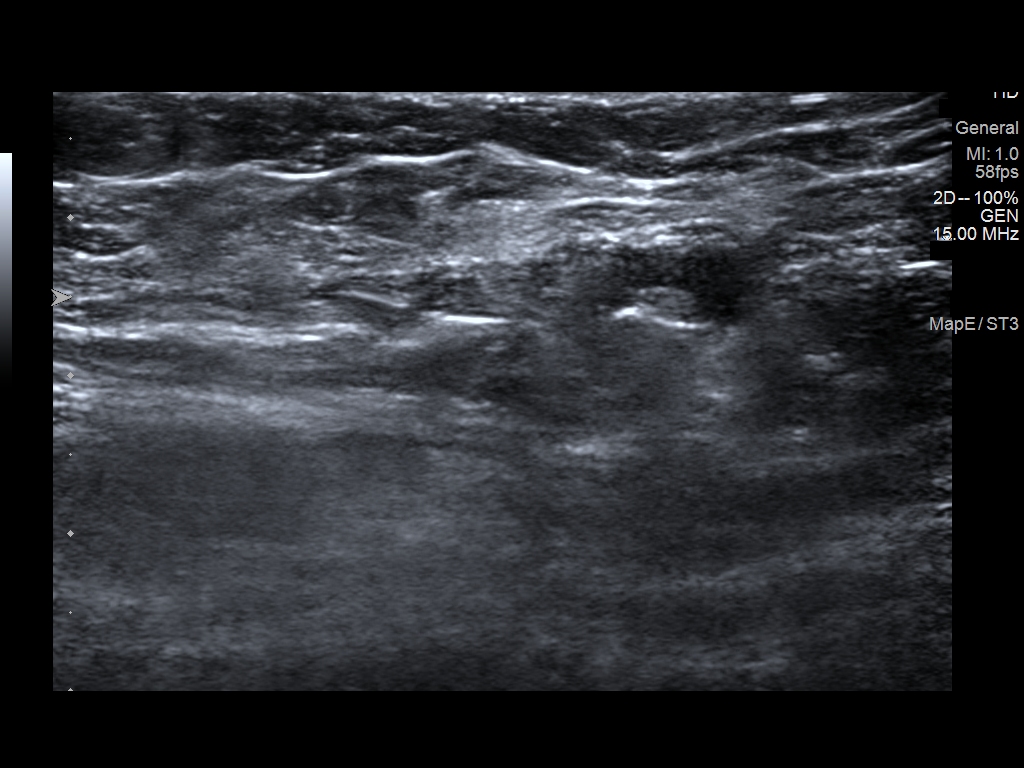

[4 of 4 positions shown; findings below may reference images not displayed]

FINDINGS: Patient presents for radioactive seed localization prior to surgical
excision. I met with the patient and we discussed the procedure of
seed localization including benefits and alternatives. We discussed
the high likelihood of a successful procedure. We discussed the
risks of the procedure including infection, bleeding, tissue injury
and further surgery. We discussed the low dose of radioactivity
involved in the procedure. Informed, written consent was given.

The usual time-out protocol was performed immediately prior to the
procedure.

Breast Mass.

Using ultrasound guidance, sterile technique, 1% lidocaine and an
Y-ADS radioactive seed, the 2 o'clock position, 1 cm from the
nipple, carcinoma was localized using an inferior approach. The
follow-up mammogram images confirm the seed in the expected location
and were marked for Dr. Elizbeta she.

Follow-up survey of the patient confirms presence of the radioactive
seed.

Order number of Y-ADS seed:  [id number].

Total activity:  0.248 millicuries reference Date: 03/04/2020

Lymph Node.

Using ultrasound guidance, sterile technique, 1% lidocaine and an
Y-ADS radioactive seed, the metastatic left axillary lymph node was
localized using an inferior approach. The follow-up mammogram images
confirm the seed in the expected location and were marked for Dr.
Elizbeta.

Follow-up survey of the patient confirms presence of the radioactive
seed.

Order number of Y-ADS seed:  [id number].

Total activity:  0.248 millicuries reference Date: 03/04/2020

The patient tolerated the procedure well and was released from the
[REDACTED]. She was given instructions regarding seed removal.
IMPRESSION: Radioactive seed localization a carcinoma of the left breast and a
metastatic left axillary lymph node. No apparent complications.

## 2021-05-01 ENCOUNTER — Other Ambulatory Visit: Payer: Self-pay | Admitting: Behavioral Health

## 2021-05-01 DIAGNOSIS — F4323 Adjustment disorder with mixed anxiety and depressed mood: Secondary | ICD-10-CM

## 2021-05-01 DIAGNOSIS — F331 Major depressive disorder, recurrent, moderate: Secondary | ICD-10-CM

## 2021-05-01 DIAGNOSIS — F4311 Post-traumatic stress disorder, acute: Secondary | ICD-10-CM

## 2021-05-01 DIAGNOSIS — F411 Generalized anxiety disorder: Secondary | ICD-10-CM

## 2021-05-01 DIAGNOSIS — F330 Major depressive disorder, recurrent, mild: Secondary | ICD-10-CM

## 2021-05-06 ENCOUNTER — Ambulatory Visit (INDEPENDENT_AMBULATORY_CARE_PROVIDER_SITE_OTHER): Payer: 59 | Admitting: Addiction (Substance Use Disorder)

## 2021-05-06 ENCOUNTER — Other Ambulatory Visit: Payer: Self-pay

## 2021-05-06 DIAGNOSIS — F411 Generalized anxiety disorder: Secondary | ICD-10-CM | POA: Diagnosis not present

## 2021-05-06 NOTE — Progress Notes (Signed)
?      [facility] Counselor/Therapist Progress Note ? ?Patient ID: Michaela Roman, MRN: [id number],   ? ?Date: 05/06/2021 ? ?Time Spent: 53 min ? ?Treatment Type: Individual Therapy ? ?Reported Symptoms: sad, scared ? ?Mental Status Exam: ? ?Appearance:   Casual     ?Behavior:  Appropriate and Sharing  ?Motor:  Normal  ?Speech/Language:   Clear and Coherent and Normal Rate  ?Affect:  Appropriate, Congruent, and Tearful  ?Mood:  anxious and sad   ?Thought process:  normal  ?Thought content:    Obsessions and Rumination  ?Sensory/Perceptual disturbances:    WNL  ?Orientation:  x4  ?Attention:  Good  ?Concentration:  Good  ?Memory:  WNL  ?Fund of knowledge:   Good  ?Insight:    Good  ?Judgment:   Good  ?Impulse Control:  Good  ? ?Risk Assessment: ?Danger to Self:  No. Denied.  ?Self-injurious Behavior: No ?Danger to Others: No ?Duty to Warn:no ?Physical Aggression / Violence:No  ?Access to Firearms a concern: No  ?Gang Involvement:No  ? ? ?Subjective: Client dealing with the grief of her mom's endometrial cancer diagnosis. Client going to travel up for her mom's surgery later this week and is very anxious. Client dealing with processing the news in a different way than her siblings. Therapist used MI & Grief therapy with client to validate client's fear and help support her in processing the grief of the news and her thoughts about her mom going through cancer. Therapist used MI & CBT to support client in processing her thoughts and fears and challenge the intrusive thoughts. Therapist assessed for stability, & client denied SI/HI/AVH. ? ?Interventions: Cognitive Behavioral Therapy, Motivational Interviewing, Grief Therapy, and RPT    ? ?Diagnosis: ?No diagnosis found. ? ? ?Plan of Care: ?Client to return for weekly therapy with Sammuel Cooper, therapist, to review again in 6 months.  ?Client is to continue seeing medication provider for support of mood management.   ?Client to engage in CBT: challenging negative  internal ruminations and self-talk AEB journaling daily or expressing thoughts to support persons in their life and then challenging it with truth.  ?Client to engage in tapping to tap in healthy cognition that challenges negative rumination or deep negative core belief.  ?Client to practice DBT distress tolerance skills to decrease crying spells and thoughts of not being able to endure their suffering AEB using mindfulness, deep breathing, and TIP to increase tolerance for discomfort, discharge emotional distress, and increase their understanding that they can do hard things.  ?Client to utilize BSP (brainspotting) with therapist to help client identify and process triggers for their depressive episode and anxiety with goal of reducing said SUDs caused by depression/anxiety by 33% in the next 6 months.  ?Client to prioritize sleep 8+ hours each week night AEB going to bed by 10pm each night.  ? ?Barnie Del, LCSW, LCAS, CCTP, CCS-I, BSP ?

## 2021-05-19 ENCOUNTER — Ambulatory Visit (INDEPENDENT_AMBULATORY_CARE_PROVIDER_SITE_OTHER): Payer: 59 | Admitting: Addiction (Substance Use Disorder)

## 2021-05-19 ENCOUNTER — Other Ambulatory Visit: Payer: Self-pay

## 2021-05-19 DIAGNOSIS — F4323 Adjustment disorder with mixed anxiety and depressed mood: Secondary | ICD-10-CM | POA: Diagnosis not present

## 2021-05-19 NOTE — Progress Notes (Signed)
?      [facility] Counselor/Therapist Progress Note ? ?Patient ID: Michaela Roman, MRN: [id number],   ? ?Date: 05/19/2021 ? ?Time Spent: 47mns ? ?Treatment Type: Individual Therapy ? ?Reported Symptoms: relieved mom is okay now but also had a trauma response & saddened by that.  ? ?Mental Status Exam: ? ?Appearance:   Casual     ?Behavior:  Appropriate and Sharing  ?Motor:  Normal  ?Speech/Language:   Clear and Coherent and Normal Rate  ?Affect:  Appropriate, Congruent, and Tearful  ?Mood:  sad   ?Thought process:  normal  ?Thought content:    Obsessions and Rumination  ?Sensory/Perceptual disturbances:    WNL  ?Orientation:  x4  ?Attention:  Good  ?Concentration:  Good  ?Memory:  WNL  ?Fund of knowledge:   Good  ?Insight:    Good  ?Judgment:   Good  ?Impulse Control:  Good  ? ?Risk Assessment: ?Danger to Self:  No. Denied.  ?Self-injurious Behavior: No ?Danger to Others: No ?Duty to Warn:no ?Physical Aggression / Violence:No  ?Access to Firearms a concern: No  ?Gang Involvement:No  ? ? ?Subjective: Client processed her visit to her mom who had a hysterectomy from uterine cancer. Client feels relieved her mom is cancer free however, Client felt traumatized by seeing her mom deal with the cancer diagnosis and feel flashbacks from her cancer txt. Therapist used MI & grief therapy to validate client's trauma response and support her as she processed her grief. Client reported how she her emotions  unraveled when she got home to her safe place with her husband, who helped her emotionally ground. Therapist used BSP with client to help her process the trauma and grief until her SUDs are reduced in her solar plexus. Therapist assessed for stability, & client denied SI/HI/AVH. ? ?Interventions: Cognitive Behavioral Therapy, Motivational Interviewing, Grief Therapy, and RPT &BSP    ? ?Diagnosis: ?  ICD-10-CM   ?1. Adjustment disorder with mixed anxiety and depressed mood  F43.23   ?  ? ? ?Plan of Care: ?Client to  return for weekly therapy with KSammuel Cooper therapist, to review again in 6 months.  ?Client is to continue seeing medication provider for support of mood management.   ?Client to engage in CBT: challenging negative internal ruminations and self-talk AEB journaling daily or expressing thoughts to support persons in their life and then challenging it with truth.  ?Client to engage in tapping to tap in healthy cognition that challenges negative rumination or deep negative core belief.  ?Client to practice DBT distress tolerance skills to decrease crying spells and thoughts of not being able to endure their suffering AEB using mindfulness, deep breathing, and TIP to increase tolerance for discomfort, discharge emotional distress, and increase their understanding that they can do hard things.  ?Client to utilize BSP (brainspotting) with therapist to help client identify and process triggers for their depressive episode and anxiety with goal of reducing said SUDs caused by depression/anxiety by 33% in the next 6 months.  ?Client to prioritize sleep 8+ hours each week night AEB going to bed by 10pm each night.  ? ?KBarnie Del LCSW, LCAS, CCTP, CCS-I, BSP ?

## 2021-06-02 ENCOUNTER — Ambulatory Visit (INDEPENDENT_AMBULATORY_CARE_PROVIDER_SITE_OTHER): Payer: 59 | Admitting: Addiction (Substance Use Disorder)

## 2021-06-02 DIAGNOSIS — F4323 Adjustment disorder with mixed anxiety and depressed mood: Secondary | ICD-10-CM | POA: Diagnosis not present

## 2021-06-02 NOTE — Progress Notes (Signed)
?      [facility] Counselor/Therapist Progress Note ? ?Patient ID: LING FLESCH, MRN: [id number],   ? ?Date: 06/02/2021 ? ?Time Spent: 82mns ? ?Treatment Type: Individual Therapy ? ?Reported Symptoms: anxious, self esteem.  ? ?Mental Status Exam: ? ?Appearance:   Casual     ?Behavior:  Appropriate and Sharing  ?Motor:  Normal  ?Speech/Language:   Clear and Coherent and Normal Rate  ?Affect:  Appropriate and Congruent  ?Mood:  anxious   ?Thought process:  normal  ?Thought content:    Obsessions and Rumination  ?Sensory/Perceptual disturbances:    WNL  ?Orientation:  x4  ?Attention:  Good  ?Concentration:  Good  ?Memory:  WNL  ?Fund of knowledge:   Good  ?Insight:    Good  ?Judgment:   Good  ?Impulse Control:  Good  ? ?Risk Assessment: ?Danger to Self:  No. Denied.  ?Self-injurious Behavior: No ?Danger to Others: No ?Duty to Warn:no ?Physical Aggression / Violence:No  ?Access to Firearms a concern: No  ?Gang Involvement:No  ? ? ?Subjective: Client processed her forgetfulness in processing further after her last session. Client having trouble accepting her body as it is and what it has experienced. Client processed ongoing health issues and her feeling like her body is not good- the way it looks and the ways it hasn't worked well/functioned. Client struggling her judge herself and therapist used MI & CBT with client to validate her pain and struggle with her judgmental thoughts. Therapist assessed for stability, & client denied SI/HI/AVH. ? ?Interventions: Cognitive Behavioral Therapy, Motivational Interviewing, and RPT    ? ?Diagnosis: ?  ICD-10-CM   ?1. Adjustment disorder with mixed anxiety and depressed mood  F43.23   ?  ? ? ?Plan of Care: ?Client to process her anxieties using bilateral music and a support person's presence.  ?Client to return for weekly therapy with KSammuel Cooper therapist, to review again in 6 months.  ?Client is to continue seeing medication provider for support of mood management.    ?Client to engage in CBT: challenging negative internal ruminations and self-talk AEB journaling daily or expressing thoughts to support persons in their life and then challenging it with truth.  ?Client to engage in tapping to tap in healthy cognition that challenges negative rumination or deep negative core belief.  ?Client to practice DBT distress tolerance skills to decrease crying spells and thoughts of not being able to endure their suffering AEB using mindfulness, deep breathing, and TIP to increase tolerance for discomfort, discharge emotional distress, and increase their understanding that they can do hard things.  ?Client to utilize BSP (brainspotting) with therapist to help client identify and process triggers for their depressive episode and anxiety with goal of reducing said SUDs caused by depression/anxiety by 33% in the next 6 months.  ?Client to prioritize sleep 8+ hours each week night AEB going to bed by 10pm each night.  ? ?KBarnie Del LCSW, LCAS, CCTP, CCS-I, BSP ?

## 2021-06-03 ENCOUNTER — Ambulatory Visit (INDEPENDENT_AMBULATORY_CARE_PROVIDER_SITE_OTHER): Payer: 59

## 2021-06-03 DIAGNOSIS — E28319 Asymptomatic premature menopause: Secondary | ICD-10-CM

## 2021-06-03 DIAGNOSIS — M8580 Other specified disorders of bone density and structure, unspecified site: Secondary | ICD-10-CM

## 2021-06-04 ENCOUNTER — Other Ambulatory Visit: Payer: Self-pay

## 2021-06-04 MED ORDER — TAMOXIFEN CITRATE 20 MG PO TABS: 20.0000 mg | ORAL_TABLET | Freq: Every day | ORAL | 3 refills | Status: DC

## 2021-06-07 ENCOUNTER — Other Ambulatory Visit: Payer: Self-pay | Admitting: Behavioral Health

## 2021-06-07 DIAGNOSIS — F331 Major depressive disorder, recurrent, moderate: Secondary | ICD-10-CM

## 2021-06-07 DIAGNOSIS — F4323 Adjustment disorder with mixed anxiety and depressed mood: Secondary | ICD-10-CM

## 2021-06-07 DIAGNOSIS — F330 Major depressive disorder, recurrent, mild: Secondary | ICD-10-CM

## 2021-06-07 DIAGNOSIS — F4311 Post-traumatic stress disorder, acute: Secondary | ICD-10-CM

## 2021-06-07 DIAGNOSIS — F411 Generalized anxiety disorder: Secondary | ICD-10-CM

## 2021-06-10 ENCOUNTER — Telehealth: Payer: Self-pay | Admitting: Behavioral Health

## 2021-06-10 NOTE — Telephone Encounter (Signed)
Aaron Edelman RF at 1:05. Patient was notified.  ?

## 2021-06-10 NOTE — Telephone Encounter (Signed)
Pt called to request Vilazodone RF. She is just going to be out and needs to get it.  ?

## 2021-06-16 ENCOUNTER — Ambulatory Visit (INDEPENDENT_AMBULATORY_CARE_PROVIDER_SITE_OTHER): Payer: 59 | Admitting: Addiction (Substance Use Disorder)

## 2021-06-16 DIAGNOSIS — F330 Major depressive disorder, recurrent, mild: Secondary | ICD-10-CM

## 2021-06-16 DIAGNOSIS — F428 Other obsessive-compulsive disorder: Secondary | ICD-10-CM

## 2021-06-16 NOTE — Progress Notes (Signed)
?      [facility] Counselor/Therapist Progress Note ? ?Patient ID: Michaela Roman, MRN: [id number],   ? ?Date: 06/16/2021 ? ?Time Spent:  68mn ? ?Treatment Type: Individual Therapy ? ?Reported Symptoms: let down, upset, sad ? ?Mental Status Exam: ? ?Appearance:   Casual     ?Behavior:  Appropriate and Sharing  ?Motor:  Normal  ?Speech/Language:   Clear and Coherent and Normal Rate  ?Affect:  Appropriate and Congruent  ?Mood:  angry and sad   ?Thought process:  normal  ?Thought content:    Obsessions and Rumination  ?Sensory/Perceptual disturbances:    WNL  ?Orientation:  x4  ?Attention:  Good  ?Concentration:  Good  ?Memory:  WNL  ?Fund of knowledge:   Good  ?Insight:    Fair  ?Judgment:   Good  ?Impulse Control:  Good  ? ?Risk Assessment: ?Danger to Self:  No. Denied.  ?Self-injurious Behavior: No ?Danger to Others: No ?Duty to Warn:no ?Physical Aggression / Violence:No  ?Access to Firearms a concern: No  ?Gang Involvement:No  ? ? 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 Client feels like she never gets something she really wants/ is passionate about. Therapist used MI & grief therapy to support client as she processed her grief and let down for not getting in. Therapist also used CBT with client to help her challenge her negative perspective about her life all the time. Therapist assessed for stability, & client denied SI/HI/AVH. ? ?Interventions: Cognitive Behavioral Therapy, Motivational Interviewing, and RPT    ? ?Diagnosis: ?  ICD-10-CM   ?1. 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  ?2. Mild episode of recurrent major depressive disorder (HCC)  F33.0   ?  ? ? ? ?Plan of Care: ?Client to process her anxieties using bilateral music and a support person's presence.  ?Client to return for weekly therapy with KSammuel Cooper therapist, to review again in  6 months.  ?Client is to continue seeing medication provider for support of mood management.   ?Client to engage in CBT: challenging negative internal ruminations and self-talk AEB journaling daily or expressing thoughts to support persons in their life and then challenging it with truth.  ?Client to engage in tapping to tap in healthy cognition that challenges negative rumination or deep negative core belief.  ?Client to practice DBT distress tolerance skills to decrease crying spells and thoughts of not being able to endure their suffering AEB using mindfulness, deep breathing, and TIP to increase tolerance for discomfort, discharge emotional distress, and increase their understanding that they can do hard things.  ?Client to utilize BSP (brainspotting) with therapist to help client identify and process triggers for their depressive episode and anxiety with goal of reducing said SUDs caused by depression/anxiety by 33% in the next 6 months.  ?Client to prioritize sleep 8+ hours each week night AEB going to bed by 10pm each night.  ? ?KBarnie Del LCSW, LCAS, CCTP, CCS-I, BSP ?

## 2021-06-18 ENCOUNTER — Encounter (HOSPITAL_COMMUNITY): Payer: Self-pay

## 2021-06-22 ENCOUNTER — Ambulatory Visit: Payer: 59

## 2021-06-24 ENCOUNTER — Ambulatory Visit: Payer: 59 | Attending: Surgery | Admitting: Rehabilitation

## 2021-06-24 DIAGNOSIS — Z483 Aftercare following surgery for neoplasm: Secondary | ICD-10-CM | POA: Insufficient documentation

## 2021-06-24 DIAGNOSIS — C50412 Malignant neoplasm of upper-outer quadrant of left female breast: Secondary | ICD-10-CM | POA: Insufficient documentation

## 2021-06-24 DIAGNOSIS — M25612 Stiffness of left shoulder, not elsewhere classified: Secondary | ICD-10-CM | POA: Insufficient documentation

## 2021-06-24 DIAGNOSIS — Z170 Estrogen receptor positive status [ER+]: Secondary | ICD-10-CM | POA: Insufficient documentation

## 2021-06-24 NOTE — Therapy (Signed)
?OUTPATIENT PHYSICAL THERAPY [facility] SCREENING NOTE ? ? ?Patient Name: Michaela Roman ?MRN: [id number] ?DOB:1970/04/21, 51 y.o., female ?Today's Date: 06/24/2021 ? ?PCP: Glenis Smoker, MD ?REFERRING PROVIDER: Deanne Coffer* ? ? PT End of Session - 06/24/21 1059   ? ? Visit Number 10   screen only  ? Date for PT Re-Evaluation 06/11/20   ? PT Start Time [phone]   ? PT Stop Time 1000   ? PT Time Calculation (min) 8 min   ? Activity Tolerance Patient tolerated treatment well   ? Behavior During Therapy [facility] for tasks assessed/performed   ? ?  ?  ? ?  ? ? ?Past Medical History:  ?Diagnosis Date  ? Anxiety   ? Arthritis   ? OP  ? Breast cancer ([place])   ? Depression   ? Family history of breast cancer 02/13/2020  ? History of radiation therapy 04/17/20-06/03/20  ? IMRT to Left breast Dr. Gery Pray   ? Hormone disorder   ? Hypothyroidism   ? 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 Personal history of radiation therapy   ? Premature ovarian failure   ? Dx'd age 77  ? Thyroid disease   ? hypothyroid  ? ?Past Surgical History:  ?Procedure Laterality Date  ? BREAST BIOPSY Left 02/04/2020  ? breast and lymphnode  ? BREAST LUMPECTOMY Left 03/12/2020  ? BREAST LUMPECTOMY WITH RADIOACTIVE SEED AND SENTINEL LYMPH NODE BIOPSY Left 03/12/2020  ? Procedure: LEFT BREAST LUMPECTOMY WITH RADIOACTIVE SEED AND LEFT TARGETED RADIOACTIVE SEED LYMPH NODE BIOPSY AND LEFT SENTINEL LYMPH NODE MAPPING;  Surgeon: Erroll Luna, MD;  Location: [facility];  Service: General;  Laterality: Left;  ? DILATATION & CURETTAGE/HYSTEROSCOPY WITH MYOSURE N/A 10/07/2015  ? Procedure: DILATATION & CURETTAGE/HYSTEROSCOPY WITH MYOSURE;  Surgeon: Nunzio Cobbs, MD;  Location: [facility];  Service: Gynecology;  Laterality: N/A;  endometrial polyp  ? SCAR REVISION Left 02/05/2021  ? Procedure: left breast SCAR REVISION;  Surgeon: Erroll Luna, MD;  Location: [facility];  Service: General;  Laterality: Left;  61  ? WISDOM TOOTH  EXTRACTION    ? ?Patient Active Problem List  ? Diagnosis Date Noted  ? 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 Genetic testing 02/21/2020  ? Family history of prostate cancer 02/13/2020  ? Family history of breast cancer 02/13/2020  ? Family history of colon cancer 02/13/2020  ? Malignant neoplasm of upper-outer quadrant of left breast in female, estrogen receptor positive ([place]) 02/06/2020  ? ? ?REFERRING DIAG: left breast cancer at risk for lymphedema ? ?THERAPY DIAG:  ?Aftercare following surgery for neoplasm ? ?Stiffness of left shoulder, not elsewhere classified ? ?Malignant neoplasm of upper-outer quadrant of left breast in female, estrogen receptor positive ([place]) ? ?PERTINENT HISTORY:  ? Patient was diagnosed on 01/23/2020 with left 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 Patient underwent a left lumpectomy and sentinel node biopsy (1/4 positive) on 03/12/2020. It is ER/PR positive and HER2 negative with a Ki67 of 30%.   ? ? ?PRECAUTIONS: left UE Lymphedema risk,  ? ?SUBJECTIVE: here for 3 month SOZO ? ?PAIN:  ?Are you having pain? No ? ?SOZO SCREENING: ?Patient was assessed today using the SOZO machine to determine the lymphedema index score. This was compared to her baseline score. It was determined that she is within the recommended range when compared to her baseline and no further action is needed at this time. She will continue  SOZO screenings. These are done every 3 months for 2 years post operatively followed by every 6 months for 2 years, and then annually. ? ?PLAN: continue 3 month SOZO until at least 03/12/22 ? ?Stark Bray, PT ?06/24/2021, 11:00 AM ? ?  ? ?

## 2021-07-08 ENCOUNTER — Other Ambulatory Visit: Payer: Self-pay | Admitting: Behavioral Health

## 2021-07-08 DIAGNOSIS — F411 Generalized anxiety disorder: Secondary | ICD-10-CM

## 2021-07-08 DIAGNOSIS — F4323 Adjustment disorder with mixed anxiety and depressed mood: Secondary | ICD-10-CM

## 2021-07-08 DIAGNOSIS — F331 Major depressive disorder, recurrent, moderate: Secondary | ICD-10-CM

## 2021-07-08 DIAGNOSIS — F330 Major depressive disorder, recurrent, mild: Secondary | ICD-10-CM

## 2021-07-08 DIAGNOSIS — F4311 Post-traumatic stress disorder, acute: Secondary | ICD-10-CM

## 2021-07-14 ENCOUNTER — Ambulatory Visit (INDEPENDENT_AMBULATORY_CARE_PROVIDER_SITE_OTHER): Payer: 59 | Admitting: Behavioral Health

## 2021-07-14 ENCOUNTER — Encounter: Payer: Self-pay | Admitting: Behavioral Health

## 2021-07-14 ENCOUNTER — Ambulatory Visit (INDEPENDENT_AMBULATORY_CARE_PROVIDER_SITE_OTHER): Payer: 59 | Admitting: Addiction (Substance Use Disorder)

## 2021-07-14 DIAGNOSIS — F411 Generalized anxiety disorder: Secondary | ICD-10-CM

## 2021-07-14 DIAGNOSIS — F331 Major depressive disorder, recurrent, moderate: Secondary | ICD-10-CM

## 2021-07-14 DIAGNOSIS — F4311 Post-traumatic stress disorder, acute: Secondary | ICD-10-CM

## 2021-07-14 DIAGNOSIS — F4323 Adjustment disorder with mixed anxiety and depressed mood: Secondary | ICD-10-CM | POA: Diagnosis not present

## 2021-07-14 DIAGNOSIS — F330 Major depressive disorder, recurrent, mild: Secondary | ICD-10-CM

## 2021-07-14 MED ORDER — VILAZODONE HCL 20 MG PO TABS: 1.0000 | ORAL_TABLET | Freq: Every day | ORAL | 2 refills | Status: DC

## 2021-07-14 NOTE — Progress Notes (Signed)
[facility] ? ?Patient ID: Michaela Roman,  ?MRN: [id number] ? ?PCP: Glenis Smoker, MD ? ?Date of Evaluation: 07/14/2021 ?Time spent:20 minutes ? ?Chief Complaint:  ?Chief Complaint   ?Medication Refill; Anxiety; Depression; Follow-up ?  ? ? ?HISTORY/CURRENT STATUS: ?HPI ? ?51 year old female presents to this office for follow up and medication management. She continue to report Viibryd is working well for anxiety and depression. She says this has been a very good period for her. She is getting ready to be much busier with work over the summer. 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 She would like to discuss weaning off AD in the following months.  She is getting about 7-8 hours of sleep per night.  No mania, no psychosis. No SI/HI. Continues with Sammuel Cooper for therapy.  ?  ?  ?Wellbutrin- Insomnia, racing thoughts Stopped after 2 weeks. ?  ?Effexor-pt said was not controlling depression ?Zoloft- Took for a few weeks. Had insomnia. Developed hives.  ?Lexapro ?Lorazepam- Had a calming effect. Not effective with decreasing middle of the night awakenings. ?Melatonin- Vivid dreams ?Trazodone- Felt sedated but did not sleep.  ?Lunesta '1mg'$ - said was not effective ?  ?  ? ? ? ? ?Individual Medical History/ Review of Systems: Changes? :No  ? ?Allergies: Sertraline ? ?Current Medications:  ?Current Outpatient Medications:  ?  calcium-vitamin D (OSCAL WITH D) 500-200 MG-UNIT tablet, Take 1 tablet by mouth., Disp: , Rfl:  ?  gabapentin (NEURONTIN) 300 MG capsule, Take 300 mg by mouth 3 (three) times daily., Disp: , Rfl:  ?  levothyroxine (SYNTHROID) 112 MCG tablet, Take 1 tablet by mouth daily before breakfast., Disp: 90 tablet, Rfl: 0 ?  Multiple Vitamins-Minerals (MULTIVITAMIN PO), Take 1 tablet by mouth daily., Disp: , Rfl:  ?  tamoxifen (NOLVADEX) 20 MG tablet, Take 1 tablet (20 mg total) by mouth daily., Disp: 90 tablet, Rfl: 3 ?  Vilazodone HCl 20 MG TABS, Take 1 tablet (20 mg total) by mouth  daily., Disp: 90 tablet, Rfl: 2 ?Medication Side Effects: none ? ?Family Medical/ Social History: Changes? No ? ?MENTAL HEALTH EXAM: ? ?Last menstrual period 08/07/2015.There is no height or weight on file to calculate BMI.  ?General Appearance: Casual, Neat, and Well Groomed  ?Eye Contact:  Good  ?Speech:  Clear and Coherent  ?Volume:  Normal  ?Mood:  NA  ?Affect:  Appropriate  ?Thought Process:  Coherent  ?Orientation:  Full (Time, Place, and Person)  ?Thought Content: Logical   ?Suicidal Thoughts:  No  ?Homicidal Thoughts:  No  ?Memory:  WNL  ?Judgement:  Good  ?Insight:  Good  ?Psychomotor Activity:  Normal  ?Concentration:  Concentration: Good  ?Recall:  Good  ?Fund of Knowledge: Good  ?Language: Good  ?Assets:  Desire for Improvement  ?ADL's:  Intact  ?Cognition: WNL  ?Prognosis:  Good  ? ? ?DIAGNOSES:  ?  ICD-10-CM   ?1. GAD (generalized anxiety disorder)  F41.1 Vilazodone HCl 20 MG TABS  ?  ?2. Acute posttraumatic stress disorder  F43.11 Vilazodone HCl 20 MG TABS  ?  ?3. Mild episode of recurrent major depressive disorder (HCC)  F33.0 Vilazodone HCl 20 MG TABS  ?  ?4. Adjustment disorder with mixed anxiety and depressed mood  F43.23 Vilazodone HCl 20 MG TABS  ?  ?5. Moderate recurrent major depression (HCC)  F33.1 Vilazodone HCl 20 MG TABS  ?  ? ? ?Receiving Psychotherapy: Yes  ? ? ?RECOMMENDATIONS:  ? ?Greater than 50% of 20 min  face to face time with patient was spent on counseling and coordination of care.  We discussed her recent improvement with anxiety and depression. She is reporting feeling very stable right now. She wants to discuss weaning off the medication next visit.  ?Continue Viibryd 20 mg daily ?She will reinitiate Lunesta 2 mg  at bedtime for sleep ?To report and side effects or worsening symptoms ?Provided emergency contact information ?Reviewed PDMP  ? ? ? ?Michaela Brooklyn, NP  ?

## 2021-07-14 NOTE — Progress Notes (Signed)
?      [facility] Counselor/Therapist Progress Note ? ?Patient ID: Michaela Roman, MRN: [id number],   ? ?Date: 07/14/2021 ? ?Time Spent:  32mns ? ?Treatment Type: Individual Therapy ? ?Reported Symptoms: anxious ? ?Mental Status Exam: ? ?Appearance:   Casual     ?Behavior:  Appropriate and Sharing  ?Motor:  Normal  ?Speech/Language:   Clear and Coherent and Normal Rate  ?Affect:  Appropriate and Congruent  ?Mood:  anxious and labile   ?Thought process:  normal  ?Thought content:    Obsessions and Rumination  ?Sensory/Perceptual disturbances:    WNL  ?Orientation:  x4  ?Attention:  Good  ?Concentration:  Good  ?Memory:  WNL  ?Fund of knowledge:   Good  ?Insight:    Fair  ?Judgment:   Fair  ?Impulse Control:  Good  ? ?Risk Assessment: ?Danger to Self:  No. Denied.  ?Self-injurious Behavior: No ?Danger to Others: No ?Duty to Warn:no ?Physical Aggression / Violence:No  ?Access to Firearms a concern: No  ?Gang Involvement:No  ? ? ?Subjective: Client processed her anxiety of growing complacent in her life. Client processed feeling pity on her mom and grandma who she feels dont have a full life or thinks they dont have anything to look forward to as they age. Client struggles with the anxiety of facing her mortality and feels like our worth is tied to our life accomplishments. Therapist used MI & CBT to challenge that thinking and to help client try to investigate other places she can look to see her intrinsic worth and find ways to get comfortable with the idea of mortality. Therapist also encouraged client to continue looking for things in her life she is motivated about, so that she can stay growing/changing and not be afraid she will grow complacent. Therapist assessed for stability, & client denied SI/HI/AVH. ? ?Interventions: Cognitive Behavioral Therapy, Motivational Interviewing, and RPT    ? ?Diagnosis: ?  ICD-10-CM   ?1. GAD (generalized anxiety disorder)  F41.1   ?  ? ? ?Plan of Care: ?Client to process  her anxieties using bilateral music and a support person's presence.  ?Client to return for weekly therapy with KSammuel Cooper therapist, to review again in 6 months.  ?Client is to continue seeing medication provider for support of mood management.   ?Client to engage in tapping to tap in healthy cognition that challenges negative rumination or deep negative core belief.  ?Client to practice DBT distress tolerance skills to decrease crying spells and thoughts of not being able to endure their suffering AEB using mindfulness, deep breathing, and TIP to increase tolerance for discomfort, discharge emotional distress, and increase their understanding that they can do hard things.  ?Client to utilize BSP (brainspotting) with therapist to help client identify and process triggers for their depressive episode and anxiety with goal of reducing said SUDs caused by depression/anxiety by 33% in the next 6 months.  ?Client to engage in CBT: challenging negative internal ruminations and self-talk AEB journaling daily or expressing thoughts to support persons in their life and then challenging it with truth, and also trying new things and focusing on intrinsic worth.  ?Client to prioritize sleep 8+ hours each week night AEB going to bed by 10pm each night.  ? ?KBarnie Del LCSW, LCAS, CCTP, CCS-I, BSP ?

## 2021-07-28 ENCOUNTER — Ambulatory Visit: Payer: 59 | Admitting: Addiction (Substance Use Disorder)

## 2021-07-29 ENCOUNTER — Ambulatory Visit (INDEPENDENT_AMBULATORY_CARE_PROVIDER_SITE_OTHER): Payer: 59 | Admitting: Addiction (Substance Use Disorder)

## 2021-07-29 DIAGNOSIS — F411 Generalized anxiety disorder: Secondary | ICD-10-CM

## 2021-07-29 NOTE — Progress Notes (Signed)
      [facility] Counselor/Therapist Progress Note  Patient ID: Michaela Roman, MRN: [id number],    Date: 07/29/2021  Time Spent:  56mns  Treatment Type: Individual Therapy  Reported Symptoms: feeling the need to rest, motivated, frustrated  Mental Status Exam:  Appearance:   Casual     Behavior:  Appropriate and Sharing  Motor:  Normal  Speech/Language:   Clear and Coherent and Normal Rate  Affect:  Appropriate and Congruent  Mood:  anxious, irritable, and labile   Thought process:  normal  Thought content:    Rumination  Sensory/Perceptual disturbances:    WNL  Orientation:  x4  Attention:  Good  Concentration:  Good  Memory:  WNL  Fund of knowledge:   Good  Insight:    Fair  Judgment:   Fair  Impulse Control:  Good   Risk Assessment: Danger to Self:  No. Denied.  Self-injurious Behavior: No Danger to Others: No Duty to Warn:no Physical Aggression / Violence:No  Access to Firearms a concern: No  Gang Involvement:No    Subjective: Client processed her need for weekly therapy and her frustration with one of her appointments being canceled. Client reported her frustration and desire to find another therapist who can offer the same weekly appointments. Therapist encouraged client to find another counselor and offered some options. Client also processed her need for additional time to rest throughout the week and her struggle with allowing herself to rest without guilt. Therapist used CBT to help her challenge irrational thoughts of guilt. Client feeling motivated to write and reports making progress in that and also in her daily yoga practice. Therapist used MI to affirm her growth. Therapist assessed for stability, & client denied SI/HI/AVH.  Interventions: Cognitive Behavioral Therapy, Motivational Interviewing, and RPT     Diagnosis:   ICD-10-CM   1. GAD (generalized anxiety disorder)  F41.1      Plan of Care: Client to process her anxieties using  bilateral music and a support person's presence.  Client to return for weekly therapy with KSammuel Cooper therapist, to review again in 6 months.  Client is to continue seeing medication provider for support of mood management.   Client to engage in tapping to tap in healthy cognition that challenges negative rumination or deep negative core belief.  Client to practice DBT distress tolerance skills to decrease short temper and crying spells and thoughts of not being able to endure their suffering AEB using mindfulness, deep breathing, and TIP to increase tolerance for discomfort, discharge emotional distress, and increase their understanding that they can do hard things.  Client to utilize BSP (brainspotting) with therapist to help client identify and process triggers for their depressive episode and anxiety with goal of reducing said SUDs caused by depression/anxiety by 33% in the next 6 months.  Client to engage in CBT: challenging negative internal ruminations and self-talk AEB journaling daily or expressing thoughts to support persons in their life and then challenging it with truth, and also trying new things and focusing on intrinsic worth.  Progress: Client not increasing distress tolerance to deal more with her inner frustration, due to her lack of working on that bec of her lack of awareness/ acceptance of her unrealistic expectations for weekly therapy every single week.  KBarnie Del LCSW, LCAS, CCTP, CCS-I, BSP

## 2021-08-13 ENCOUNTER — Ambulatory Visit: Payer: 59 | Admitting: Addiction (Substance Use Disorder)

## 2021-08-20 ENCOUNTER — Encounter: Payer: Self-pay | Admitting: Obstetrics and Gynecology

## 2021-08-20 NOTE — Telephone Encounter (Signed)
Pending for Dr.Silva to sign order to be faxed.

## 2021-08-25 ENCOUNTER — Ambulatory Visit (INDEPENDENT_AMBULATORY_CARE_PROVIDER_SITE_OTHER): Payer: 59 | Admitting: Addiction (Substance Use Disorder)

## 2021-08-25 DIAGNOSIS — F4311 Post-traumatic stress disorder, acute: Secondary | ICD-10-CM

## 2021-08-25 DIAGNOSIS — F411 Generalized anxiety disorder: Secondary | ICD-10-CM | POA: Diagnosis not present

## 2021-08-25 NOTE — Progress Notes (Signed)
      [facility] Counselor/Therapist Progress Note  Patient ID: Michaela Roman, MRN: [id number],    Date: 08/25/2021  Time Spent:   Treatment Type: Individual Therapy  Reported Symptoms: anxious, traumatized, insomnia.   Mental Status Exam:  Appearance:   Casual     Behavior:  Appropriate and Sharing  Motor:  Normal  Speech/Language:   Clear and Coherent and Normal Rate  Affect:  Appropriate and Congruent  Mood:  anxious   Thought process:  normal  Thought content:    Rumination  Sensory/Perceptual disturbances:    WNL  Orientation:  x4  Attention:  Roman  Concentration:  Roman  Memory:  WNL  Fund of knowledge:   Roman  Insight:    Roman  Judgment:   Fair  Impulse Control:  Roman   Risk Assessment: Danger to Self:  No. Denied.  Self-injurious Behavior: No Danger to Others: No Duty to Warn:no Physical Aggression / Violence:No  Access to Firearms a concern: No  Gang Involvement:No    Subjective: Client reported her trigger of a year follow-up diagnostic mammogram following just over a year since she finished breast cancer treatment. Therapist used MI & CBT to validate her anxiety and nerves about this test coming up, and to help support her as she processes her fears, emotions and thoughts. Therapist also used psycho-education about trauma responses and body memories of trauma that we continue to have after the trauma resolves/ends. Client also reported more disturbed sleep along with the anxiety. Client has created as Roman of a sleep hygiene as she can, but is still having some increased insomnia and nightmares recently. Therapist also terminated with client and wrapped their goals up together since client sought out another practice that could offer her more frequent therapy. Therapist assessed for stability, & client denied SI/HI/AVH.  Interventions: Cognitive Behavioral Therapy, Motivational Interviewing, Psycho-education/Bibliotherapy, and RPT      Diagnosis:   ICD-10-CM   1. GAD (generalized anxiety disorder)  F41.1     2. Acute posttraumatic stress disorder  F43.11        Plan of Care: Client to process her anxieties using bilateral music and a support person's presence.  Client to return for weekly therapy with Zoila Shutter, therapist, to review again in 6 months.  Client is to continue seeing medication provider for support of mood management.   Client to engage in tapping to tap in healthy cognition that challenges negative rumination or deep negative core belief.  Client to practice DBT distress tolerance skills to decrease short temper and crying spells and thoughts of not being able to endure their suffering AEB using mindfulness, deep breathing, and TIP to increase tolerance for discomfort, discharge emotional distress, and increase their understanding that they can do hard things.  Client to utilize BSP (brainspotting) with therapist to help client identify and process triggers for their depressive episode and anxiety with goal of reducing said SUDs caused by depression/anxiety by 33% in the next 6 months.  Client to engage in CBT: challenging negative internal ruminations and self-talk AEB journaling daily or expressing thoughts to support persons in their life and then challenging it with truth, and also trying new things and focusing on intrinsic worth.  Progress: Client having recurring anxiety due to year milestone of breast cancer mammogram, but is continuing to make progress with emotional regulation.   Michaela Good, LCSW, LCAS, CCTP, CCS-I, BSP

## 2021-09-08 ENCOUNTER — Ambulatory Visit: Payer: 59 | Admitting: Addiction (Substance Use Disorder)

## 2021-09-11 ENCOUNTER — Encounter: Payer: Self-pay | Admitting: Obstetrics and Gynecology

## 2021-09-22 ENCOUNTER — Ambulatory Visit: Payer: 59 | Admitting: Addiction (Substance Use Disorder)

## 2021-09-25 NOTE — Progress Notes (Signed)
Patient Care Team: Glenis Smoker, MD as PCP - General (Family Medicine) Mauro Kaufmann, RN as Oncology Nurse Navigator Carlynn Spry, Charlott Holler, RN as Oncology Nurse Navigator Erroll Luna, MD as Consulting Physician (General Surgery) Magrinat, Virgie Dad, MD (Inactive) as Consulting Physician (Oncology) Gery Pray, MD as Consulting Physician (Radiation Oncology) Nunzio Cobbs, MD as Consulting Physician (Obstetrics and Gynecology)  DIAGNOSIS: No diagnosis found.  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  Variant of uncertain significance detected in CDK4 at p.P302L.  No other uncertain or pathogenic variants detected in other genes. The report date is February 26, 2020.    The CancerNext-Expanded gene panel offered by [facility] and includes sequencing, rearrangement, and RNA analysis for the following 77 genes: AIP, ALK, APC, ATM, AXIN2, BAP1, BARD1, BLM, BMPR1A, BRCA1, BRCA2, BRIP1, CDC73, CDH1, CDK4, CDKN1B, CDKN2A, CHEK2, CTNNA1, DICER1, FANCC, FH, FLCN, GALNT12, KIF1B, LZTR1, MAX, MEN1, MET, MLH1, MSH2, MSH3, MSH6, MUTYH, NBN, NF1, NF2, NTHL1, PALB2, PHOX2B, PMS2, POT1, PRKAR1A, PTCH1, PTEN, RAD51C, RAD51D, RB1, RECQL, RET, SDHA, SDHAF2, SDHB, SDHC, SDHD, SMAD4, SMARCA4, SMARCB1, SMARCE1, STK11, SUFU, TMEM127, TP53, TSC1, TSC2, VHL and XRCC2 (sequencing and deletion/duplication); EGFR, EGLN1, HOXB13, KIT, MITF, PDGFRA, POLD1, and POLE (sequencing only); EPCAM and GREM1 (deletion/duplication only).      CHIEF COMPLIANT: Estrogen receptor positive breast cancer currently on tamoxifen    INTERVAL  HISTORY: Michaela Roman is a 51 y.o with the above mention. Estrogen receptor positive breast cancer currently on tamoxifen *** presents to the clinic today for a follow-up    ALLERGIES:  is allergic to sertraline.  MEDICATIONS:  Current Outpatient Medications  Medication Sig Dispense Refill   calcium-vitamin D (OSCAL WITH D) 500-200 MG-UNIT tablet Take 1 tablet by mouth.     gabapentin (NEURONTIN) 300 MG capsule Take 300 mg by mouth 3 (three) times daily.     levothyroxine (SYNTHROID) 112 MCG tablet Take 1 tablet by mouth daily before breakfast. 90 tablet 0   Multiple Vitamins-Minerals (MULTIVITAMIN PO) Take 1 tablet by mouth daily.     tamoxifen (NOLVADEX) 20 MG tablet Take 1 tablet (20 mg total) by mouth daily. 90 tablet 3   Vilazodone HCl 20 MG TABS Take 1 tablet (20 mg total) by mouth daily. 90 tablet 2   No current facility-administered medications for this visit.    PHYSICAL EXAMINATION: ECOG PERFORMANCE STATUS: {CHL ONC ECOG PS:[id number]}  There were no vitals filed for this visit. There were no vitals filed for this visit.  BREAST:*** No palpable masses or nodules in either right or left breasts. No palpable axillary supraclavicular or infraclavicular adenopathy no breast tenderness or nipple discharge. (exam performed in the presence of a chaperone)  LABORATORY DATA:  I have reviewed the data as listed    Latest Ref Rng & Units 03/07/2020   10:50 AM 02/13/2020    8:21 AM 10/17/2018    9:22 AM  CMP  Glucose 70 - 99 mg/dL 99  105  81   BUN 6 - 20 mg/dL _0 Creatinine 0.44 - 1.00 mg/dL 0.91  1.03  0.97   Sodium 135 - 145 mmol/L 141  143  140   Potassium 3.5 - 5.1 mmol/L 4.4  3.7  4.2   Chloride 98 - 111 mmol/L 102  106  103   CO2 22 - 32 mmol/L _0 Calcium 8.9 - 10.3 mg/dL 9.3  9.4  8.9   Total Protein 6.5 - 8.1 g/dL 6.8  7.3  6.5   Total Bilirubin 0.3 - 1.2 mg/dL 0.6  0.8  0.7   Alkaline Phos 38 - 126 U/L 41  46  41   AST 15 - 41 U/L _1 ALT 0 - 44 U/L _2 Lab Results  Component Value Date   WBC 5.3 03/07/2020   HGB 14.2 03/07/2020   HCT 43.3 03/07/2020   MCV 93.7 03/07/2020   PLT 183 03/07/2020   NEUTROABS 3.2 03/07/2020    ASSESSMENT & PLAN:  No problem-specific Assessment & Plan notes found for this encounter.    No orders of the defined types were placed in this encounter.  The patient has a good understanding of the overall plan. she agrees with it. she will call with any problems that may develop before the next visit here. Total time spent: 30 mins including face to face time and time spent for planning, charting and co-ordination of care   Suzzette Righter, [place] 09/25/21    I Gardiner Coins am scribing for Dr. Lindi Adie  ***

## 2021-09-29 ENCOUNTER — Other Ambulatory Visit: Payer: Self-pay | Admitting: *Deleted

## 2021-09-29 DIAGNOSIS — C50412 Malignant neoplasm of upper-outer quadrant of left female breast: Secondary | ICD-10-CM

## 2021-10-01 ENCOUNTER — Other Ambulatory Visit: Payer: Self-pay

## 2021-10-01 ENCOUNTER — Inpatient Hospital Stay: Payer: 59 | Attending: Hematology and Oncology

## 2021-10-01 ENCOUNTER — Inpatient Hospital Stay (HOSPITAL_BASED_OUTPATIENT_CLINIC_OR_DEPARTMENT_OTHER): Payer: 59 | Admitting: Hematology and Oncology

## 2021-10-01 DIAGNOSIS — Z853 Personal history of malignant neoplasm of breast: Secondary | ICD-10-CM | POA: Insufficient documentation

## 2021-10-01 DIAGNOSIS — M85852 Other specified disorders of bone density and structure, left thigh: Secondary | ICD-10-CM | POA: Diagnosis not present

## 2021-10-01 DIAGNOSIS — Z170 Estrogen receptor positive status [ER+]: Secondary | ICD-10-CM | POA: Diagnosis not present

## 2021-10-01 DIAGNOSIS — C50412 Malignant neoplasm of upper-outer quadrant of left female breast: Secondary | ICD-10-CM

## 2021-10-01 DIAGNOSIS — M8580 Other specified disorders of bone density and structure, unspecified site: Secondary | ICD-10-CM | POA: Diagnosis present

## 2021-10-01 DIAGNOSIS — Z79810 Long term (current) use of selective estrogen receptor modulators (SERMs): Secondary | ICD-10-CM | POA: Insufficient documentation

## 2021-10-01 LAB — CMP (CANCER CENTER ONLY)
ALT: 14 U/L (ref 0–44)
AST: 18 U/L (ref 15–41)
Albumin: 4.3 g/dL (ref 3.5–5.0)
Alkaline Phosphatase: 38 U/L (ref 38–126)
Anion gap: 5 (ref 5–15)
BUN: 15 mg/dL (ref 6–20)
CO2: 29 mmol/L (ref 22–32)
Calcium: 8.7 mg/dL — ABNORMAL LOW (ref 8.9–10.3)
Chloride: 106 mmol/L (ref 98–111)
Creatinine: 1.05 mg/dL — ABNORMAL HIGH (ref 0.44–1.00)
GFR, Estimated: >60 mL/min (ref >60)
Glucose, Bld: 101 mg/dL — ABNORMAL HIGH (ref 70–99)
Potassium: 3.9 mmol/L (ref 3.5–5.1)
Sodium: 140 mmol/L (ref 135–145)
Total Bilirubin: 0.4 mg/dL (ref 0.3–1.2)
Total Protein: 6.7 g/dL (ref 6.5–8.1)

## 2021-10-01 LAB — CBC WITH DIFFERENTIAL (CANCER CENTER ONLY)
Abs Immature Granulocytes: 0.01 10*3/uL (ref 0.00–0.07)
Basophils Absolute: 0 10*3/uL (ref 0.0–0.1)
Basophils Relative: 1 %
Eosinophils Absolute: 0.1 10*3/uL (ref 0.0–0.5)
Eosinophils Relative: 1 %
HCT: 35.7 % — ABNORMAL LOW (ref 36.0–46.0)
Hemoglobin: 12.5 g/dL (ref 12.0–15.0)
Immature Granulocytes: 0 %
Lymphocytes Relative: 32 %
Lymphs Abs: 1.4 10*3/uL (ref 0.7–4.0)
MCH: 31.3 pg (ref 26.0–34.0)
MCHC: 35 g/dL (ref 30.0–36.0)
MCV: 89.5 fL (ref 80.0–100.0)
Monocytes Absolute: 0.4 10*3/uL (ref 0.1–1.0)
Monocytes Relative: 8 %
Neutro Abs: 2.6 10*3/uL (ref 1.7–7.7)
Neutrophils Relative %: 58 %
Platelet Count: 163 10*3/uL (ref 150–400)
RBC: 3.99 MIL/uL (ref 3.87–5.11)
RDW: 12.2 % (ref 11.5–15.5)
WBC Count: 4.5 10*3/uL (ref 4.0–10.5)
nRBC: 0 % (ref 0.0–0.2)

## 2021-10-01 NOTE — Assessment & Plan Note (Addendum)
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  This is primarily associated with increased risk of prostate cancer 06/03/2020: Completed adjuvant radiation  Current treatment: Tamoxifen started May 2022 Tamoxifen toxicities: Insomnia and hot flashes have improved significantly.  Slight muscle stiffness in the arms.  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  Breast cancer surveillance: 1.  Breast exam 10/01/2021: Benign 2. Mammogram 09/11/2021 at [facility]: Benign  Bone density 06/03/2021: T score -1.5: Osteopenia: Recommend calcium vitamin D and weightbearing exercises  Return to clinic in 1 year for follow-up

## 2021-10-06 ENCOUNTER — Ambulatory Visit: Payer: 59 | Admitting: Addiction (Substance Use Disorder)

## 2021-10-13 ENCOUNTER — Ambulatory Visit: Payer: 59 | Attending: Surgery | Admitting: Rehabilitation

## 2021-10-13 ENCOUNTER — Encounter: Payer: Self-pay | Admitting: Rehabilitation

## 2021-10-13 DIAGNOSIS — M25612 Stiffness of left shoulder, not elsewhere classified: Secondary | ICD-10-CM | POA: Insufficient documentation

## 2021-10-13 DIAGNOSIS — Z483 Aftercare following surgery for neoplasm: Secondary | ICD-10-CM | POA: Insufficient documentation

## 2021-10-13 DIAGNOSIS — Z170 Estrogen receptor positive status [ER+]: Secondary | ICD-10-CM | POA: Insufficient documentation

## 2021-10-13 DIAGNOSIS — C50412 Malignant neoplasm of upper-outer quadrant of left female breast: Secondary | ICD-10-CM | POA: Insufficient documentation

## 2021-10-13 NOTE — Therapy (Signed)
OUTPATIENT PHYSICAL THERAPY SOZO SCREENING NOTE   Patient Name: Michaela Roman MRN: [id number] DOB:October 06, 1970, 51 y.o., female Today's Date: 10/13/2021  PCP: Glenis Smoker, MD REFERRING PROVIDER: Erroll Luna, MD   PT End of Session - 10/13/21 1558     Visit Number 10   screen only   PT Start Time 0350    PT Stop Time 0356    PT Time Calculation (min) 6 min    Activity Tolerance Patient tolerated treatment well    Behavior During Therapy [facility] for tasks assessed/performed              Past Medical History:  Diagnosis Date   Anxiety    Arthritis    OP   Breast cancer ([place])    Depression    Family history of breast cancer 02/13/2020   History of radiation therapy 04/17/20-06/03/20   IMRT to Left breast Dr. 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   Personal history of radiation therapy    Premature ovarian failure    Dx'd age 28   Thyroid disease    hypothyroid   Past Surgical History:  Procedure Laterality Date   BREAST BIOPSY Left 02/04/2020   breast and lymphnode   BREAST LUMPECTOMY Left 03/12/2020   BREAST LUMPECTOMY WITH RADIOACTIVE SEED AND SENTINEL LYMPH NODE BIOPSY Left 03/12/2020   Procedure: LEFT BREAST LUMPECTOMY WITH RADIOACTIVE SEED AND LEFT TARGETED RADIOACTIVE SEED LYMPH NODE BIOPSY AND LEFT SENTINEL LYMPH NODE [place];  Surgeon: Erroll Luna, MD;  Location: [facility];  Service: General;  Laterality: Left;   [place] N/A 10/07/2015   Procedure: DILATATION & CURETTAGE/HYSTEROSCOPY WITH MYOSURE;  Surgeon: Nunzio Cobbs, MD;  Location: [facility];  Service: Gynecology;  Laterality: N/A;  endometrial polyp   SCAR REVISION Left 02/05/2021   Procedure: left breast SCAR REVISION;  Surgeon: Erroll Luna, MD;  Location: [facility];  Service: General;  Laterality: Left;  [place] EXTRACTION     Patient Active Problem List    Diagnosis Date Noted   Bilateral carpal tunnel syndrome 07/22/2020   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   Genetic testing 02/21/2020   Family history of prostate cancer 02/13/2020   Family history of breast cancer 02/13/2020   Family history of colon cancer 02/13/2020   Malignant neoplasm of upper-outer quadrant of left breast in female, estrogen receptor positive ([place]) 02/06/2020    REFERRING DIAG: left breast cancer at risk for lymphedema  THERAPY DIAG:  Aftercare following surgery for neoplasm  Stiffness of left shoulder, not elsewhere classified  Malignant neoplasm of upper-outer quadrant of left breast in female, estrogen receptor positive ([place])  PERTINENT HISTORY:   Patient was diagnosed on 01/23/2020 with left 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 Patient underwent a left lumpectomy and sentinel node biopsy (1/4 positive) on 03/12/2020. It is ER/PR positive and HER2 negative with a Ki67 of 30%.     PRECAUTIONS: left UE Lymphedema risk,   SUBJECTIVE: here for 3 month SOZO  PAIN:  Are you having pain? No  SOZO SCREENING: Patient was assessed today using the SOZO machine to determine the lymphedema index score. This was compared to her baseline score. It was determined that she is within the recommended range when compared to her baseline and no further action is needed at this time. She will continue SOZO screenings. These are done every 3  months for 2 years post operatively followed by every 6 months for 2 years, and then annually.  PLAN: continue 3 month SOZO until at least 03/12/22  Stark Bray, PT 10/13/2021, 3:59 PM

## 2021-10-14 ENCOUNTER — Ambulatory Visit (INDEPENDENT_AMBULATORY_CARE_PROVIDER_SITE_OTHER): Payer: 59 | Admitting: Behavioral Health

## 2021-10-14 ENCOUNTER — Encounter: Payer: Self-pay | Admitting: Behavioral Health

## 2021-10-14 DIAGNOSIS — F330 Major depressive disorder, recurrent, mild: Secondary | ICD-10-CM | POA: Diagnosis not present

## 2021-10-14 DIAGNOSIS — F4323 Adjustment disorder with mixed anxiety and depressed mood: Secondary | ICD-10-CM

## 2021-10-14 DIAGNOSIS — F4311 Post-traumatic stress disorder, acute: Secondary | ICD-10-CM | POA: Diagnosis not present

## 2021-10-14 DIAGNOSIS — F411 Generalized anxiety disorder: Secondary | ICD-10-CM

## 2021-10-14 DIAGNOSIS — F331 Major depressive disorder, recurrent, moderate: Secondary | ICD-10-CM

## 2021-10-14 MED ORDER — VILAZODONE HCL 20 MG PO TABS: 1.0000 | ORAL_TABLET | Freq: Every day | ORAL | 4 refills | Status: DC

## 2021-10-14 NOTE — Progress Notes (Signed)
[facility]  Patient ID: Michaela Roman,  MRN: [id number]  PCP: Glenis Smoker, MD  Date of Evaluation: 10/14/2021 Time spent:20 minutes  Chief Complaint:  Chief Complaint   Anxiety; Depression; Follow-up; Medication Refill     HISTORY/CURRENT STATUS: HPI  51 year old female presents to this office for follow up and medication management. She continues to report Viibryd is working well for anxiety and depression. She says this has been a very good period for her. She is getting ready to be much busier with work over the summer but has decided she would like to try to wean off the medication in January, 2024. 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 She would like to discuss weaning off AD in the following months.  She is getting about 7-8 hours of sleep per night.  No mania, no psychosis. No SI/HI. Continues with Sammuel Cooper for therapy.   Wellbutrin- Insomnia, racing thoughts Stopped after 2 weeks.   Effexor-pt said was not controlling depression Zoloft- Took for a few weeks. Had insomnia. Developed hives.  Lexapro Lorazepam- Had a calming effect. Not effective with decreasing middle of the night awakenings. Melatonin- Vivid dreams Trazodone- Felt sedated but did not sleep.  Lunesta '1mg'$ - said was not effective    Individual Medical History/ Review of Systems: Changes? :No   Allergies: Sertraline  Current Medications:  Current Outpatient Medications:    calcium-vitamin D (OSCAL WITH D) 500-200 MG-UNIT tablet, Take 1 tablet by mouth., Disp: , Rfl:    gabapentin (NEURONTIN) 300 MG capsule, Take 300 mg by mouth 3 (three) times daily., Disp: , Rfl:    levothyroxine (SYNTHROID) 112 MCG tablet, Take 1 tablet by mouth daily before breakfast., Disp: 90 tablet, Rfl: 0   Multiple Vitamins-Minerals (MULTIVITAMIN PO), Take 1 tablet by mouth daily., Disp: , Rfl:    tamoxifen (NOLVADEX) 20 MG tablet, Take 1 tablet (20 mg total) by mouth daily., Disp: 90 tablet,  Rfl: 3   Vilazodone HCl 20 MG TABS, Take 1 tablet (20 mg total) by mouth daily., Disp: 90 tablet, Rfl: 4 Medication Side Effects: none  Family Medical/ Social History: Changes? No  MENTAL HEALTH EXAM:  Last menstrual period 08/07/2015.There is no height or weight on file to calculate BMI.  General Appearance: Casual, Neat, and Well Groomed  Eye Contact:  Good  Speech:  Clear and Coherent  Volume:  Normal  Mood:  NA  Affect:  Appropriate  Thought Process:  Coherent  Orientation:  Full (Time, Place, and Person)  Thought Content: Logical   Suicidal Thoughts:  No  Homicidal Thoughts:  No  Memory:  WNL  Judgement:  Good  Insight:  Good  Psychomotor Activity:  Normal  Concentration:  Concentration: Good  Recall:  Good  Fund of Knowledge: Good  Language: Good  Assets:  Desire for Improvement  ADL's:  Intact  Cognition: WNL  Prognosis:  Good    DIAGNOSES:    ICD-10-CM   1. GAD (generalized anxiety disorder)  F41.1 Vilazodone HCl 20 MG TABS    2. Acute posttraumatic stress disorder  F43.11 Vilazodone HCl 20 MG TABS    3. Mild episode of recurrent major depressive disorder (HCC)  F33.0 Vilazodone HCl 20 MG TABS    4. Adjustment disorder with mixed anxiety and depressed mood  F43.23 Vilazodone HCl 20 MG TABS    5. Moderate recurrent major depression (HCC)  F33.1 Vilazodone HCl 20 MG TABS      Receiving Psychotherapy: No    RECOMMENDATIONS:  Greater than 50% of 20 min face to face time with patient was spent on counseling and coordination of care.  We discussed her recent improvement with anxiety and depression. She is reporting feeling very stable right now. She wants to discuss weaning off the medication  in January 2024.  Continue Viibryd 20 mg daily To report and side effects or worsening symptoms To follow up in Jan, 2024 for reassessment  Provided emergency contact information Reviewed Staves, NP

## 2021-10-20 ENCOUNTER — Ambulatory Visit: Payer: 59 | Admitting: Addiction (Substance Use Disorder)

## 2021-10-22 NOTE — Progress Notes (Unsigned)
51 y.o. G56P0000 Married Caucasian female here for annual exam.    Patient complaining of vaginal dryness/discomfort. Water based lubricant.  Opening a second restaurant.   PCP:  Sela Hilding, MD   Patient's last menstrual period was 08/07/2015 (approximate).           Sexually active: Yes.    The current method of family planning is post menopausal status.    Exercising: Yes.     Strength, yoga, aerobics, walking Smoker:  no  Health Maintenance: Pap:   06-24-17 Neg:Neg HR HPV  History of abnormal Pap:  no MMG:  09-10-21 Neg/Birads2 Colonoscopy:  NEVER BMD: 06/03/21  Result :Osteopenia of spine and left hip.  TDaP:  08-22-15 Gardasil:   no HIV: Neg in past Hep C: no Screening Labs:  PCP   reports that she has never smoked. She has never used smokeless tobacco. She reports current alcohol use of about 1.0 standard drink of alcohol per week. She reports that she does not use drugs.  Past Medical History:  Diagnosis Date   Anxiety    Arthritis    OP   Breast cancer ([place])    Depression    Family history of breast cancer 02/13/2020   History of radiation therapy 04/17/20-06/03/20   IMRT to Left breast Dr. 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   Personal history of radiation therapy    Premature ovarian failure    Dx'd age 19   Thyroid disease    hypothyroid    Past Surgical History:  Procedure Laterality Date   BREAST BIOPSY Left 02/04/2020   breast and lymphnode   BREAST LUMPECTOMY Left 03/12/2020   BREAST LUMPECTOMY WITH RADIOACTIVE SEED AND SENTINEL LYMPH NODE BIOPSY Left 03/12/2020   Procedure: LEFT BREAST LUMPECTOMY WITH RADIOACTIVE SEED AND LEFT TARGETED RADIOACTIVE SEED LYMPH NODE BIOPSY AND LEFT SENTINEL LYMPH NODE MAPPING;  Surgeon: Erroll Luna, MD;  Location: [facility];  Service: General;  Laterality: Left;   DILATATION & CURETTAGE/HYSTEROSCOPY WITH MYOSURE N/A 10/07/2015   Procedure: DILATATION &  CURETTAGE/HYSTEROSCOPY WITH MYOSURE;  Surgeon: Nunzio Cobbs, MD;  Location: [facility];  Service: Gynecology;  Laterality: N/A;  endometrial polyp   SCAR REVISION Left 02/05/2021   Procedure: left breast SCAR REVISION;  Surgeon: Erroll Luna, MD;  Location: [facility];  Service: General;  Laterality: Left;  61   WISDOM TOOTH EXTRACTION      Current Outpatient Medications  Medication Sig Dispense Refill   calcium-vitamin D (OSCAL WITH D) 500-200 MG-UNIT tablet Take 1 tablet by mouth.     gabapentin (NEURONTIN) 300 MG capsule Take 1 capsule (300 mg total) by mouth 3 (three) times daily. 90 capsule 12   levothyroxine (SYNTHROID) 112 MCG tablet Take 1 tablet by mouth daily before breakfast. 90 tablet 0   Multiple Vitamins-Minerals (MULTIVITAMIN PO) Take 1 tablet by mouth daily.     RETIN-A 0.025 % cream SMARTSIG:sparingly Topical Every Night     tamoxifen (NOLVADEX) 20 MG tablet Take 1 tablet (20 mg total) by mouth daily. 90 tablet 3   Vilazodone HCl 20 MG TABS Take 1 tablet (20 mg total) by mouth daily. 90 tablet 4   VTAMA 1 % CREA Apply topically.     No current facility-administered medications for this visit.    Family History  Problem Relation Age of Onset   Cancer Mother 60       endometrial  cancer   Depression Mother    Multiple sclerosis Father    Other Father 19       right leg amputated below knee   Thyroid disease Sister        hypothyroidism   Hypertension Maternal Grandmother    Heart disease Maternal Grandmother    Depression Maternal Grandmother    Diabetes Maternal Grandfather    Other Maternal Grandfather        benign brain tumor   Dementia Paternal Grandmother    Alcohol abuse Paternal Grandfather    Prostate cancer Cousin 21       maternal cousin   Colon cancer Other        MGF's brother; dx 72s    Review of Systems  Genitourinary:  Positive for vaginal pain (vaginal dryness).  All other systems reviewed and are  negative.   Exam:   BP 106/60   Pulse 72   Ht 5' 2.5" (1.588 m)   Wt 146 lb (66.2 kg)   LMP 08/07/2015 (Approximate)   SpO2 98%   BMI 26.28 kg/m     General appearance: alert, cooperative and appears stated age Head: normocephalic, without obvious abnormality, atraumatic Neck: no adenopathy, supple, symmetrical, trachea midline and thyroid normal to inspection and palpation Lungs: clear to auscultation bilaterally Breasts: normal appearance, no masses or tenderness, No nipple retraction or dimpling, No nipple discharge or bleeding, No axillary adenopathy Heart: regular rate and rhythm Abdomen: soft, non-tender; no masses, no organomegaly Extremities: extremities normal, atraumatic, no cyanosis or edema Skin: skin color, texture, turgor normal. No rashes or lesions Lymph nodes: cervical, supraclavicular, and axillary nodes normal. Neurologic: grossly normal  Pelvic: External genitalia:  no lesions              No abnormal inguinal nodes palpated.              Urethra:  normal appearing urethra with no masses, tenderness or lesions              Bartholins and Skenes: normal                 Vagina: normal appearing vagina with normal color and discharge, no lesions              Cervix: no lesions.  Nonspecific erythema of cervix.              Pap taken: yes Bimanual Exam:  Uterus:  normal size, contour, position, consistency, mobility, non-tender              Adnexa: no mass, fullness, tenderness              Rectal exam: yes.  Confirms.              Anus:  normal sphincter tone, no lesions  Chaperone was present for exam:  Estill Bamberg, CMA  Assessment:   Well woman visit with gynecologic exam. Hx ovarian insufficiency.  Atrophy.  Left breast cancer.  Status post lumpectomy and XRT.  On Tamoxifen.  Osteopenia.  Hypothyroidism.   Plan: Mammogram screening discussed. Self breast awareness reviewed. Pap and HR HPV collected. Guidelines for Calcium, Vitamin D, regular exercise  program including cardiovascular and weight bearing exercise. Start Vagifem.  GI consult ordered for colonoscopy.  Follow up annually and prn.      After visit summary provided.

## 2021-10-23 ENCOUNTER — Other Ambulatory Visit: Payer: Self-pay | Admitting: *Deleted

## 2021-10-23 MED ORDER — GABAPENTIN 300 MG PO CAPS: 300.0000 mg | ORAL_CAPSULE | Freq: Three times a day (TID) | ORAL | 12 refills | Status: DC

## 2021-10-27 ENCOUNTER — Other Ambulatory Visit (HOSPITAL_COMMUNITY)
Admission: RE | Admit: 2021-10-27 | Discharge: 2021-10-27 | Disposition: A | Discharge status: Home / Self Care | Payer: 59 | Source: Ambulatory Visit | Attending: Obstetrics and Gynecology | Admitting: Obstetrics and Gynecology

## 2021-10-27 ENCOUNTER — Encounter: Payer: Self-pay | Admitting: Obstetrics and Gynecology

## 2021-10-27 ENCOUNTER — Ambulatory Visit (INDEPENDENT_AMBULATORY_CARE_PROVIDER_SITE_OTHER): Payer: 59 | Admitting: Obstetrics and Gynecology

## 2021-10-27 VITALS — BP 106/60 | HR 72 | Ht 62.5 in | Wt 146.0 lb

## 2021-10-27 DIAGNOSIS — N952 Postmenopausal atrophic vaginitis: Secondary | ICD-10-CM

## 2021-10-27 DIAGNOSIS — Z01419 Encounter for gynecological examination (general) (routine) without abnormal findings: Secondary | ICD-10-CM

## 2021-10-27 DIAGNOSIS — Z124 Encounter for screening for malignant neoplasm of cervix: Secondary | ICD-10-CM

## 2021-10-27 DIAGNOSIS — Z1211 Encounter for screening for malignant neoplasm of colon: Secondary | ICD-10-CM

## 2021-10-27 MED ORDER — ESTRADIOL 10 MCG VA TABS: 1.0000 | ORAL_TABLET | VAGINAL | 3 refills | Status: DC

## 2021-10-27 NOTE — Patient Instructions (Signed)

## 2021-10-29 ENCOUNTER — Telehealth: Payer: Self-pay | Admitting: *Deleted

## 2021-10-29 MED ORDER — ESTRADIOL 10 MCG VA TABS: 1.0000 | ORAL_TABLET | VAGINAL | 3 refills | Status: DC

## 2021-10-29 NOTE — Telephone Encounter (Signed)
Patient called stating estradiol vaginal tablets are too expensive at [facility]. She asked Rx to be sent to [facility] order pharmacy. Rx sent.

## 2021-10-30 LAB — CYTOLOGY - PAP
Comment: NEGATIVE
Diagnosis: NEGATIVE
High risk HPV: NEGATIVE

## 2021-11-03 ENCOUNTER — Ambulatory Visit: Payer: 59 | Admitting: Addiction (Substance Use Disorder)

## 2021-11-04 ENCOUNTER — Encounter: Payer: Self-pay | Admitting: Gastroenterology

## 2021-11-06 ENCOUNTER — Other Ambulatory Visit: Payer: Self-pay

## 2021-11-06 DIAGNOSIS — N952 Postmenopausal atrophic vaginitis: Secondary | ICD-10-CM

## 2021-11-06 MED ORDER — ESTRADIOL 10 MCG VA TABS: 1.0000 | ORAL_TABLET | VAGINAL | 3 refills | Status: DC

## 2021-11-06 MED ORDER — ESTRADIOL 10 MCG VA TABS: 1.0000 | ORAL_TABLET | VAGINAL | 0 refills | Status: DC

## 2021-11-06 NOTE — Telephone Encounter (Signed)
Denice Bors (Pharmacy Tech) from [facility] calling to report that they only carry the vaginal tabs in packs of 8 that cannot be broken up. He is requesting that we send in two separate Rx's. The first for #16 tabs w/ 0 refills for the first 3 weeks. Then the remainder in multiples of 8. Rx's sent.

## 2021-11-17 ENCOUNTER — Ambulatory Visit (AMBULATORY_SURGERY_CENTER): Payer: Self-pay

## 2021-11-17 ENCOUNTER — Ambulatory Visit: Payer: 59 | Admitting: Addiction (Substance Use Disorder)

## 2021-11-17 VITALS — Ht 62.5 in | Wt 145.0 lb

## 2021-11-17 DIAGNOSIS — Z1211 Encounter for screening for malignant neoplasm of colon: Secondary | ICD-10-CM

## 2021-11-17 MED ORDER — NA SULFATE-K SULFATE-MG SULF 17.5-3.13-1.6 GM/177ML PO SOLN: 1.0000 | ORAL | 0 refills | Status: DC

## 2021-11-17 NOTE — Progress Notes (Signed)
No egg or soy allergy known to patient  No issues known to pt with past sedation with any surgeries or procedures Patient denies ever being told they had issues or difficulty with intubation  No FH of Malignant Hyperthermia Pt is not on diet pills Pt is not on  home 02  Pt is not on blood thinners  Pt denies issues with constipation  No A fib or A flutter Have any cardiac testing pending--denied Pt instructed to use Single[employer]com or GoodRx for a price reduction on prep   PV conducted over phone. Instructions and Suprep coupon mailed to verified address.

## 2021-11-27 ENCOUNTER — Other Ambulatory Visit: Payer: Self-pay

## 2021-11-27 DIAGNOSIS — N952 Postmenopausal atrophic vaginitis: Secondary | ICD-10-CM

## 2021-11-27 NOTE — Telephone Encounter (Signed)
FYI. Rx refill request received for estradiol vaginal tablets by [facility]. However, pt should still have plenty available so I called and spoke with [place] [employer] operational officer) w/ [facility] and she confirmed that they still have the Rx that was sent for #24 tabs w/ 3 refills so she will cancel rx request in their system.

## 2021-12-04 ENCOUNTER — Encounter: Payer: Self-pay | Admitting: Gastroenterology

## 2021-12-07 ENCOUNTER — Encounter: Payer: Self-pay | Admitting: Certified Registered Nurse Anesthetist

## 2021-12-15 ENCOUNTER — Ambulatory Visit (AMBULATORY_SURGERY_CENTER): Payer: Commercial Managed Care - HMO | Admitting: Gastroenterology

## 2021-12-15 ENCOUNTER — Encounter: Payer: Self-pay | Admitting: Gastroenterology

## 2021-12-15 VITALS — BP 114/62 | HR 53 | Temp 97.5°F | Resp 12 | Ht 62.5 in | Wt 145.0 lb

## 2021-12-15 DIAGNOSIS — Z1211 Encounter for screening for malignant neoplasm of colon: Secondary | ICD-10-CM

## 2021-12-15 MED ORDER — SODIUM CHLORIDE 0.9 % IV SOLN: 500.0000 mL | Freq: Once | INTRAVENOUS | Status: DC

## 2021-12-15 NOTE — Progress Notes (Unsigned)
Report given to PACU, vss 

## 2021-12-15 NOTE — Patient Instructions (Signed)
Please read handouts provided. Continue present medications. Repeat colonoscopy in 10 years for screening.   YOU HAD AN ENDOSCOPIC PROCEDURE TODAY AT THE [facility]:   Refer to the procedure report that was given to you for any specific questions about what was found during the examination.  If the procedure report does not answer your questions, please call your gastroenterologist to clarify.  If you requested that your care partner not be given the details of your procedure findings, then the procedure report has been included in a sealed envelope for you to review at your convenience later.  YOU SHOULD EXPECT: Some feelings of bloating in the abdomen. Passage of more gas than usual.  Walking can help get rid of the air that was put into your GI tract during the procedure and reduce the bloating. If you had a lower endoscopy (such as a colonoscopy or flexible sigmoidoscopy) you may notice spotting of blood in your stool or on the toilet paper. If you underwent a bowel prep for your procedure, you may not have a normal bowel movement for a few days.  Please Note:  You might notice some irritation and congestion in your nose or some drainage.  This is from the oxygen used during your procedure.  There is no need for concern and it should clear up in a day or so.  SYMPTOMS TO REPORT IMMEDIATELY:  Following lower endoscopy (colonoscopy or flexible sigmoidoscopy):  Excessive amounts of blood in the stool  Significant tenderness or worsening of abdominal pains  Swelling of the abdomen that is new, acute  Fever of 100F or higher.  For urgent or emergent issues, a gastroenterologist can be reached at any hour by calling [phone]. Do not use MyChart messaging for urgent concerns.    DIET:  We do recommend a small meal at first, but then you may proceed to your regular diet.  Drink plenty of fluids but you should avoid alcoholic beverages for 24 hours.  ACTIVITY:  You should  plan to take it easy for the rest of today and you should NOT DRIVE or use heavy machinery until tomorrow (because of the sedation medicines used during the test).    FOLLOW UP: Our staff will call the number listed on your records the next business day following your procedure.  We will call around 7:15- 8:00 am to check on you and address any questions or concerns that you may have regarding the information given to you following your procedure. If we do not reach you, we will leave a message.     If any biopsies were taken you will be contacted by phone or by letter within the next 1-3 weeks.  Please call us at [phone] if you have not heard about the biopsies in 3 weeks.    SIGNATURES/CONFIDENTIALITY: You and/or your care partner have signed paperwork which will be entered into your electronic medical record.  These signatures attest to the fact that that the information above on your After Visit Summary has been reviewed and is understood.  Full responsibility of the confidentiality of this discharge information lies with you and/or your care-partner. 

## 2021-12-15 NOTE — Progress Notes (Signed)
Pt's states no medical or surgical changes since previsit or office visit. 

## 2021-12-15 NOTE — Op Note (Signed)
[facility] Patient Name: Michaela Roman Procedure Date: 12/15/2021 10:45 AM MRN: [id number] Endoscopist: Mauri Pole , MD Age: 51 Referring MD:  Date of Birth: [birth year] Gender: Female Account #: [id number] Procedure:                Colonoscopy Indications:              Screening for colorectal malignant neoplasm Medicines:                Monitored Anesthesia Care Procedure:                Pre-Anesthesia Assessment:                           - Prior to the procedure, a History and Physical                            was performed, and patient medications and                            allergies were reviewed. The patient's tolerance of                            previous anesthesia was also reviewed. The risks                            and benefits of the procedure and the sedation                            options and risks were discussed with the patient.                            All questions were answered, and informed consent                            was obtained. Prior Anticoagulants: The patient has                            taken no previous anticoagulant or antiplatelet                            agents. ASA Grade Assessment: II - A patient with                            mild systemic disease. After reviewing the risks                            and benefits, the patient was deemed in                            satisfactory condition to undergo the procedure.                           After obtaining informed consent, the colonoscope  was passed under direct vision. Throughout the                            procedure, the patient's blood pressure, pulse, and                            oxygen saturations were monitored continuously. The                            Olympus PCF-H190DL (#[id number]) Colonoscope was                            introduced through the anus and advanced to the the                             cecum, identified by appendiceal orifice and                            ileocecal valve. The colonoscopy was performed                            without difficulty. The patient tolerated the                            procedure well. The quality of the bowel                            preparation was good. The ileocecal valve,                            appendiceal orifice, and rectum were photographed. Scope In: 10:55:33 AM Scope Out: 11:09:37 AM Scope Withdrawal Time: 0 hours 7 minutes 34 seconds  Total Procedure Duration: 0 hours 14 minutes 4 seconds  Findings:                 The perianal and digital rectal examinations were                            normal.                           Non-bleeding external and internal hemorrhoids were                            found during retroflexion. The hemorrhoids were                            small.                           The exam was otherwise without abnormality. Complications:            No immediate complications. Estimated Blood Loss:     Estimated blood loss: none. Impression:               - Non-bleeding external and internal hemorrhoids.                           -  The examination was otherwise normal.                           - No specimens collected. Recommendation:           - Patient has a contact number available for                            emergencies. The signs and symptoms of potential                            delayed complications were discussed with the                            patient. Return to normal activities tomorrow.                            Written discharge instructions were provided to the                            patient.                           - Resume previous diet.                           - Continue present medications.                           - Repeat colonoscopy in 10 years for surveillance. Mauri Pole, MD 12/15/2021 11:17:13 AM This report has been signed  electronically.

## 2021-12-15 NOTE — Progress Notes (Unsigned)
[facility] History and Physical   Primary Care Physician:  Glenis Smoker, MD   Reason for Procedure:  Colorectal cancer screening  Plan:    Screening colonoscopy with possible interventions as needed     HPI: Michaela Roman is a very pleasant 51 y.o. female here for screening colonoscopy. Denies any nausea, vomiting, abdominal pain, melena or bright red blood per rectum  The risks and benefits as well as alternatives of endoscopic procedure(s) have been discussed and reviewed. All questions answered. The patient agrees to proceed.    Past Medical History:  Diagnosis Date   Anxiety    Arthritis    OP   Breast cancer ([place])    Depression    Family history of breast cancer 02/13/2020   History of radiation therapy 04/17/20-06/03/20   IMRT to Left breast Dr. 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   Personal history of radiation therapy    Premature ovarian failure    Dx'd age 51   Thyroid disease    hypothyroid    Past Surgical History:  Procedure Laterality Date   BREAST BIOPSY Left 02/04/2020   breast and lymphnode   BREAST LUMPECTOMY Left 03/12/2020   BREAST LUMPECTOMY WITH RADIOACTIVE SEED AND SENTINEL LYMPH NODE BIOPSY Left 03/12/2020   Procedure: LEFT BREAST LUMPECTOMY WITH RADIOACTIVE SEED AND LEFT TARGETED RADIOACTIVE SEED LYMPH NODE BIOPSY AND LEFT SENTINEL LYMPH NODE MAPPING;  Surgeon: Erroll Luna, MD;  Location: [facility];  Service: General;  Laterality: Left;   DILATATION & CURETTAGE/HYSTEROSCOPY WITH MYOSURE N/A 10/07/2015   Procedure: DILATATION & CURETTAGE/HYSTEROSCOPY WITH MYOSURE;  Surgeon: Nunzio Cobbs, MD;  Location: [facility];  Service: Gynecology;  Laterality: N/A;  endometrial polyp   SCAR REVISION Left 02/05/2021   Procedure: left breast SCAR REVISION;  Surgeon: Erroll Luna, MD;  Location: [facility];  Service: General;  Laterality: Left;  61    WISDOM TOOTH EXTRACTION      Prior to Admission medications   Medication Sig Start Date End Date Taking? Authorizing Provider  calcium-vitamin D (OSCAL WITH D) 500-200 MG-UNIT tablet Take 1 tablet by mouth. 12/10/20  Yes Magrinat, Virgie Dad, MD  Estradiol (VAGIFEM) 10 MCG TABS vaginal tablet Place 1 tablet (10 mcg total) vaginally 2 (two) times a week. Use every night before bed for two weeks when you first begin this medicine, then after the first two weeks, begin using it twice a week. 11/09/21  Yes Amundson Raliegh Ip, MD  Estradiol 10 MCG TABS vaginal tablet Place 1 tablet (10 mcg total) vaginally 2 (two) times a week. 11/09/21  Yes Amundson Raliegh Ip, MD  gabapentin (NEURONTIN) 300 MG capsule Take 1 capsule (300 mg total) by mouth 3 (three) times daily. 10/23/21  Yes Nicholas Lose, MD  Levomefolate Glucosamine (METHYL-FOLATE PO) Take by mouth.   Yes [provider]  levothyroxine (SYNTHROID) 112 MCG tablet Take 1 tablet by mouth daily before breakfast. 08/12/20  Yes Nunzio Cobbs, MD  Multiple Vitamins-Minerals (MULTIVITAMIN PO) Take 1 tablet by mouth daily.   Yes [provider]  RETIN-A 0.025 % cream SMARTSIG:sparingly Topical Every Night 10/06/21  Yes [provider]  tamoxifen (NOLVADEX) 20 MG tablet Take 1 tablet (20 mg total) by mouth daily. 06/04/21  Yes Nicholas Lose, MD  Vilazodone HCl 20 MG TABS Take 1 tablet (20 mg total) by mouth daily. 10/14/21  Yes White, Brian A, NP  VTAMA 1 % CREA Apply topically. 10/15/21  Yes [provider]    Current Outpatient Medications  Medication Sig Dispense Refill   calcium-vitamin D (OSCAL WITH D) 500-200 MG-UNIT tablet Take 1 tablet by mouth.     Estradiol (VAGIFEM) 10 MCG TABS vaginal tablet Place 1 tablet (10 mcg total) vaginally 2 (two) times a week. Use every night before bed for two weeks when you first begin this medicine, then after the first two weeks, begin using it twice a week. 16  tablet 0   Estradiol 10 MCG TABS vaginal tablet Place 1 tablet (10 mcg total) vaginally 2 (two) times a week. 24 tablet 3   gabapentin (NEURONTIN) 300 MG capsule Take 1 capsule (300 mg total) by mouth 3 (three) times daily. 90 capsule 12   Levomefolate Glucosamine (METHYL-FOLATE PO) Take by mouth.     levothyroxine (SYNTHROID) 112 MCG tablet Take 1 tablet by mouth daily before breakfast. 90 tablet 0   Multiple Vitamins-Minerals (MULTIVITAMIN PO) Take 1 tablet by mouth daily.     RETIN-A 0.025 % cream SMARTSIG:sparingly Topical Every Night     tamoxifen (NOLVADEX) 20 MG tablet Take 1 tablet (20 mg total) by mouth daily. 90 tablet 3   Vilazodone HCl 20 MG TABS Take 1 tablet (20 mg total) by mouth daily. 90 tablet 4   VTAMA 1 % CREA Apply topically.     Current Facility-Administered Medications  Medication Dose Route Frequency Provider Last Rate Last Admin   0.9 %  sodium chloride infusion  500 mL Intravenous Once Mauri Pole, MD        Allergies as of 12/15/2021 - Review Complete 12/15/2021  Allergen Reaction Noted   Sertraline Hives 05/22/2019    Family History  Problem Relation Age of Onset   Cancer Mother 34       endometrial cancer   Depression Mother    Multiple sclerosis Father    Other Father 63       right leg amputated below knee   Thyroid disease Sister        hypothyroidism   Hypertension Maternal Grandmother    Heart disease Maternal Grandmother    Depression Maternal Grandmother    Diabetes Maternal Grandfather    Other Maternal Grandfather        benign brain tumor   Dementia Paternal Grandmother    Alcohol abuse Paternal Grandfather    Prostate cancer Cousin 23       maternal cousin   Colon cancer Other        MGF's brother; dx 81s   Colon polyps Neg Hx    Esophageal cancer Neg Hx    Rectal cancer Neg Hx    Stomach cancer Neg Hx     Social History   Socioeconomic History   Marital status: Married    Spouse name: Not on file   Number of  children: Not on file   Years of education: Not on file   Highest education level: Not on file  Occupational History   Not on file  Tobacco Use   Smoking status: Never   Smokeless tobacco: Never  Vaping Use   Vaping Use: Never used  Substance and Sexual Activity   Alcohol use: Yes    Alcohol/week: 1.0 standard drink of alcohol    Types: 1 Glasses of wine per week    Comment: one drink a week   Drug use: No   Sexual activity: Yes  Partners: Female, Female    Birth control/protection: Post-menopausal, None  Other Topics Concern   Not on file  Social History Narrative   Not on file   Social Determinants of Health   Financial Resource Strain: Not on file  Food Insecurity: Not on file  Transportation Needs: Not on file  Physical Activity: Not on file  Stress: Not on file  Social Connections: Not on file  Intimate Partner Violence: Not on file    Review of Systems:  All other review of systems negative except as mentioned in the HPI.  Physical Exam: Vital signs in last 24 hours: Blood Pressure (Abnormal) 91/58   Pulse 61   Temperature (Abnormal) 97.5 F (36.4 C) (Temporal)   Height 5' 2.5" (1.588 m)   Weight 145 lb (65.8 kg)   Last Menstrual Period 08/07/2015 (Approximate)   Oxygen Saturation 100%   Body Mass Index 26.10 kg/m  General:   Alert, NAD Lungs:  Clear .   Heart:  Regular rate and rhythm Abdomen:  Soft, nontender and nondistended. Neuro/Psych:  Alert and cooperative. Normal mood and affect. A and O x 3  Reviewed labs, radiology imaging, old records and pertinent past GI work up  Patient is appropriate for planned procedure(s) and anesthesia in an ambulatory setting   K. Denzil Magnuson , MD [phone]

## 2021-12-15 NOTE — Progress Notes (Signed)
Patient complaining of right eye stinging. Right eye noted to be slightly red, no swelling noted. Patient encouraged to use saline drops when she gets home. Patient told to go to ED if pain gets worse. Will ask patient about Rrght eye on follow-up phone call tomorrow. B.Alexie Samson RN.

## 2021-12-16 ENCOUNTER — Telehealth: Payer: Self-pay

## 2021-12-16 NOTE — Telephone Encounter (Signed)
Attempted to reach patient for post-procedure f/u call. No answer. Left message for her to please not hesitate to call us if she has any questions/concerns regarding her care. 

## 2022-01-11 ENCOUNTER — Ambulatory Visit: Payer: Commercial Managed Care - HMO | Attending: Surgery

## 2022-01-11 VITALS — Wt 141.2 lb

## 2022-01-11 DIAGNOSIS — Z483 Aftercare following surgery for neoplasm: Secondary | ICD-10-CM | POA: Insufficient documentation

## 2022-01-11 NOTE — Therapy (Signed)
OUTPATIENT PHYSICAL THERAPY SOZO SCREENING NOTE   Patient Name: Michaela Roman MRN: [id number] DOB:08/12/1970, 51 y.o., female Today's Date: 01/11/2022  PCP: Glenis Smoker, MD REFERRING PROVIDER: Erroll Luna, MD   PT End of Session - 01/11/22 1610     Visit Number 10   # unchanged due to screen only   PT Start Time 1607    PT Stop Time 1611    PT Time Calculation (min) 4 min    Activity Tolerance Patient tolerated treatment well    Behavior During Therapy [facility] for tasks assessed/performed              Past Medical History:  Diagnosis Date   Anxiety    Arthritis    OP   Breast cancer ([place])    Depression    Family history of breast cancer 02/13/2020   History of radiation therapy 04/17/20-06/03/20   IMRT to Left breast Dr. 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   Personal history of radiation therapy    Premature ovarian failure    Dx'd age 53   Thyroid disease    hypothyroid   Past Surgical History:  Procedure Laterality Date   BREAST BIOPSY Left 02/04/2020   breast and lymphnode   BREAST LUMPECTOMY Left 03/12/2020   BREAST LUMPECTOMY WITH RADIOACTIVE SEED AND SENTINEL LYMPH NODE BIOPSY Left 03/12/2020   Procedure: LEFT BREAST LUMPECTOMY WITH RADIOACTIVE SEED AND LEFT TARGETED RADIOACTIVE SEED LYMPH NODE BIOPSY AND LEFT SENTINEL LYMPH NODE [place];  Surgeon: Erroll Luna, MD;  Location: [facility];  Service: General;  Laterality: Left;   DILATATION & CURETTAGE/HYSTEROSCOPY WITH MYOSURE N/A 10/07/2015   Procedure: DILATATION & CURETTAGE/HYSTEROSCOPY WITH MYOSURE;  Surgeon: Nunzio Cobbs, MD;  Location: [facility];  Service: Gynecology;  Laterality: N/A;  endometrial polyp   SCAR REVISION Left 02/05/2021   Procedure: left breast SCAR REVISION;  Surgeon: Erroll Luna, MD;  Location: [facility];  Service: General;  Laterality: Left;  [place] EXTRACTION     Patient  Active Problem List   Diagnosis Date Noted   Bilateral carpal tunnel syndrome 07/22/2020   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 Patient underwent a left lumpectomy and sentinel node biopsy (1/4 positive) on 03/12/2020. It is ER/PR positive and HER2 negative with a Ki67 of 30%.     PRECAUTIONS: left UE Lymphedema risk,   SUBJECTIVE: here for 3 month SOZO  PAIN:  Are you having pain? No  SOZO SCREENING: Patient was assessed today using the SOZO machine to determine the lymphedema index score. This was compared to her baseline score. It was determined that she is within the recommended range when compared to her baseline and no further action is needed at this time. She will continue SOZO screenings. These are done every 3 months for 2 years post operatively followed by every 6 months for 2 years, and then annually.  L-DEX FLOWSHEETS - 01/11/22 1600       L-DEX LYMPHEDEMA SCREENING   Measurement Type Unilateral    L-DEX MEASUREMENT EXTREMITY Upper Extremity    POSITION  Standing    DOMINANT SIDE Right    At Risk Side Left    BASELINE SCORE (UNILATERAL) -0.7    L-DEX SCORE (UNILATERAL) -0.2    VALUE CHANGE (UNILAT) 0.5              PLAN: continue 3 month SOZO until at least 03/12/22  [name], PTA 01/11/2022, 4:11 PM

## 2022-03-17 ENCOUNTER — Ambulatory Visit: Payer: Self-pay | Admitting: Behavioral Health

## 2022-03-19 ENCOUNTER — Telehealth (INDEPENDENT_AMBULATORY_CARE_PROVIDER_SITE_OTHER): Payer: Commercial Managed Care - HMO | Admitting: Behavioral Health

## 2022-03-19 ENCOUNTER — Encounter: Payer: Self-pay | Admitting: Behavioral Health

## 2022-03-19 DIAGNOSIS — F331 Major depressive disorder, recurrent, moderate: Secondary | ICD-10-CM | POA: Diagnosis not present

## 2022-03-19 DIAGNOSIS — F411 Generalized anxiety disorder: Secondary | ICD-10-CM

## 2022-03-19 NOTE — Progress Notes (Signed)
Michaela Roman [id number] Mar 11, 1970 52 y.o.  Virtual Visit via Video Note  I connected with pt @ on 03/19/22 at  8:30 AM EST by a video enabled telemedicine application and verified that I am speaking with the correct person using two identifiers.   I discussed the limitations of evaluation and management by telemedicine and the availability of in person appointments. The patient expressed understanding and agreed to proceed.  I discussed the assessment and treatment plan with the patient. The patient was provided an opportunity to ask questions and all were answered. The patient agreed with the plan and demonstrated an understanding of the instructions.   The patient was advised to call back or seek an in-person evaluation if the symptoms worsen or if the condition fails to improve as anticipated.  I provided 30 minutes of non-face-to-face time during this encounter.  The patient was located at home.  The provider was located at [place].   Elwanda Brooklyn, NP   Subjective:   Patient ID:  Michaela Roman is a 52 y.o. (DOB [birth year]) female.  Chief Complaint:  Chief Complaint  Patient presents with   Depression   Anxiety   Follow-up   Patient Education    HPI  52  year old female presents to this office via video visit for  follow up and medication management. She continues to report Viibryd is working well for anxiety and depression but says that she is finally ready and in a good place to wean off medication as previously discussed. No social changes and she is slowing things down for the winter.  She is getting about 7-8 hours of sleep per night.  No mania, no psychosis. No SI/HI. Continues with Sammuel Cooper for therapy.    Wellbutrin- Insomnia, racing thoughts Stopped after 2 weeks.   Effexor-pt said was not controlling depression Zoloft- Took for a few weeks. Had insomnia. Developed hives.  Lexapro Lorazepam- Had a calming effect. Not effective  with decreasing middle of the night awakenings. Melatonin- Vivid dreams Trazodone- Felt sedated but did not sleep.  Lunesta '1mg'$ - said was not effective       Review of Systems:  Review of Systems  Constitutional: Negative.   Allergic/Immunologic: Negative.   Neurological: Negative.   Psychiatric/Behavioral: Negative.      Medications: I have reviewed the patient's current medications.  Current Outpatient Medications  Medication Sig Dispense Refill   calcium-vitamin D (OSCAL WITH D) 500-200 MG-UNIT tablet Take 1 tablet by mouth.     Estradiol (VAGIFEM) 10 MCG TABS vaginal tablet Place 1 tablet (10 mcg total) vaginally 2 (two) times a week. Use every night before bed for two weeks when you first begin this medicine, then after the first two weeks, begin using it twice a week. 16 tablet 0   Estradiol 10 MCG TABS vaginal tablet Place 1 tablet (10 mcg total) vaginally 2 (two) times a week. 24 tablet 3   gabapentin (NEURONTIN) 300 MG capsule Take 1 capsule (300 mg total) by mouth 3 (three) times daily. 90 capsule 12   Levomefolate Glucosamine (METHYL-FOLATE PO) Take by mouth.     levothyroxine (SYNTHROID) 112 MCG tablet Take 1 tablet by mouth daily before breakfast. 90 tablet 0   Multiple Vitamins-Minerals (MULTIVITAMIN PO) Take 1 tablet by mouth daily.     RETIN-A 0.025 % cream SMARTSIG:sparingly Topical Every Night     tamoxifen (NOLVADEX) 20 MG tablet Take 1 tablet (20 mg total) by mouth daily. 90 tablet 3  Vilazodone HCl 20 MG TABS Take 1 tablet (20 mg total) by mouth daily. 90 tablet 4   VTAMA 1 % CREA Apply topically.     No current facility-administered medications for this visit.    Medication Side Effects: None  Allergies:  Allergies  Allergen Reactions   Sertraline Hives    Past Medical History:  Diagnosis Date   Anxiety    Arthritis    OP   Breast cancer ([place])    Depression    Family history of breast cancer 02/13/2020   History of radiation therapy  04/17/20-06/03/20   IMRT to Left breast Dr. 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   Personal history of radiation therapy    Premature ovarian failure    Dx'd age 59   Thyroid disease    hypothyroid    Family History  Problem Relation Age of Onset   Cancer Mother 74       endometrial cancer   Depression Mother    Multiple sclerosis Father    Other Father 11       right leg amputated below knee   Thyroid disease Sister        hypothyroidism   Hypertension Maternal Grandmother    Heart disease Maternal Grandmother    Depression Maternal Grandmother    Diabetes Maternal Grandfather    Other Maternal Grandfather        benign brain tumor   Dementia Paternal Grandmother    Alcohol abuse Paternal Grandfather    Prostate cancer Cousin 4       maternal cousin   Colon cancer Other        MGF's brother; dx 39s   Colon polyps Neg Hx    Esophageal cancer Neg Hx    Rectal cancer Neg Hx    Stomach cancer Neg Hx     Social History   Socioeconomic History   Marital status: Married    Spouse name: Not on file   Number of children: Not on file   Years of education: Not on file   Highest education level: Not on file  Occupational History   Not on file  Tobacco Use   Smoking status: Never   Smokeless tobacco: Never  Vaping Use   Vaping Use: Never used  Substance and Sexual Activity   Alcohol use: Yes    Alcohol/week: 1.0 standard drink of alcohol    Types: 1 Glasses of wine per week    Comment: one drink a week   Drug use: No   Sexual activity: Yes    Partners: Female, Female    Birth control/protection: Post-menopausal, None  Other Topics Concern   Not on file  Social History Narrative   Not on file   Social Determinants of Health   Financial Resource Strain: Not on file  Food Insecurity: Not on file  Transportation Needs: Not on file  Physical Activity: Not on file  Stress: Not on file  Social Connections: Not on  file  Intimate Partner Violence: Not on file    Past Medical History, Surgical history, Social history, and Family history were reviewed and updated as appropriate.   Please see review of systems for further details on the patient's review from today.   Objective:   Physical Exam:  LMP 08/07/2015 (Approximate)   Physical Exam Psychiatric:        Attention and Perception: Attention and perception normal.  Mood and Affect: Mood and affect normal.        Speech: Speech normal.        Behavior: Behavior normal. Behavior is cooperative.        Cognition and Memory: Cognition and memory normal.        Judgment: Judgment normal.     Lab Review:     Component Value Date/Time   NA 140 10/01/2021 1501   NA 140 10/17/2018 0922   K 3.9 10/01/2021 1501   CL 106 10/01/2021 1501   CO2 29 10/01/2021 1501   GLUCOSE 101 (H) 10/01/2021 1501   BUN 15 10/01/2021 1501   BUN 12 10/17/2018 0922   CREATININE 1.05 (H) 10/01/2021 1501   CREATININE 0.95 08/22/2015 1653   CALCIUM 8.7 (L) 10/01/2021 1501   PROT 6.7 10/01/2021 1501   PROT 6.5 10/17/2018 0922   ALBUMIN 4.3 10/01/2021 1501   ALBUMIN 4.5 10/17/2018 0922   AST 18 10/01/2021 1501   ALT 14 10/01/2021 1501   ALKPHOS 38 10/01/2021 1501   BILITOT 0.4 10/01/2021 1501   GFRNONAA >60 10/01/2021 1501   GFRAA 81 10/17/2018 0922       Component Value Date/Time   WBC 4.5 10/01/2021 1501   WBC 5.3 03/07/2020 1050   RBC 3.99 10/01/2021 1501   HGB 12.5 10/01/2021 1501   HGB 13.5 10/17/2018 0922   HCT 35.7 (L) 10/01/2021 1501   HCT 40.9 10/17/2018 0922   PLT 163 10/01/2021 1501   PLT 188 10/17/2018 0922   MCV 89.5 10/01/2021 1501   MCV 93 10/17/2018 0922   MCH 31.3 10/01/2021 1501   MCHC 35.0 10/01/2021 1501   RDW 12.2 10/01/2021 1501   RDW 13.1 10/17/2018 0922   LYMPHSABS 1.4 10/01/2021 1501   MONOABS 0.4 10/01/2021 1501   EOSABS 0.1 10/01/2021 1501   BASOSABS 0.0 10/01/2021 1501    No results found for: "POCLITH",  "LITHIUM"   No results found for: "PHENYTOIN", "PHENOBARB", "VALPROATE", "CBMZ"   .res Assessment: Plan:    RECOMMENDATIONS:    Greater than 50% of 30  min face to face time with patient was spent on counseling and coordination of care.  We discussed her continued improvement with anxiety and depression. She is reporting feeling very stable right now. She says that she is ready to try weaning off medication. To reduce Viibryd to 10 mg daily for one week. Then 5 mg for one week, then 5 mg every other day for one week. To report and side effects or worsening symptoms To follow up in 3 months to reassess Provided emergency contact information Reviewed [place], NP             Sequoyah was seen today for depression, anxiety, follow-up and patient education.  Diagnoses and all orders for this visit:  Major depressive disorder, recurrent episode, moderate (HCC)  Generalized anxiety disorder     Please see After Visit Summary for patient specific instructions.  Future Appointments  Date Time Provider [place]  07/12/2022  3:10 PM Suanne Marker, PTA [facility] None  10/04/2022  3:00 PM Nicholas Lose, MD [facility] None  11/02/2022  3:00 PM Nunzio Cobbs, MD [facility] None    No orders of the defined types were placed in this encounter.     -------------------------------

## 2022-04-09 ENCOUNTER — Other Ambulatory Visit: Payer: Self-pay | Admitting: Sports Medicine

## 2022-04-09 DIAGNOSIS — M25561 Pain in right knee: Secondary | ICD-10-CM

## 2022-04-27 ENCOUNTER — Other Ambulatory Visit: Payer: Self-pay | Admitting: Sports Medicine

## 2022-04-27 ENCOUNTER — Ambulatory Visit
Admission: RE | Admit: 2022-04-27 | Discharge: 2022-04-27 | Disposition: A | Discharge status: Home / Self Care | Payer: Commercial Managed Care - HMO | Source: Ambulatory Visit | Attending: Sports Medicine | Admitting: Sports Medicine

## 2022-04-27 DIAGNOSIS — M25561 Pain in right knee: Secondary | ICD-10-CM

## 2022-05-10 ENCOUNTER — Encounter: Payer: Self-pay | Admitting: Addiction (Substance Use Disorder)

## 2022-06-07 ENCOUNTER — Other Ambulatory Visit: Payer: Self-pay | Admitting: Hematology and Oncology

## 2022-06-10 ENCOUNTER — Encounter: Payer: Self-pay | Admitting: Hematology and Oncology

## 2022-06-18 ENCOUNTER — Telehealth: Payer: Self-pay | Admitting: Behavioral Health

## 2022-06-21 ENCOUNTER — Encounter: Payer: Self-pay | Admitting: Behavioral Health

## 2022-06-21 ENCOUNTER — Telehealth (INDEPENDENT_AMBULATORY_CARE_PROVIDER_SITE_OTHER): Payer: Commercial Managed Care - HMO | Admitting: Behavioral Health

## 2022-06-21 DIAGNOSIS — F411 Generalized anxiety disorder: Secondary | ICD-10-CM | POA: Diagnosis not present

## 2022-06-21 DIAGNOSIS — F3342 Major depressive disorder, recurrent, in full remission: Secondary | ICD-10-CM

## 2022-06-21 NOTE — Progress Notes (Signed)
Michaela Roman [id number] 1970-10-22 52 y.o.  Virtual Visit via Video Note  I connected with pt @ on 06/21/22 at  1:30 PM EDT by a video enabled telemedicine application and verified that I am speaking with the correct person using two identifiers.   I discussed the limitations of evaluation and management by telemedicine and the availability of in person appointments. The patient expressed understanding and agreed to proceed.  I discussed the assessment and treatment plan with the patient. The patient was provided an opportunity to ask questions and all were answered. The patient agreed with the plan and demonstrated an understanding of the instructions.   The patient was advised to call back or seek an in-person evaluation if the symptoms worsen or if the condition fails to improve as anticipated.  I provided 30  minutes of non-face-to-face time during this encounter.  The patient was located at home.  The provider was located at [facility].   Joan Flores, NP   Subjective:   Patient ID:  Michaela Roman is a 52 y.o. (DOB [birth year]) female.  Chief Complaint: No chief complaint on file.   HPI 52  year old female presents to this office via video visit for  follow up and medication management. She has been completely weaned off medication for 2.5 months now. She says she is feeling good right now. No social changes.  She is getting about 7-8 hours of sleep per night.  Requesting 6 mo f/u. No mania, no psychosis. No SI/HI. Continues with Zoila Shutter for therapy.    Wellbutrin- Insomnia, racing thoughts Stopped after 2 weeks.   Effexor-pt said was not controlling depression Zoloft- Took for a few weeks. Had insomnia. Developed hives.  Lexapro Lorazepam- Had a calming effect. Not effective with decreasing middle of the night awakenings. Melatonin- Vivid dreams Trazodone- Felt sedated but did not sleep.  Lunesta 1mg - said was not effective     Review of  Systems:  Review of Systems  Constitutional: Negative.   Allergic/Immunologic: Negative.   Neurological: Negative.   Psychiatric/Behavioral: Negative.      Medications: I have reviewed the patient's current medications.  Current Outpatient Medications  Medication Sig Dispense Refill   calcium-vitamin D (OSCAL WITH D) 500-200 MG-UNIT tablet Take 1 tablet by mouth.     Estradiol (VAGIFEM) 10 MCG TABS vaginal tablet Place 1 tablet (10 mcg total) vaginally 2 (two) times a week. Use every night before bed for two weeks when you first begin this medicine, then after the first two weeks, begin using it twice a week. 16 tablet 0   Estradiol 10 MCG TABS vaginal tablet Place 1 tablet (10 mcg total) vaginally 2 (two) times a week. 24 tablet 3   gabapentin (NEURONTIN) 300 MG capsule Take 1 capsule (300 mg total) by mouth 3 (three) times daily. 90 capsule 12   Levomefolate Glucosamine (METHYL-FOLATE PO) Take by mouth.     levothyroxine (SYNTHROID) 112 MCG tablet Take 1 tablet by mouth daily before breakfast. 90 tablet 0   Multiple Vitamins-Minerals (MULTIVITAMIN PO) Take 1 tablet by mouth daily.     RETIN-A 0.025 % cream SMARTSIG:sparingly Topical Every Night     tamoxifen (NOLVADEX) 20 MG tablet TAKE 1 TABLET(20 MG) BY MOUTH DAILY 90 tablet 3   Vilazodone HCl 20 MG TABS Take 1 tablet (20 mg total) by mouth daily. 90 tablet 4   VTAMA 1 % CREA Apply topically.     No current facility-administered medications for this visit.  Medication Side Effects: None  Allergies:  Allergies  Allergen Reactions   Sertraline Hives    Past Medical History:  Diagnosis Date   Anxiety    Arthritis    OP   Breast cancer    Depression    Family history of breast cancer 02/13/2020   History of radiation therapy 04/17/20-06/03/20   IMRT to Left breast Dr. 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   Personal history of radiation therapy    Premature ovarian  failure    Dx'd age 22   Thyroid disease    hypothyroid    Family History  Problem Relation Age of Onset   Cancer Mother 57       endometrial cancer   Depression Mother    Multiple sclerosis Father    Other Father 57       right leg amputated below knee   Thyroid disease Sister        hypothyroidism   Hypertension Maternal Grandmother    Heart disease Maternal Grandmother    Depression Maternal Grandmother    Diabetes Maternal Grandfather    Other Maternal Grandfather        benign brain tumor   Dementia Paternal Grandmother    Alcohol abuse Paternal Grandfather    Prostate cancer Cousin 65       maternal cousin   Colon cancer Other        MGF's brother; dx 5s   Colon polyps Neg Hx    Esophageal cancer Neg Hx    Rectal cancer Neg Hx    Stomach cancer Neg Hx     Social History   Socioeconomic History   Marital status: Married    Spouse name: Not on file   Number of children: Not on file   Years of education: Not on file   Highest education level: Not on file  Occupational History   Not on file  Tobacco Use   Smoking status: Never   Smokeless tobacco: Never  Vaping Use   Vaping Use: Never used  Substance and Sexual Activity   Alcohol use: Yes    Alcohol/week: 1.0 standard drink of alcohol    Types: 1 Glasses of wine per week    Comment: one drink a week   Drug use: No   Sexual activity: Yes    Partners: Female, Female    Birth control/protection: Post-menopausal, None  Other Topics Concern   Not on file  Social History Narrative   Not on file   Social Determinants of Health   Financial Resource Strain: Not on file  Food Insecurity: Not on file  Transportation Needs: Not on file  Physical Activity: Not on file  Stress: Not on file  Social Connections: Not on file  Intimate Partner Violence: Not on file    Past Medical History, Surgical history, Social history, and Family history were reviewed and updated as appropriate.   Please see review of  systems for further details on the patient's review from today.   Objective:   Physical Exam:  LMP 08/07/2015 (Approximate)   Physical Exam Psychiatric:        Attention and Perception: Attention and perception normal.        Mood and Affect: Mood and affect normal.        Speech: Speech normal.        Behavior: Behavior normal. Behavior is cooperative.  Cognition and Memory: Cognition and memory normal.        Judgment: Judgment normal.     Lab Review:     Component Value Date/Time   NA 140 10/01/2021 1501   NA 140 10/17/2018 0922   K 3.9 10/01/2021 1501   CL 106 10/01/2021 1501   CO2 29 10/01/2021 1501   GLUCOSE 101 (H) 10/01/2021 1501   BUN 15 10/01/2021 1501   BUN 12 10/17/2018 0922   CREATININE 1.05 (H) 10/01/2021 1501   CREATININE 0.95 08/22/2015 1653   CALCIUM 8.7 (L) 10/01/2021 1501   PROT 6.7 10/01/2021 1501   PROT 6.5 10/17/2018 0922   ALBUMIN 4.3 10/01/2021 1501   ALBUMIN 4.5 10/17/2018 0922   AST 18 10/01/2021 1501   ALT 14 10/01/2021 1501   ALKPHOS 38 10/01/2021 1501   BILITOT 0.4 10/01/2021 1501   GFRNONAA >60 10/01/2021 1501   GFRAA 81 10/17/2018 0922       Component Value Date/Time   WBC 4.5 10/01/2021 1501   WBC 5.3 03/07/2020 1050   RBC 3.99 10/01/2021 1501   HGB 12.5 10/01/2021 1501   HGB 13.5 10/17/2018 0922   HCT 35.7 (L) 10/01/2021 1501   HCT 40.9 10/17/2018 0922   PLT 163 10/01/2021 1501   PLT 188 10/17/2018 0922   MCV 89.5 10/01/2021 1501   MCV 93 10/17/2018 0922   MCH 31.3 10/01/2021 1501   MCHC 35.0 10/01/2021 1501   RDW 12.2 10/01/2021 1501   RDW 13.1 10/17/2018 0922   LYMPHSABS 1.4 10/01/2021 1501   MONOABS 0.4 10/01/2021 1501   EOSABS 0.1 10/01/2021 1501   BASOSABS 0.0 10/01/2021 1501    No results found for: "POCLITH", "LITHIUM"   No results found for: "PHENYTOIN", "PHENOBARB", "VALPROATE", "CBMZ"   .res Assessment: Plan:    Recommendations  Greater than 50% of 30  min face to face time with patient was  spent on counseling and coordination of care.  We discussed her continued improvement with anxiety and depression. She is reporting feeling very stable right now off the medication.  To report and side effects or worsening symptoms To follow up in 3 months to reassess Provided emergency contact information Reviewed PDMP      There are no diagnoses linked to this encounter.   Please see After Visit Summary for patient specific instructions.  Future Appointments  Date Time Provider Department Center  07/12/2022  3:10 PM Alphonzo Cruise, PTA [facility] None  10/04/2022  3:00 PM Serena Croissant, MD [facility] None  11/02/2022  3:00 PM Patton Salles, MD [facility] None    No orders of the defined types were placed in this encounter.     -------------------------------

## 2022-07-12 ENCOUNTER — Ambulatory Visit: Payer: Commercial Managed Care - HMO | Attending: Surgery

## 2022-07-12 VITALS — Wt 139.2 lb

## 2022-07-12 DIAGNOSIS — Z483 Aftercare following surgery for neoplasm: Secondary | ICD-10-CM | POA: Insufficient documentation

## 2022-07-12 NOTE — Therapy (Signed)
OUTPATIENT PHYSICAL THERAPY SOZO SCREENING NOTE   Patient Name: Michaela Roman MRN: [id number] DOB:07/26/1970, 52 y.o., female Today's Date: 07/12/2022  PCP: Shon Hale, MD REFERRING PROVIDER: Harriette Bouillon, MD   PT End of Session - 07/12/22 1518     Visit Number 10   # unchanged due to screen only   PT Start Time 1517    PT Stop Time 1521    PT Time Calculation (min) 4 min    Activity Tolerance Patient tolerated treatment well    Behavior During Therapy [facility] for tasks assessed/performed              Past Medical History:  Diagnosis Date   Anxiety    Arthritis    OP   Breast cancer (HCC)    Depression    Family history of breast cancer 02/13/2020   History of radiation therapy 04/17/20-06/03/20   IMRT to Left breast Dr. 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   Personal history of radiation therapy    Premature ovarian failure    Dx'd age 36   Thyroid disease    hypothyroid   Past Surgical History:  Procedure Laterality Date   BREAST BIOPSY Left 02/04/2020   breast and lymphnode   BREAST LUMPECTOMY Left 03/12/2020   BREAST LUMPECTOMY WITH RADIOACTIVE SEED AND SENTINEL LYMPH NODE BIOPSY Left 03/12/2020   Procedure: LEFT BREAST LUMPECTOMY WITH RADIOACTIVE SEED AND LEFT TARGETED RADIOACTIVE SEED LYMPH NODE BIOPSY AND LEFT SENTINEL LYMPH NODE MAPPING;  Surgeon: Harriette Bouillon, MD;  Location: [facility] OR;  Service: General;  Laterality: Left;   DILATATION & CURETTAGE/HYSTEROSCOPY WITH MYOSURE N/A 10/07/2015   Procedure: DILATATION & CURETTAGE/HYSTEROSCOPY WITH MYOSURE;  Surgeon: Patton Salles, MD;  Location: [facility];  Service: Gynecology;  Laterality: N/A;  endometrial polyp   SCAR REVISION Left 02/05/2021   Procedure: left breast SCAR REVISION;  Surgeon: Harriette Bouillon, MD;  Location: [facility];  Service: General;  Laterality: Left;  61   WISDOM TOOTH EXTRACTION     Patient  Active Problem List   Diagnosis Date Noted   Bilateral carpal tunnel syndrome 07/22/2020   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 Patient underwent a left lumpectomy and sentinel node biopsy (1/4 positive) on 03/12/2020. It is ER/PR positive and HER2 negative with a Ki67 of 30%.     PRECAUTIONS: left UE Lymphedema risk,   SUBJECTIVE: here for 6 month SOZO  PAIN:  Are you having pain? No  SOZO SCREENING: Patient was assessed today using the SOZO machine to determine the lymphedema index score. This was compared to her baseline score. It was determined that she is within the recommended range when compared to her baseline and no further action is needed at this time. She will continue SOZO screenings. These are done every 3 months for 2 years post operatively followed by every 6 months for 2 years, and then annually.  L-DEX FLOWSHEETS - 07/12/22 1500       L-DEX LYMPHEDEMA SCREENING   Measurement Type Unilateral    L-DEX MEASUREMENT EXTREMITY Upper Extremity    POSITION  Standing    DOMINANT SIDE Right    At Risk Side Left    BASELINE SCORE (UNILATERAL) -0.7    L-DEX SCORE (UNILATERAL) 1.4    VALUE CHANGE (UNILAT) 2.1                Hermenia Bers, PTA 07/12/2022, 3:20 PM

## 2022-08-24 ENCOUNTER — Encounter: Payer: Self-pay | Admitting: Obstetrics and Gynecology

## 2022-10-04 ENCOUNTER — Other Ambulatory Visit: Payer: Self-pay

## 2022-10-04 ENCOUNTER — Inpatient Hospital Stay: Payer: Commercial Managed Care - HMO | Attending: Hematology and Oncology | Admitting: Hematology and Oncology

## 2022-10-04 VITALS — BP 125/69 | HR 69 | Temp 97.9°F | Resp 18 | Ht 62.5 in | Wt 140.1 lb

## 2022-10-04 DIAGNOSIS — Z170 Estrogen receptor positive status [ER+]: Secondary | ICD-10-CM | POA: Insufficient documentation

## 2022-10-04 DIAGNOSIS — Z79810 Long term (current) use of selective estrogen receptor modulators (SERMs): Secondary | ICD-10-CM | POA: Diagnosis not present

## 2022-10-04 DIAGNOSIS — M8580 Other specified disorders of bone density and structure, unspecified site: Secondary | ICD-10-CM | POA: Insufficient documentation

## 2022-10-04 DIAGNOSIS — C50412 Malignant neoplasm of upper-outer quadrant of left female breast: Secondary | ICD-10-CM | POA: Diagnosis present

## 2022-10-04 NOTE — Progress Notes (Signed)
Patient Care Team: Shon Hale, MD as PCP - General (Family Medicine) Pershing Proud, RN as Oncology Nurse Navigator Rogelia Boga, Eileen Stanford, RN as Oncology Nurse Navigator Harriette Bouillon, MD as Consulting Physician (General Surgery) Magrinat, Valentino Hue, MD (Inactive) as Consulting Physician (Oncology) Antony Blackbird, MD as Consulting Physician (Radiation Oncology) Patton Salles, MD as Consulting Physician (Obstetrics and Gynecology)  DIAGNOSIS:  Encounter Diagnosis  Name Primary?   Malignant neoplasm of upper-outer quadrant of left breast in female, estrogen receptor positive (HCC) Yes    SUMMARY OF ONCOLOGIC HISTORY: Oncology History  Malignant neoplasm of upper-outer quadrant of left breast in female, estrogen receptor positive (HCC)  02/06/2020 Initial Diagnosis   Malignant neoplasm of upper-outer quadrant of left breast in female, estrogen receptor positive (HCC)   02/26/2020 Genetic Testing   Positive hereditary cancer genetic testing: 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  Variant of uncertain significance detected in CDK4 at p.P302L.  No other uncertain or pathogenic variants detected in other genes. The report date is February 26, 2020.    The CancerNext-Expanded gene panel offered by [facility] and includes sequencing, rearrangement, and RNA analysis for the following 77 genes: AIP, ALK, APC, ATM, AXIN2, BAP1, BARD1, BLM, BMPR1A, BRCA1, BRCA2, BRIP1, CDC73, CDH1, CDK4, CDKN1B, CDKN2A, CHEK2, CTNNA1, DICER1, FANCC, FH, FLCN, GALNT12, KIF1B, LZTR1, MAX, MEN1, MET, MLH1, MSH2, MSH3, MSH6, MUTYH, NBN, NF1, NF2, NTHL1, PALB2, PHOX2B, PMS2, POT1, PRKAR1A, PTCH1, PTEN, RAD51C, RAD51D, RB1, RECQL, RET, SDHA, SDHAF2, SDHB, SDHC, SDHD, SMAD4, SMARCA4, SMARCB1, SMARCE1, STK11, SUFU, TMEM127, TP53, TSC1, TSC2, VHL and XRCC2 (sequencing and deletion/duplication); EGFR, EGLN1, HOXB13, KIT, MITF, PDGFRA, POLD1, and POLE (sequencing only); EPCAM and GREM1  (deletion/duplication only).      CHIEF COMPLIANT: Follow-up tamoxifen   INTERVAL HISTORY: Michaela Roman is a 52 y.o with the above mention. Estrogen receptor positive breast cancer currently on tamoxifen She presents to the clinic today for a follow-up. Patient reports she has trouble with sleeping and over thinking. She does workout and exercise staying active.  ALLERGIES:  is allergic to sertraline.  MEDICATIONS:  Current Outpatient Medications  Medication Sig Dispense Refill   calcium-vitamin D (OSCAL WITH D) 500-200 MG-UNIT tablet Take 1 tablet by mouth.     Estradiol 10 MCG TABS vaginal tablet Place 1 tablet (10 mcg total) vaginally 2 (two) times a week. 24 tablet 3   gabapentin (NEURONTIN) 300 MG capsule Take 1 capsule (300 mg total) by mouth 3 (three) times daily. 90 capsule 12   Levomefolate Glucosamine (METHYL-FOLATE PO) Take by mouth.     levothyroxine (SYNTHROID) 112 MCG tablet Take 1 tablet by mouth daily before breakfast. 90 tablet 0   Magnesium Glycinate 120 MG CAPS 325 mg.     tamoxifen (NOLVADEX) 20 MG tablet TAKE 1 TABLET(20 MG) BY MOUTH DAILY 90 tablet 3   VTAMA 1 % CREA Apply topically.     No current facility-administered medications for this visit.    PHYSICAL EXAMINATION: ECOG PERFORMANCE STATUS: 1 - Symptomatic but completely ambulatory  Vitals:   10/04/22 1500  BP: 125/69  Pulse: 69  Resp: 18  Temp: 97.9 F (36.6 C)  SpO2: 99%   Filed Weights   10/04/22 1500  Weight: 140 lb 1.6 oz (63.5 kg)    BREAST: No palpable masses or nodules in either right or left breasts. No palpable axillary supraclavicular or infraclavicular adenopathy no breast tenderness or nipple discharge. (exam performed in the presence of a chaperone)  LABORATORY  DATA:  I have reviewed the data as listed    Latest Ref Rng & Units 10/01/2021    3:01 PM 03/07/2020   10:50 AM 02/13/2020    8:21 AM  CMP  Glucose 70 - 99 mg/dL 324  99  401   BUN 6 - 20 mg/dL 15  15  15     Creatinine 0.44 - 1.00 mg/dL 0.27  2.53  6.64   Sodium 135 - 145 mmol/L 140  141  143   Potassium 3.5 - 5.1 mmol/L 3.9  4.4  3.7   Chloride 98 - 111 mmol/L 106  102  106   CO2 22 - 32 mmol/L 29  30  31    Calcium 8.9 - 10.3 mg/dL 8.7  9.3  9.4   Total Protein 6.5 - 8.1 g/dL 6.7  6.8  7.3   Total Bilirubin 0.3 - 1.2 mg/dL 0.4  0.6  0.8   Alkaline Phos 38 - 126 U/L 38  41  46   AST 15 - 41 U/L 18  26  20    ALT 0 - 44 U/L 14  24  21      Lab Results  Component Value Date   WBC 4.5 10/01/2021   HGB 12.5 10/01/2021   HCT 35.7 (L) 10/01/2021   MCV 89.5 10/01/2021   PLT 163 10/01/2021   NEUTROABS 2.6 10/01/2021    ASSESSMENT & PLAN:  Malignant neoplasm of upper-outer quadrant of left breast in female, estrogen receptor positive (HCC) 02/04/2020: Left breast biopsy: T1CN1 stage Ib grade 2 IDC ER/PR positive HER2 negative Ki-67 30% 03/12/2020: Left lumpectomy with targeted node dissection: T1CN1A stage Ib grade 2 IDC with negative margins 1/4 lymph nodes positive, MammaPrint: Low risk 02/26/2020: Genetics: Pathogenic variant detected in HOXB13 p.G84E.  This is primarily associated with increased risk of prostate cancer 06/03/2020: Completed adjuvant radiation   Current treatment: Tamoxifen started May 2022 Tamoxifen toxicities: Insomnia and hot flashes have improved significantly.  Slight muscle stiffness in the arms.   She is a Regulatory affairs officer fiction (short stories)   Breast cancer surveillance: 1.  Breast exam 10/04/2022: Benign 2. Mammogram 09/14/2022 at [facility]: Benign   Bone density 06/03/2021: T score -1.5: Osteopenia: Recommend calcium vitamin D and weightbearing exercises  We discussed about [employer] testing and she will think about it and inform us. We will also set her up for a breast MRI to be done.  Return to clinic in 1 year for follow-up   Orders Placed This Encounter  Procedures   MR BREAST BILATERAL W WO CONTRAST INC CAD    Standing Status:   Future     Standing Expiration Date:   10/04/2023    Order Specific Question:   If indicated for the ordered procedure, I authorize the administration of contrast media per Radiology protocol    Answer:   Yes    Order Specific Question:   What is the patient's sedation requirement?    Answer:   No Sedation    Order Specific Question:   Does the patient have a pacemaker or implanted devices?    Answer:   No    Order Specific Question:   Preferred imaging location?    Answer:   [facility] (table limit-550lbs)    Order Specific Question:   Release to patient    Answer:   Immediate   The patient has a good understanding of the overall plan. she agrees with it. she will call with any problems  that may develop before the next visit here. Total time spent: 30 mins including face to face time and time spent for planning, charting and co-ordination of care   Tamsen Meek, MD 10/04/22    I Janan Ridge am acting as a Neurosurgeon for [employer]  I have reviewed the above documentation for accuracy and completeness, and I agree with the above.

## 2022-10-04 NOTE — Assessment & Plan Note (Signed)
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  This is primarily associated with increased risk of prostate cancer 06/03/2020: Completed adjuvant radiation   Current treatment: Tamoxifen started May 2022 Tamoxifen toxicities: Insomnia and hot flashes have improved significantly.  Slight muscle stiffness in the arms.   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   Breast cancer surveillance: 1.  Breast exam 10/04/2022: Benign 2. Mammogram 09/14/2022 at [facility]: Benign   Bone density 06/03/2021: T score -1.5: Osteopenia: Recommend calcium vitamin D and weightbearing exercises   Return to clinic in 1 year for follow-up

## 2022-10-19 NOTE — Progress Notes (Deleted)
52 y.o. G23P0000 Married Caucasian female here for annual exam.    PCP:     Patient's last menstrual period was 08/07/2015 (approximate).           Sexually active: {yes no:314532}  The current method of family planning is post menopausal status.    Exercising: {yes no:314532}  {types:19826} Smoker:  no  Health Maintenance: Pap:  10/27/21 neg: HR HPV neg, 06-24-17 Neg:Neg HR HPV   History of abnormal Pap:  no MMG:  09/11/21 BI-RADS CAT 2 benign Colonoscopy:  12/15/21 BMD:   06/03/21  Result  osteopenia TDaP:  08/22/15 Gardasil:   no HIV: neg in past Hep C: n/a Screening Labs:  Hb today: ***, Urine today: ***   reports that she has never smoked. She has never used smokeless tobacco. She reports current alcohol use of about 1.0 standard drink of alcohol per week. She reports that she does not use drugs.  Past Medical History:  Diagnosis Date   Anxiety    Arthritis    OP   Breast cancer (HCC)    Depression    Family history of breast cancer 02/13/2020   History of radiation therapy 04/17/20-06/03/20   IMRT to Left breast Dr. 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   Personal history of radiation therapy    Premature ovarian failure    Dx'd age 35   Thyroid disease    hypothyroid    Past Surgical History:  Procedure Laterality Date   BREAST BIOPSY Left 02/04/2020   breast and lymphnode   BREAST LUMPECTOMY Left 03/12/2020   BREAST LUMPECTOMY WITH RADIOACTIVE SEED AND SENTINEL LYMPH NODE BIOPSY Left 03/12/2020   Procedure: LEFT BREAST LUMPECTOMY WITH RADIOACTIVE SEED AND LEFT TARGETED RADIOACTIVE SEED LYMPH NODE BIOPSY AND LEFT SENTINEL LYMPH NODE MAPPING;  Surgeon: Harriette Bouillon, MD;  Location: [facility];  Service: General;  Laterality: Left;   DILATATION & CURETTAGE/HYSTEROSCOPY WITH MYOSURE N/A 10/07/2015   Procedure: DILATATION & CURETTAGE/HYSTEROSCOPY WITH MYOSURE;  Surgeon: Patton Salles, MD;  Location: [facility];   Service: Gynecology;  Laterality: N/A;  endometrial polyp   SCAR REVISION Left 02/05/2021   Procedure: left breast SCAR REVISION;  Surgeon: Harriette Bouillon, MD;  Location: [facility];  Service: General;  Laterality: Left;  61   WISDOM TOOTH EXTRACTION      Current Outpatient Medications  Medication Sig Dispense Refill   calcium-vitamin D (OSCAL WITH D) 500-200 MG-UNIT tablet Take 1 tablet by mouth.     Estradiol 10 MCG TABS vaginal tablet Place 1 tablet (10 mcg total) vaginally 2 (two) times a week. 24 tablet 3   gabapentin (NEURONTIN) 300 MG capsule Take 1 capsule (300 mg total) by mouth 3 (three) times daily. 90 capsule 12   Levomefolate Glucosamine (METHYL-FOLATE PO) Take by mouth.     levothyroxine (SYNTHROID) 112 MCG tablet Take 1 tablet by mouth daily before breakfast. 90 tablet 0   Magnesium Glycinate 120 MG CAPS 325 mg.     tamoxifen (NOLVADEX) 20 MG tablet TAKE 1 TABLET(20 MG) BY MOUTH DAILY 90 tablet 3   VTAMA 1 % CREA Apply topically.     No current facility-administered medications for this visit.    Family History  Problem Relation Age of Onset   Cancer Mother 55       endometrial cancer   Depression Mother    Multiple sclerosis Father    Other  Father 20       right leg amputated below knee   Thyroid disease Sister        hypothyroidism   Hypertension Maternal Grandmother    Heart disease Maternal Grandmother    Depression Maternal Grandmother    Diabetes Maternal Grandfather    Other Maternal Grandfather        benign brain tumor   Dementia Paternal Grandmother    Alcohol abuse Paternal Grandfather    Prostate cancer Cousin 21       maternal cousin   Colon cancer Other        MGF's brother; dx 62s   Colon polyps Neg Hx    Esophageal cancer Neg Hx    Rectal cancer Neg Hx    Stomach cancer Neg Hx     Review of Systems  Exam:   LMP 08/07/2015 (Approximate)     General appearance: alert, cooperative and appears stated age Head:  normocephalic, without obvious abnormality, atraumatic Neck: no adenopathy, supple, symmetrical, trachea midline and thyroid normal to inspection and palpation Lungs: clear to auscultation bilaterally Breasts: normal appearance, no masses or tenderness, No nipple retraction or dimpling, No nipple discharge or bleeding, No axillary adenopathy Heart: regular rate and rhythm Abdomen: soft, non-tender; no masses, no organomegaly Extremities: extremities normal, atraumatic, no cyanosis or edema Skin: skin color, texture, turgor normal. No rashes or lesions Lymph nodes: cervical, supraclavicular, and axillary nodes normal. Neurologic: grossly normal  Pelvic: External genitalia:  no lesions              No abnormal inguinal nodes palpated.              Urethra:  normal appearing urethra with no masses, tenderness or lesions              Bartholins and Skenes: normal                 Vagina: normal appearing vagina with normal color and discharge, no lesions              Cervix: no lesions              Pap taken: {yes no:314532} Bimanual Exam:  Uterus:  normal size, contour, position, consistency, mobility, non-tender              Adnexa: no mass, fullness, tenderness              Rectal exam: {yes no:314532}.  Confirms.              Anus:  normal sphincter tone, no lesions  Chaperone was present for exam:  ***  Assessment:   Well woman visit with gynecologic exam.   Plan: Mammogram screening discussed. Self breast awareness reviewed. Pap and HR HPV as above. Guidelines for Calcium, Vitamin D, regular exercise program including cardiovascular and weight bearing exercise.   Follow up annually and prn.   Additional counseling given.  {yes [id number]. _______ minutes face to face time of which over 50% was spent in counseling.    After visit summary provided.

## 2022-11-02 ENCOUNTER — Ambulatory Visit: Payer: 59 | Admitting: Obstetrics and Gynecology

## 2022-11-25 ENCOUNTER — Encounter: Payer: Self-pay | Admitting: Obstetrics and Gynecology

## 2022-11-25 ENCOUNTER — Encounter: Payer: Self-pay | Admitting: Hematology and Oncology

## 2022-11-29 ENCOUNTER — Other Ambulatory Visit: Payer: Self-pay | Admitting: Hematology and Oncology

## 2022-12-06 ENCOUNTER — Encounter: Payer: Self-pay | Admitting: Hematology and Oncology

## 2022-12-07 ENCOUNTER — Other Ambulatory Visit: Payer: Self-pay | Admitting: *Deleted

## 2022-12-07 DIAGNOSIS — C50412 Malignant neoplasm of upper-outer quadrant of left female breast: Secondary | ICD-10-CM

## 2022-12-07 DIAGNOSIS — Z170 Estrogen receptor positive status [ER+]: Secondary | ICD-10-CM

## 2022-12-07 NOTE — Progress Notes (Signed)
Pt requesting Signatera testing.  RN sucessfuly faxed orders to [phone].

## 2022-12-13 ENCOUNTER — Encounter: Payer: Self-pay | Admitting: Hematology and Oncology

## 2022-12-14 ENCOUNTER — Other Ambulatory Visit: Payer: Self-pay | Admitting: *Deleted

## 2022-12-14 MED ORDER — GABAPENTIN 300 MG PO CAPS: 300.0000 mg | ORAL_CAPSULE | Freq: Three times a day (TID) | ORAL | 12 refills | Status: AC

## 2022-12-21 ENCOUNTER — Telehealth (INDEPENDENT_AMBULATORY_CARE_PROVIDER_SITE_OTHER): Payer: 59 | Admitting: Behavioral Health

## 2022-12-21 ENCOUNTER — Encounter: Payer: Self-pay | Admitting: Behavioral Health

## 2022-12-21 DIAGNOSIS — F99 Mental disorder, not otherwise specified: Secondary | ICD-10-CM

## 2022-12-21 DIAGNOSIS — F5105 Insomnia due to other mental disorder: Secondary | ICD-10-CM

## 2022-12-21 DIAGNOSIS — F3342 Major depressive disorder, recurrent, in full remission: Secondary | ICD-10-CM | POA: Diagnosis not present

## 2022-12-21 DIAGNOSIS — F411 Generalized anxiety disorder: Secondary | ICD-10-CM

## 2022-12-21 MED ORDER — HYDROXYZINE HCL 25 MG PO TABS: 25.0000 mg | ORAL_TABLET | Freq: Three times a day (TID) | ORAL | 1 refills | Status: DC | PRN

## 2022-12-21 NOTE — Progress Notes (Signed)
Michaela Roman [id number] Apr 08, 1970 52 y.o.  Virtual Visit via Video Note  I connected with pt @ on 12/21/22 at  1:00 PM EDT by a video enabled telemedicine application and verified that I am speaking with the correct person using two identifiers.   I discussed the limitations of evaluation and management by telemedicine and the availability of in person appointments. The patient expressed understanding and agreed to proceed.  I discussed the assessment and treatment plan with the patient. The patient was provided an opportunity to ask questions and all were answered. The patient agreed with the plan and demonstrated an understanding of the instructions.   The patient was advised to call back or seek an in-person evaluation if the symptoms worsen or if the condition fails to improve as anticipated.  I provided 30 minutes of non-face-to-face time during this encounter.  The patient was located at home.  The provider was located at [facility].   Joan Flores, NP   Subjective:   Patient ID:  Michaela Roman is a 52 y.o. (DOB [birth year]) female.  Chief Complaint:  Chief Complaint  Patient presents with   Anxiety   Depression   Follow-up   Patient Education   Insomnia    HPI 52  year old female presents to this office via video visit for  follow up and medication management. She has been off all psychiatric medication since last visit. She has questions today about continued problems with Insomnia. She says that she is aware that 10% of patients on Tamoxifen have sleeping problems. Reports that her insomnia is intermittent.  She spoke to her PCP about the problems but was waiting for this visit. She said she is interested in trying an antianxiety medication for sleep.   No social changes. Report anxiety 6/10 but says she is worried about the election. Depression is 2/10.  She is getting about 5-6 hours of sleep per night.  No mania, no psychosis. No SI/HI.    Wellbutrin- Insomnia, racing thoughts Stopped after 2 weeks.   Effexor-pt said was not controlling depression Zoloft- Took for a few weeks. Had insomnia. Developed hives.  Lexapro Lorazepam- Had a calming effect. Not effective with decreasing middle of the night awakenings. Melatonin- Vivid dreams Trazodone- Felt sedated but did not sleep.  Lunesta 1mg - said was not effective     Review of Systems:  Review of Systems  Medications: I have reviewed the patient's current medications.  Current Outpatient Medications  Medication Sig Dispense Refill   hydrOXYzine (ATARAX) 25 MG tablet Take 1 tablet (25 mg total) by mouth 3 (three) times daily as needed. 90 tablet 1   calcium-vitamin D (OSCAL WITH D) 500-200 MG-UNIT tablet Take 1 tablet by mouth.     Estradiol 10 MCG TABS vaginal tablet Place 1 tablet (10 mcg total) vaginally 2 (two) times a week. 24 tablet 3   gabapentin (NEURONTIN) 300 MG capsule Take 1 capsule (300 mg total) by mouth 3 (three) times daily. 90 capsule 12   Levomefolate Glucosamine (METHYL-FOLATE PO) Take by mouth.     levothyroxine (SYNTHROID) 112 MCG tablet Take 1 tablet by mouth daily before breakfast. 90 tablet 0   Magnesium Glycinate 120 MG CAPS 325 mg.     tamoxifen (NOLVADEX) 20 MG tablet TAKE 1 TABLET(20 MG) BY MOUTH DAILY 90 tablet 3   VTAMA 1 % CREA Apply topically.     No current facility-administered medications for this visit.    Medication Side Effects: None  Allergies:  Allergies  Allergen Reactions   Sertraline Hives    Past Medical History:  Diagnosis Date   Anxiety    Arthritis    OP   Breast cancer (HCC)    Depression    Family history of breast cancer 02/13/2020   History of radiation therapy 04/17/20-06/03/20   IMRT to Left breast Dr. 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   Personal history of radiation therapy    Premature ovarian failure    Dx'd age 81   Thyroid disease     hypothyroid    Family History  Problem Relation Age of Onset   Cancer Mother 11       endometrial cancer   Depression Mother    Multiple sclerosis Father    Other Father 52       right leg amputated below knee   Thyroid disease Sister        hypothyroidism   Hypertension Maternal Grandmother    Heart disease Maternal Grandmother    Depression Maternal Grandmother    Diabetes Maternal Grandfather    Other Maternal Grandfather        benign brain tumor   Dementia Paternal Grandmother    Alcohol abuse Paternal Grandfather    Prostate cancer Cousin 21       maternal cousin   Colon cancer Other        MGF's brother; dx 53s   Colon polyps Neg Hx    Esophageal cancer Neg Hx    Rectal cancer Neg Hx    Stomach cancer Neg Hx     Social History   Socioeconomic History   Marital status: Married    Spouse name: Not on file   Number of children: Not on file   Years of education: Not on file   Highest education level: Not on file  Occupational History   Not on file  Tobacco Use   Smoking status: Never   Smokeless tobacco: Never  Vaping Use   Vaping status: Never Used  Substance and Sexual Activity   Alcohol use: Yes    Alcohol/week: 1.0 standard drink of alcohol    Types: 1 Glasses of wine per week    Comment: one drink a week   Drug use: No   Sexual activity: Yes    Partners: Female, Female    Birth control/protection: Post-menopausal, None  Other Topics Concern   Not on file  Social History Narrative   Not on file   Social Determinants of Health   Financial Resource Strain: Not on file  Food Insecurity: Not on file  Transportation Needs: Not on file  Physical Activity: Not on file  Stress: Not on file  Social Connections: Unknown (08/23/2022)   [facility]   Social Network    Social Network: Not on file  Intimate Partner Violence: Unknown (08/23/2022)   Received from [facility]   HITS    Physically Hurt: Not on file    Insult or Talk Down  To: Not on file    Threaten Physical Harm: Not on file    Scream or Curse: Not on file    Past Medical History, Surgical history, Social history, and Family history were reviewed and updated as appropriate.   Please see review of systems for further details on the patient's review from today.   Objective:   Physical Exam:  LMP 08/07/2015 (Approximate)   Physical  Exam  Lab Review:     Component Value Date/Time   NA 140 10/01/2021 1501   NA 140 10/17/2018 0922   K 3.9 10/01/2021 1501   CL 106 10/01/2021 1501   CO2 29 10/01/2021 1501   GLUCOSE 101 (H) 10/01/2021 1501   BUN 15 10/01/2021 1501   BUN 12 10/17/2018 0922   CREATININE 1.05 (H) 10/01/2021 1501   CREATININE 0.95 08/22/2015 1653   CALCIUM 8.7 (L) 10/01/2021 1501   PROT 6.7 10/01/2021 1501   PROT 6.5 10/17/2018 0922   ALBUMIN 4.3 10/01/2021 1501   ALBUMIN 4.5 10/17/2018 0922   AST 18 10/01/2021 1501   ALT 14 10/01/2021 1501   ALKPHOS 38 10/01/2021 1501   BILITOT 0.4 10/01/2021 1501   GFRNONAA >60 10/01/2021 1501   GFRAA 81 10/17/2018 0922       Component Value Date/Time   WBC 4.5 10/01/2021 1501   WBC 5.3 03/07/2020 1050   RBC 3.99 10/01/2021 1501   HGB 12.5 10/01/2021 1501   HGB 13.5 10/17/2018 0922   HCT 35.7 (L) 10/01/2021 1501   HCT 40.9 10/17/2018 0922   PLT 163 10/01/2021 1501   PLT 188 10/17/2018 0922   MCV 89.5 10/01/2021 1501   MCV 93 10/17/2018 0922   MCH 31.3 10/01/2021 1501   MCHC 35.0 10/01/2021 1501   RDW 12.2 10/01/2021 1501   RDW 13.1 10/17/2018 0922   LYMPHSABS 1.4 10/01/2021 1501   MONOABS 0.4 10/01/2021 1501   EOSABS 0.1 10/01/2021 1501   BASOSABS 0.0 10/01/2021 1501    No results found for: "POCLITH", "LITHIUM"   No results found for: "PHENYTOIN", "PHENOBARB", "VALPROATE", "CBMZ"   .res Assessment: Plan:    Recommendations   Greater than 50% of 30  min video visit  time with patient was spent on counseling and coordination of care.  We discussed her stability with  anxiety and depression. She is concerned about poor sleep this visit. Requesting trial of new medication to help.  Will start hydroxyzine 25 mg three times daily for anxiety and sleep at bedtime. May take 50 mg at bedtime if 25 mg not effective.  To report and side effects or worsening symptoms To follow up in 4 weeks to reassess Provided emergency contact information Reviewed PDMP    Inaayah was seen today for anxiety, depression, follow-up, patient education and insomnia.  Diagnoses and all orders for this visit:  Insomnia due to other mental disorder -     hydrOXYzine (ATARAX) 25 MG tablet; Take 1 tablet (25 mg total) by mouth 3 (three) times daily as needed.  Recurrent major depressive disorder, in full remission (HCC)  Generalized anxiety disorder -     hydrOXYzine (ATARAX) 25 MG tablet; Take 1 tablet (25 mg total) by mouth 3 (three) times daily as needed.     Please see After Visit Summary for patient specific instructions.  Future Appointments  Date Time Provider Department Center  12/22/2022  4:15 PM Barbaraann Faster, PT [facility] None  01/11/2023  8:20 AM [facility] MR 1 [facility] [facility]  01/24/2023  8:30 AM Berna Spare A, PTA [facility] None  03/16/2023  8:00 AM Patton Salles, MD [facility] None  03/17/2023 11:45 AM Barbaraann Faster, PT [facility] None  03/24/2023 11:45 AM Barbaraann Faster, PT [facility] None  03/31/2023 11:45 AM Barbaraann Faster, PT [facility] None  04/07/2023 11:45 AM Barbaraann Faster, PT [facility] None  10/04/2023  2:45 PM Causey, Larna Daughters, NP [facility] None  No orders of the defined types were placed in this encounter.     -------------------------------

## 2022-12-22 ENCOUNTER — Other Ambulatory Visit: Payer: Self-pay

## 2022-12-22 ENCOUNTER — Ambulatory Visit: Payer: Commercial Managed Care - HMO | Attending: Family Medicine | Admitting: Physical Therapy

## 2022-12-22 DIAGNOSIS — R293 Abnormal posture: Secondary | ICD-10-CM

## 2022-12-22 DIAGNOSIS — M6281 Muscle weakness (generalized): Secondary | ICD-10-CM

## 2022-12-22 DIAGNOSIS — R279 Unspecified lack of coordination: Secondary | ICD-10-CM | POA: Diagnosis present

## 2022-12-22 NOTE — Therapy (Signed)
OUTPATIENT PHYSICAL THERAPY FEMALE PELVIC EVALUATION   Patient Name: Michaela Roman MRN: [id number] DOB:23-Oct-1970, 52 y.o., female Today's Date: 12/22/2022  END OF SESSION:  PT End of Session - 12/22/22 1607     Visit Number 1    Date for PT Re-Evaluation 04/24/23    Authorization Type [employer]    PT Start Time 1607    PT Stop Time 1645    PT Time Calculation (min) 38 min             Past Medical History:  Diagnosis Date   Anxiety    Arthritis    OP   Breast cancer (HCC)    Depression    Family history of breast cancer 02/13/2020   History of radiation therapy 04/17/20-06/03/20   IMRT to Left breast Dr. 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   Personal history of radiation therapy    Premature ovarian failure    Dx'd age 31   Thyroid disease    hypothyroid   Past Surgical History:  Procedure Laterality Date   BREAST BIOPSY Left 02/04/2020   breast and lymphnode   BREAST LUMPECTOMY Left 03/12/2020   BREAST LUMPECTOMY WITH RADIOACTIVE SEED AND SENTINEL LYMPH NODE BIOPSY Left 03/12/2020   Procedure: LEFT BREAST LUMPECTOMY WITH RADIOACTIVE SEED AND LEFT TARGETED RADIOACTIVE SEED LYMPH NODE BIOPSY AND LEFT SENTINEL LYMPH NODE MAPPING;  Surgeon: Harriette Bouillon, MD;  Location: [facility];  Service: General;  Laterality: Left;   DILATATION & CURETTAGE/HYSTEROSCOPY WITH MYOSURE N/A 10/07/2015   Procedure: DILATATION & CURETTAGE/HYSTEROSCOPY WITH MYOSURE;  Surgeon: Patton Salles, MD;  Location: [facility];  Service: Gynecology;  Laterality: N/A;  endometrial polyp   SCAR REVISION Left 02/05/2021   Procedure: left breast SCAR REVISION;  Surgeon: Harriette Bouillon, MD;  Location: [facility];  Service: General;  Laterality: Left;  60   WISDOM TOOTH EXTRACTION     Patient Active Problem List   Diagnosis Date Noted   Bilateral carpal tunnel syndrome 07/22/2020   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    Genetic testing 02/21/2020   Family history of prostate cancer 02/13/2020   Family history of breast cancer 02/13/2020   Family history of colon cancer 02/13/2020   Malignant neoplasm of upper-outer quadrant of left breast in female, estrogen receptor positive (HCC) 02/06/2020    PCP: Shon Hale, MD  REFERRING PROVIDER: Shon Hale, MD   REFERRING DIAG: R32 (ICD-10-CM) - Unspecified urinary incontinence  THERAPY DIAG:  Muscle weakness (generalized) - Plan: PT plan of care cert/re-cert  Abnormal posture - Plan: PT plan of care cert/re-cert  Unspecified lack of coordination - Plan: PT plan of care cert/re-cert  Rationale for Evaluation and Treatment: Rehabilitation  ONSET DATE: 2 months ago  SUBJECTIVE:  SUBJECTIVE STATEMENT: About once a week, I have been waking at night with damp underwear from urinary leakage. Laughing, coughing having some urinary dripping. Has tried decreased gabapentin and this has helped. Does take an Estradiol tablet.    Fluid intake: Yes: water - 64-70 oz; coffee in the morning, herbal tea in the evening sometimes.     PAIN:  Are you having pain? No   PRECAUTIONS: Other: history of breast cancer  RED FLAGS: None   WEIGHT BEARING RESTRICTIONS: No  FALLS:  Has patient fallen in last 6 months? No  LIVING ENVIRONMENT: Lives with: lives with their family Lives in: House/apartment   OCCUPATION: Clinical research associate   PLOF: Independent  PATIENT GOALS: to do kegels correctly and have stronger pelvic floor   PERTINENT HISTORY:  breast cancer - 03/2020, on tamoxifen, radiation, breast CA,  Depression,  Sexual abuse: No  BOWEL MOVEMENT: Pain with bowel movement: No Type of bowel movement:Type (Bristol Stool Scale) 4, Frequency daily, and Strain No Fully  empty rectum: Yes:   Leakage: No Pads: No Fiber supplement: No  URINATION: Pain with urination: No Fully empty bladder: Yes:   Stream: Strong Urgency: No Frequency: not quicker than every 2 hours, wakes 1x for urine but sometimes is just awake and then urinates Leakage: Coughing and Laughing Pads: Yes: ultra thin liners, just sometimes  INTERCOURSE: Pain with intercourse: Initial Penetration Ability to have vaginal penetration:  Yes: dryness Climax: not painful  Marinoff Scale: 0/3  PREGNANCY: Vaginal deliveries 0 Tearing No C-section deliveries 0 Currently pregnant No  PROLAPSE: Sometimes vaginally with waiting to urinate and has very full bladder   OBJECTIVE:  Note: Objective measures were completed at Evaluation unless otherwise noted.  DIAGNOSTIC FINDINGS:   COGNITION: Overall cognitive status: Within functional limits for tasks assessed     SENSATION: Light touch: Appears intact Proprioception: Appears intact  MUSCLE LENGTH: Bil hamstrings and adductors limited by 25%  LUMBAR SPECIAL TESTS:  WFL  FUNCTIONAL TESTS:  WFL  GAIT: WFL  POSTURE: rounded shoulders  PELVIC ALIGNMENT:WFL  LUMBARAROM/PROM:  A/PROM A/PROM  eval  Flexion WFL  Extension   Right lateral flexion WFL  Left lateral flexion WFL  Right rotation Limited by 25%  Left rotation Limited by 25%   (Blank rows = not tested)  LOWER EXTREMITY ROM:  WFL  LOWER EXTREMITY MMT:  Bil hips grossly 4+/5; knees 5/5  PALPATION:   General  no TTP, mild tightness in lumbar spine                External Perineal Exam WFL, mild clitoral hood adhesion with mild dryness                             Internal Pelvic Floor mild TTP with downward palpation at vaginal opening at 4-6 on clock face  Patient confirms identification and approves PT to assess internal pelvic floor and treatment Yes No emotional/communication barriers or cognitive limitation. Patient is motivated to learn. Patient  understands and agrees with treatment goals and plan. PT explains patient will be examined in standing, sitting, and lying down to see how their muscles and joints work. When they are ready, they will be asked to remove their underwear so PT can examine their perineum. The patient is also given the option of providing their own chaperone as one is not provided in our facility. The patient also has the right and is explained the right to defer or  refuse any part of the evaluation or treatment including the internal exam. With the patient's consent, PT will use one gloved finger to gently assess the muscles of the pelvic floor, seeing how well it contracts and relaxes and if there is muscle symmetry. After, the patient will get dressed and PT and patient will discuss exam findings and plan of care. PT and patient discuss plan of care, schedule, attendance policy and HEP activities.  PELVIC MMT:   MMT eval  Vaginal 4/5 with cues for technique, initially 3/5 but improved; 10s; 7 reps  Internal Anal Sphincter   External Anal Sphincter   Puborectalis   Diastasis Recti   (Blank rows = not tested)        TONE:  WFL  PROLAPSE: Not seen in hooklying   TODAY'S TREATMENT:                                                                                                                              DATE:   12/22/22 EVAL Examination completed, findings reviewed, pt educated on POC, HEP. Pt motivated to participate in PT and agreeable to attempt recommendations.     PATIENT EDUCATION:  Education details: [id number] Person educated: Patient Education method: Programmer, multimedia, Demonstration, Tactile cues, Verbal cues, and Handouts Education comprehension: verbalized understanding and returned demonstration  HOME EXERCISE PROGRAM: [id number]  ASSESSMENT:  CLINICAL IMPRESSION: Patient is a 52 y.o. female  who was seen today for physical therapy evaluation and treatment for urinary incontinence with stressors of  coughing and laughing and sometimes wakes with bedwetting minimal at night. Pt states she sometimes wakes around 3am and is awake and just goes to the bathroom. Pt states she does sometimes wake with urge. Pt also reports pain with vaginal penetration but has been there for years. Pt also lifts 50-75# with workouts for legs and weight trains several times a week without leakage. Pt found to have mild decreased flexibility in spine and hips. Patient consented to internal pelvic floor assessment vaginally this date and found to have decreased strength, endurance, and coordination. Patient benefited from verbal cues for improved technique with pelvic floor contractions and breathing mechanics. Pt also educated on coordination of pelvic floor with exhale and with push/pull/lift with workouts. Pt verbalized understanding. Pt would benefit from additional PT to further address deficits.    OBJECTIVE IMPAIRMENTS: decreased coordination, decreased strength, increased fascial restrictions, impaired flexibility, improper body mechanics, postural dysfunction, and pain.   ACTIVITY LIMITATIONS: continence  PARTICIPATION LIMITATIONS: interpersonal relationship and community activity  PERSONAL FACTORS: Time since onset of injury/illness/exacerbation and 1 comorbidity: breast cancer history with hormone (+)  are also affecting patient's functional outcome.   REHAB POTENTIAL: Good  CLINICAL DECISION MAKING: Stable/uncomplicated  EVALUATION COMPLEXITY: Low   GOALS: Goals reviewed with patient? Yes  SHORT TERM GOALS: Target date: 01/19/23  Pt to be I with HEP.  Baseline: Goal status: INITIAL  2.  Pt to demonstrate improved coordination of pelvic  floor and breathing mechanics with 20# squat with appropriate synergistic patterns to decrease pain and leakage at least 75% of the time.    Baseline:  Goal status: INITIAL  3.  Pt to be I with urge drill and bladder irritants to decreased urinary leakage.   Baseline:  Goal status: INITIAL   LONG TERM GOALS: Target date: 04/24/23  Pt to be I with advanced HEP.  Baseline:  Goal status: INITIAL  2.  Pt to demonstrate improved coordination of pelvic floor and breathing mechanics with 30# squat with appropriate synergistic patterns to decrease pain and leakage at least 75% of the time.  Baseline:  Goal status: INITIAL  3.  Pt to demonstrate at least 4/5 pelvic floor strength for improved pelvic stability and decreased strain at pelvic floor/ decrease leakage.  Baseline:  Goal status: INITIAL  4.  Pt to report no more than one urinary leakage instance in a week for decreased symptoms of leakage and improved QOL. Baseline:  Goal status: INITIAL    PLAN:  PT FREQUENCY: 1x/week  PT DURATION:  4 sessions  PLANNED INTERVENTIONS: 97110-Therapeutic exercises, 97530- Therapeutic activity, 97112- Neuromuscular re-education, 97535- Self Care, 25366- Manual therapy, Patient/Family education, Taping, Dry Needling, Joint mobilization, Spinal mobilization, Scar mobilization, Cryotherapy, Moist heat, and Biofeedback  PLAN FOR NEXT SESSION: internal if needed and pt consents, breathing mechanics, voiding mechanics, urge drill, coordination of pelvic floor and breathing, pelvic floor strengthening, hip and core strengthening   Otelia Sergeant, PT, DPT 10/23/244:59 PM

## 2022-12-28 ENCOUNTER — Ambulatory Visit: Payer: Commercial Managed Care - HMO | Admitting: Physical Therapy

## 2022-12-28 ENCOUNTER — Encounter: Payer: Self-pay | Admitting: Physical Therapy

## 2022-12-28 DIAGNOSIS — R279 Unspecified lack of coordination: Secondary | ICD-10-CM

## 2022-12-28 DIAGNOSIS — M6281 Muscle weakness (generalized): Secondary | ICD-10-CM

## 2022-12-28 DIAGNOSIS — R293 Abnormal posture: Secondary | ICD-10-CM

## 2022-12-28 NOTE — Patient Instructions (Addendum)
Urge Incontinence  Ideal urination frequency is every 2-4 wakeful hours, which equates to 5-8 times within a 24-hour period.   Urge incontinence is leakage that occurs when the bladder muscle contracts, creating a sudden need to go before getting to the bathroom.   Going too often when your bladder isn't actually full can disrupt the body's automatic signals to store and hold urine longer, which will increase urgency/frequency.  In this case, the bladder "is running the show" and strategies can be learned to retrain this pattern.   One should be able to control the first urge to urinate, at around 150mL.  The bladder can hold up to a "grande latte," or 400mL. To help you gain control, practice the Urge Drill below when urgency strikes.  This drill will help retrain your bladder signals and allow you to store and hold urine longer.  The overall goal is to stretch out your time between voids to reach a more manageable voiding schedule.    Practice your "quick flicks" often throughout the day (each waking hour) even when you don't need feel the urge to go.  This will help strengthen your pelvic floor muscles, making them more effective in controlling leakage.  Urge Drill  When you feel an urge to go, follow these steps to regain control: Stop what you are doing and be still Take one deep breath, directing your air into your abdomen Think an affirming thought, such as "I've got this." Do 5 quick flicks of your pelvic floor Walk with control to the bathroom to void, or delay voiding   Bladder Irritants  Certain foods and beverages can be irritating to the bladder.  Avoiding these irritants may decrease your symptoms of urinary urgency, frequency or bladder pain.  Even reducing your intake can help with your symptoms.  Not everyone is sensitive to all bladder irritants, so you may consider focusing on one irritant at a time, removing or reducing your intake of that irritant for 7-10 days to see if  this change helps your symptoms.  Water intake is also very important.  Below is a list of bladder irritants.  Drinks: alcohol, carbonated beverages, caffeinated beverages such as coffee and tea, drinks with artificial sweeteners, citrus juices, apple juice, tomato juice  Foods: tomatoes and tomato based foods, spicy food, sugar and artificial sweeteners, vinegar, chocolate, raw onion, apples, citrus fruits, pineapple, cranberries, tomatoes, strawberries, plums, peaches, cantaloupe  Other: acidic urine (too concentrated) - see water intake info below  Substitutes you can try that are NOT irritating to the bladder: cooked onion, pears, papayas, sun-brewed decaf teas, watermelons, non-citrus herbal teas, apricots, kava and low-acid instant drinks (Postum).    WATER INTAKE: Remember to drink lots of water (aim for fluid intake of half your body weight with 2/3 of fluids being water).  You may be limiting fluids due to fear of leakage, but this can actually worsen urgency symptoms due to highly concentrated urine.  Water helps balance the pH of your urine so it doesn't become too acidic - acidic urine is a bladder irritant!    

## 2022-12-28 NOTE — Therapy (Signed)
OUTPATIENT PHYSICAL THERAPY FEMALE PELVIC TREATMENT   Patient Name: LORISSA GUADERRAMA MRN: [id number] DOB:[birth year], 52 y.o., female Today's Date: 12/28/2022  END OF SESSION:  PT End of Session - 12/28/22 1235     Visit Number 2    Date for PT Re-Evaluation 04/24/23    Authorization Type [employer]    PT Start Time 1233    PT Stop Time 1312    PT Time Calculation (min) 39 min             Past Medical History:  Diagnosis Date   Anxiety    Arthritis    OP   Breast cancer (HCC)    Depression    Family history of breast cancer 02/13/2020   History of radiation therapy 04/17/20-06/03/20   IMRT to Left breast Dr. 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   Personal history of radiation therapy    Premature ovarian failure    Dx'd age 71   Thyroid disease    hypothyroid   Past Surgical History:  Procedure Laterality Date   BREAST BIOPSY Left 02/04/2020   breast and lymphnode   BREAST LUMPECTOMY Left 03/12/2020   BREAST LUMPECTOMY WITH RADIOACTIVE SEED AND SENTINEL LYMPH NODE BIOPSY Left 03/12/2020   Procedure: LEFT BREAST LUMPECTOMY WITH RADIOACTIVE SEED AND LEFT TARGETED RADIOACTIVE SEED LYMPH NODE BIOPSY AND LEFT SENTINEL LYMPH NODE MAPPING;  Surgeon: Harriette Bouillon, MD;  Location: [facility];  Service: General;  Laterality: Left;   DILATATION & CURETTAGE/HYSTEROSCOPY WITH MYOSURE N/A 10/07/2015   Procedure: DILATATION & CURETTAGE/HYSTEROSCOPY WITH MYOSURE;  Surgeon: Patton Salles, MD;  Location: [facility];  Service: Gynecology;  Laterality: N/A;  endometrial polyp   SCAR REVISION Left 02/05/2021   Procedure: left breast SCAR REVISION;  Surgeon: Harriette Bouillon, MD;  Location: [facility];  Service: General;  Laterality: Left;  60   WISDOM TOOTH EXTRACTION     Patient Active Problem List   Diagnosis Date Noted   Bilateral carpal tunnel syndrome 07/22/2020   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   Genetic  testing 02/21/2020   Family history of prostate cancer 02/13/2020   Family history of breast cancer 02/13/2020   Family history of colon cancer 02/13/2020   Malignant neoplasm of upper-outer quadrant of left breast in female, estrogen receptor positive (HCC) 02/06/2020    PCP: Shon Hale, MD  REFERRING PROVIDER: Shon Hale, MD   REFERRING DIAG: R32 (ICD-10-CM) - Unspecified urinary incontinence  THERAPY DIAG:  Muscle weakness (generalized)  Abnormal posture  Unspecified lack of coordination  Rationale for Evaluation and Treatment: Rehabilitation  ONSET DATE: 2 months ago  SUBJECTIVE:  SUBJECTIVE STATEMENT: Pt reports urine leakage Sunday night first time in 10 days and woke with wetting herself, did not wake with in urge.   Fluid intake: Yes: water - 64-70 oz; coffee in the morning, herbal tea in the evening sometimes.     PAIN:  Are you having pain? No   PRECAUTIONS: Other: history of breast cancer  RED FLAGS: None   WEIGHT BEARING RESTRICTIONS: No  FALLS:  Has patient fallen in last 6 months? No  LIVING ENVIRONMENT: Lives with: lives with their family Lives in: House/apartment   OCCUPATION: Clinical research associate   PLOF: Independent  PATIENT GOALS: to do kegels correctly and have stronger pelvic floor   PERTINENT HISTORY:  breast cancer - 03/2020, on tamoxifen, radiation, breast CA,  Depression,  Sexual abuse: No  BOWEL MOVEMENT: Pain with bowel movement: No Type of bowel movement:Type (Bristol Stool Scale) 4, Frequency daily, and Strain No Fully empty rectum: Yes:   Leakage: No Pads: No Fiber supplement: No  URINATION: Pain with urination: No Fully empty bladder: Yes:   Stream: Strong Urgency: No Frequency: not quicker than every 2 hours, wakes 1x for  urine but sometimes is just awake and then urinates Leakage: Coughing and Laughing Pads: Yes: ultra thin liners, just sometimes  INTERCOURSE: Pain with intercourse: Initial Penetration Ability to have vaginal penetration:  Yes: dryness Climax: not painful  Marinoff Scale: 0/3  PREGNANCY: Vaginal deliveries 0 Tearing No C-section deliveries 0 Currently pregnant No  PROLAPSE: Sometimes vaginally with waiting to urinate and has very full bladder   OBJECTIVE:  Note: Objective measures were completed at Evaluation unless otherwise noted.  DIAGNOSTIC FINDINGS:   COGNITION: Overall cognitive status: Within functional limits for tasks assessed     SENSATION: Light touch: Appears intact Proprioception: Appears intact  MUSCLE LENGTH: Bil hamstrings and adductors limited by 25%  LUMBAR SPECIAL TESTS:  WFL  FUNCTIONAL TESTS:  WFL  GAIT: WFL  POSTURE: rounded shoulders  PELVIC ALIGNMENT:WFL  LUMBARAROM/PROM:  A/PROM A/PROM  eval  Flexion WFL  Extension   Right lateral flexion WFL  Left lateral flexion WFL  Right rotation Limited by 25%  Left rotation Limited by 25%   (Blank rows = not tested)  LOWER EXTREMITY ROM:  WFL  LOWER EXTREMITY MMT:  Bil hips grossly 4+/5; knees 5/5  PALPATION:   General  no TTP, mild tightness in lumbar spine                External Perineal Exam WFL, mild clitoral hood adhesion with mild dryness                             Internal Pelvic Floor mild TTP with downward palpation at vaginal opening at 4-6 on clock face  Patient confirms identification and approves PT to assess internal pelvic floor and treatment Yes No emotional/communication barriers or cognitive limitation. Patient is motivated to learn. Patient understands and agrees with treatment goals and plan. PT explains patient will be examined in standing, sitting, and lying down to see how their muscles and joints work. When they are ready, they will be asked to remove  their underwear so PT can examine their perineum. The patient is also given the option of providing their own chaperone as one is not provided in our facility. The patient also has the right and is explained the right to defer or refuse any part of the evaluation or treatment including the internal exam. With the  patient's consent, PT will use one gloved finger to gently assess the muscles of the pelvic floor, seeing how well it contracts and relaxes and if there is muscle symmetry. After, the patient will get dressed and PT and patient will discuss exam findings and plan of care. PT and patient discuss plan of care, schedule, attendance policy and HEP activities.  PELVIC MMT:   MMT eval  Vaginal 4/5 with cues for technique, initially 3/5 but improved; 10s; 7 reps  Internal Anal Sphincter   External Anal Sphincter   Puborectalis   Diastasis Recti   (Blank rows = not tested)        TONE:  WFL  PROLAPSE: Not seen in hooklying   TODAY'S TREATMENT:                                                                                                                              DATE:   12/22/22 EVAL Examination completed, findings reviewed, pt educated on POC, HEP. Pt motivated to participate in PT and agreeable to attempt recommendations.    12/28/22  NMRE: all exercises for exhale and pelvic floor contraction with activity for improved coordination of muscle activation and decreased stress at pelvic floor for decreased leakage.  2x10 squats 25# Bear plank 3x30s Lateral lunge 2x10 each X10 jumps in place  Ball sitting pelvic drops 2x10 with diaphragmatic breathing 2x30s happy baby sitting on ball Pelvic tilts on pull noodle x10 each Therapeutic activity - pt educated on urge drill and handout provided.   PATIENT EDUCATION:  Education details: [id number] Person educated: Patient Education method: Explanation, Demonstration, Tactile cues, Verbal cues, and Handouts Education comprehension:  verbalized understanding and returned demonstration  HOME EXERCISE PROGRAM: [id number]  ASSESSMENT:  CLINICAL IMPRESSION: Patient presents for treatment, focus on coordination of pelvic floor and breathing mechanics with exercises to improve pt's ability to activate pelvic floor with mobility. Also ended with pelvic floor and hip mobility to decreased any tension at pelvic floor and decreased pain with vaginal penetration. Pt would benefit from additional PT to further address deficits.    OBJECTIVE IMPAIRMENTS: decreased coordination, decreased strength, increased fascial restrictions, impaired flexibility, improper body mechanics, postural dysfunction, and pain.   ACTIVITY LIMITATIONS: continence  PARTICIPATION LIMITATIONS: interpersonal relationship and community activity  PERSONAL FACTORS: Time since onset of injury/illness/exacerbation and 1 comorbidity: breast cancer history with hormone (+)  are also affecting patient's functional outcome.   REHAB POTENTIAL: Good  CLINICAL DECISION MAKING: Stable/uncomplicated  EVALUATION COMPLEXITY: Low   GOALS: Goals reviewed with patient? Yes  SHORT TERM GOALS: Target date: 01/19/23  Pt to be I with HEP.  Baseline: Goal status: INITIAL  2.  Pt to demonstrate improved coordination of pelvic floor and breathing mechanics with 20# squat with appropriate synergistic patterns to decrease pain and leakage at least 75% of the time.    Baseline:  Goal status: INITIAL  3.  Pt to be I with urge drill and bladder  irritants to decreased urinary leakage.  Baseline:  Goal status: INITIAL   LONG TERM GOALS: Target date: 04/24/23  Pt to be I with advanced HEP.  Baseline:  Goal status: INITIAL  2.  Pt to demonstrate improved coordination of pelvic floor and breathing mechanics with 30# squat with appropriate synergistic patterns to decrease pain and leakage at least 75% of the time.  Baseline:  Goal status: INITIAL  3.  Pt to demonstrate at  least 4/5 pelvic floor strength for improved pelvic stability and decreased strain at pelvic floor/ decrease leakage.  Baseline:  Goal status: INITIAL  4.  Pt to report no more than one urinary leakage instance in a week for decreased symptoms of leakage and improved QOL. Baseline:  Goal status: INITIAL    PLAN:  PT FREQUENCY: 1x/week  PT DURATION:  4 sessions  PLANNED INTERVENTIONS: 97110-Therapeutic exercises, 97530- Therapeutic activity, 97112- Neuromuscular re-education, 97535- Self Care, 16109- Manual therapy, Patient/Family education, Taping, Dry Needling, Joint mobilization, Spinal mobilization, Scar mobilization, Cryotherapy, Moist heat, and Biofeedback  PLAN FOR NEXT SESSION: internal if needed and pt consents, breathing mechanics, voiding mechanics, urge drill, coordination of pelvic floor and breathing, pelvic floor strengthening, hip and core strengthening   Otelia Sergeant, PT, DPT 10/29/242:00 PM

## 2023-01-02 LAB — SIGNATERA ONLY (NATERA MANAGED)
SIGNATERA MTM READOUT: 0 MTM/ml
SIGNATERA TEST RESULT: NEGATIVE

## 2023-01-05 ENCOUNTER — Encounter: Payer: Managed Care, Other (non HMO) | Admitting: Physical Therapy

## 2023-01-05 ENCOUNTER — Encounter (HOSPITAL_COMMUNITY): Payer: Self-pay

## 2023-01-07 ENCOUNTER — Telehealth: Payer: Self-pay

## 2023-01-07 NOTE — Telephone Encounter (Signed)
Attempted to call pt regarding signatera results lvm that results was negative and  for pt to return call back with any questions or concerns.

## 2023-01-11 ENCOUNTER — Ambulatory Visit
Admission: RE | Admit: 2023-01-11 | Discharge: 2023-01-11 | Disposition: A | Discharge status: Home / Self Care | Payer: Managed Care, Other (non HMO) | Source: Ambulatory Visit | Attending: Hematology and Oncology | Admitting: Hematology and Oncology

## 2023-01-11 DIAGNOSIS — C50412 Malignant neoplasm of upper-outer quadrant of left female breast: Secondary | ICD-10-CM

## 2023-01-11 MED ORDER — GADOPICLENOL 0.5 MMOL/ML IV SOLN
6.0000 mL | Freq: Once | INTRAVENOUS | Status: AC | PRN
  Administered 2023-01-11: 6 mL via INTRAVENOUS

## 2023-01-12 ENCOUNTER — Ambulatory Visit: Payer: Commercial Managed Care - HMO | Attending: Family Medicine | Admitting: Physical Therapy

## 2023-01-12 ENCOUNTER — Encounter: Payer: Self-pay | Admitting: Psychiatry

## 2023-01-12 ENCOUNTER — Telehealth: Payer: Self-pay

## 2023-01-12 DIAGNOSIS — Z483 Aftercare following surgery for neoplasm: Secondary | ICD-10-CM | POA: Insufficient documentation

## 2023-01-12 DIAGNOSIS — R293 Abnormal posture: Secondary | ICD-10-CM | POA: Insufficient documentation

## 2023-01-12 DIAGNOSIS — R279 Unspecified lack of coordination: Secondary | ICD-10-CM | POA: Diagnosis present

## 2023-01-12 DIAGNOSIS — M6281 Muscle weakness (generalized): Secondary | ICD-10-CM | POA: Insufficient documentation

## 2023-01-12 NOTE — Therapy (Signed)
OUTPATIENT PHYSICAL THERAPY FEMALE PELVIC TREATMENT   Patient Name: Michaela Roman MRN: [id number] DOB:11/12/1970, 52 y.o., female Today's Date: 01/12/2023  END OF SESSION:  PT End of Session - 01/12/23 1452     Visit Number 3    Date for PT Re-Evaluation 04/24/23    Authorization Type [employer]    PT Start Time 1449    PT Stop Time 1516    PT Time Calculation (min) 27 min             Past Medical History:  Diagnosis Date   Anxiety    Arthritis    OP   Breast cancer (HCC)    Depression    Family history of breast cancer 02/13/2020   History of radiation therapy 04/17/20-06/03/20   IMRT to Left breast Dr. 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   Personal history of radiation therapy    Premature ovarian failure    Dx'd age 25   Thyroid disease    hypothyroid   Past Surgical History:  Procedure Laterality Date   BREAST BIOPSY Left 02/04/2020   breast and lymphnode   BREAST LUMPECTOMY Left 03/12/2020   BREAST LUMPECTOMY WITH RADIOACTIVE SEED AND SENTINEL LYMPH NODE BIOPSY Left 03/12/2020   Procedure: LEFT BREAST LUMPECTOMY WITH RADIOACTIVE SEED AND LEFT TARGETED RADIOACTIVE SEED LYMPH NODE BIOPSY AND LEFT SENTINEL LYMPH NODE MAPPING;  Surgeon: Harriette Bouillon, MD;  Location: [facility];  Service: General;  Laterality: Left;   DILATATION & CURETTAGE/HYSTEROSCOPY WITH MYOSURE N/A 10/07/2015   Procedure: DILATATION & CURETTAGE/HYSTEROSCOPY WITH MYOSURE;  Surgeon: Patton Salles, MD;  Location: [facility];  Service: Gynecology;  Laterality: N/A;  endometrial polyp   SCAR REVISION Left 02/05/2021   Procedure: left breast SCAR REVISION;  Surgeon: Harriette Bouillon, MD;  Location: [facility];  Service: General;  Laterality: Left;  60   WISDOM TOOTH EXTRACTION     Patient Active Problem List   Diagnosis Date Noted   Bilateral carpal tunnel syndrome 07/22/2020   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   Genetic  testing 02/21/2020   Family history of prostate cancer 02/13/2020   Family history of breast cancer 02/13/2020   Family history of colon cancer 02/13/2020   Malignant neoplasm of upper-outer quadrant of left breast in female, estrogen receptor positive (HCC) 02/06/2020    PCP: Shon Hale, MD  REFERRING PROVIDER: Shon Hale, MD   REFERRING DIAG: R32 (ICD-10-CM) - Unspecified urinary incontinence  THERAPY DIAG:  Muscle weakness (generalized)  Abnormal posture  Unspecified lack of coordination  Rationale for Evaluation and Treatment: Rehabilitation  ONSET DATE: 2 months ago  SUBJECTIVE:  SUBJECTIVE STATEMENT: Pt reports no leakage in the last few weeks, and urge is now every 2-4 hours during the day and usually 0-1x at night.   Fluid intake: Yes: water - 64-70 oz; coffee in the morning, herbal tea in the evening sometimes.     PAIN:  Are you having pain? No   PRECAUTIONS: Other: history of breast cancer  RED FLAGS: None   WEIGHT BEARING RESTRICTIONS: No  FALLS:  Has patient fallen in last 6 months? No  LIVING ENVIRONMENT: Lives with: lives with their family Lives in: House/apartment   OCCUPATION: Clinical research associate   PLOF: Independent  PATIENT GOALS: to do kegels correctly and have stronger pelvic floor   PERTINENT HISTORY:  breast cancer - 03/2020, on tamoxifen, radiation, breast CA,  Depression,  Sexual abuse: No  BOWEL MOVEMENT: Pain with bowel movement: No Type of bowel movement:Type (Bristol Stool Scale) 4, Frequency daily, and Strain No Fully empty rectum: Yes:   Leakage: No Pads: No Fiber supplement: No  URINATION: Pain with urination: No Fully empty bladder: Yes:   Stream: Strong Urgency: No Frequency: not quicker than every 2 hours, wakes 1x for  urine but sometimes is just awake and then urinates Leakage: Coughing and Laughing Pads: Yes: ultra thin liners, just sometimes  INTERCOURSE: Pain with intercourse: Initial Penetration Ability to have vaginal penetration:  Yes: dryness Climax: not painful  Marinoff Scale: 0/3  PREGNANCY: Vaginal deliveries 0 Tearing No C-section deliveries 0 Currently pregnant No  PROLAPSE: Sometimes vaginally with waiting to urinate and has very full bladder   OBJECTIVE:  Note: Objective measures were completed at Evaluation unless otherwise noted.  DIAGNOSTIC FINDINGS:   COGNITION: Overall cognitive status: Within functional limits for tasks assessed     SENSATION: Light touch: Appears intact Proprioception: Appears intact  MUSCLE LENGTH: Bil hamstrings and adductors limited by 25%  LUMBAR SPECIAL TESTS:  WFL  FUNCTIONAL TESTS:  WFL  GAIT: WFL  POSTURE: rounded shoulders  PELVIC ALIGNMENT:WFL  LUMBARAROM/PROM:  A/PROM A/PROM  eval  Flexion WFL  Extension   Right lateral flexion WFL  Left lateral flexion WFL  Right rotation Limited by 25%  Left rotation Limited by 25%   (Blank rows = not tested)  LOWER EXTREMITY ROM:  WFL  LOWER EXTREMITY MMT:  Bil hips grossly 4+/5; knees 5/5  PALPATION:   General  no TTP, mild tightness in lumbar spine                External Perineal Exam WFL, mild clitoral hood adhesion with mild dryness                             Internal Pelvic Floor mild TTP with downward palpation at vaginal opening at 4-6 on clock face  Patient confirms identification and approves PT to assess internal pelvic floor and treatment Yes No emotional/communication barriers or cognitive limitation. Patient is motivated to learn. Patient understands and agrees with treatment goals and plan. PT explains patient will be examined in standing, sitting, and lying down to see how their muscles and joints work. When they are ready, they will be asked to remove  their underwear so PT can examine their perineum. The patient is also given the option of providing their own chaperone as one is not provided in our facility. The patient also has the right and is explained the right to defer or refuse any part of the evaluation or treatment including the internal exam.  With the patient's consent, PT will use one gloved finger to gently assess the muscles of the pelvic floor, seeing how well it contracts and relaxes and if there is muscle symmetry. After, the patient will get dressed and PT and patient will discuss exam findings and plan of care. PT and patient discuss plan of care, schedule, attendance policy and HEP activities.  PELVIC MMT:   MMT eval 01/12/23   Vaginal 4/5 with cues for technique, initially 3/5 but improved; 10s; 7 reps 4/5, 10s, 10x  Internal Anal Sphincter    External Anal Sphincter    Puborectalis    Diastasis Recti    (Blank rows = not tested)        TONE:  WFL  PROLAPSE: Not seen in hooklying   TODAY'S TREATMENT:                                                                                                                              DATE:   12/22/22 EVAL Examination completed, findings reviewed, pt educated on POC, HEP. Pt motivated to participate in PT and agreeable to attempt recommendations.    12/28/22  NMRE: all exercises for exhale and pelvic floor contraction with activity for improved coordination of muscle activation and decreased stress at pelvic floor for decreased leakage.  2x10 squats 25# Bear plank 3x30s Lateral lunge 2x10 each X10 jumps in place  Ball sitting pelvic drops 2x10 with diaphragmatic breathing 2x30s happy baby sitting on ball Pelvic tilts on pull noodle x10 each Therapeutic activity - pt educated on urge drill and handout provided.   01/12/23  Patient consented to internal pelvic floor assessment vaginally this date and found to have improved strength, endurance, and coordination. Pt did have  a small area of vaginal opening (at 6 on clock face) with TTP. Pt consented to gentle stretching at bil bulbocavernosus and noted improved mobility and decreased TTP.   PATIENT EDUCATION:  Education details: [id number] Person educated: Patient Education method: Explanation, Demonstration, Tactile cues, Verbal cues, and Handouts Education comprehension: verbalized understanding and returned demonstration  HOME EXERCISE PROGRAM: [id number]  ASSESSMENT:  CLINICAL IMPRESSION: Patient presents for treatment, focus on review of goals for pending last visit next week and rechecking pelvic floor. Pt consented to internal vaginal pelvic floor recheck and noted to have improvement with coordination, endurance, and strength. Details above. Progressed session to manual internally for improved tissue mobility at these areas and decreased TTP. Pt tolerated well and denied pain at end of session. Pt would benefit from additional PT to further address deficits.    OBJECTIVE IMPAIRMENTS: decreased coordination, decreased strength, increased fascial restrictions, impaired flexibility, improper body mechanics, postural dysfunction, and pain.   ACTIVITY LIMITATIONS: continence  PARTICIPATION LIMITATIONS: interpersonal relationship and community activity  PERSONAL FACTORS: Time since onset of injury/illness/exacerbation and 1 comorbidity: breast cancer history with hormone (+)  are also affecting patient's functional outcome.   REHAB POTENTIAL: Good  CLINICAL DECISION MAKING: Stable/uncomplicated  EVALUATION COMPLEXITY: Low   GOALS: Goals reviewed with patient? Yes  SHORT TERM GOALS: Target date: 01/19/23  Pt to be I with HEP.  Baseline: Goal status: INITIAL  2.  Pt to demonstrate improved coordination of pelvic floor and breathing mechanics with 20# squat with appropriate synergistic patterns to decrease pain and leakage at least 75% of the time.    Baseline:  Goal status: INITIAL  3.  Pt to be I  with urge drill and bladder irritants to decreased urinary leakage.  Baseline:  Goal status: INITIAL   LONG TERM GOALS: Target date: 04/24/23  Pt to be I with advanced HEP.  Baseline:  Goal status: INITIAL  2.  Pt to demonstrate improved coordination of pelvic floor and breathing mechanics with 30# squat with appropriate synergistic patterns to decrease pain and leakage at least 75% of the time.  Baseline:  Goal status: INITIAL  3.  Pt to demonstrate at least 4/5 pelvic floor strength for improved pelvic stability and decreased strain at pelvic floor/ decrease leakage.  Baseline:  Goal status: INITIAL  4.  Pt to report no more than one urinary leakage instance in a week for decreased symptoms of leakage and improved QOL. Baseline:  Goal status: INITIAL    PLAN:  PT FREQUENCY: 1x/week  PT DURATION:  4 sessions  PLANNED INTERVENTIONS: 97110-Therapeutic exercises, 97530- Therapeutic activity, 97112- Neuromuscular re-education, 97535- Self Care, [id number]- Manual therapy, Patient/Family education, Taping, Dry Needling, Joint mobilization, Spinal mobilization, Scar mobilization, Cryotherapy, Moist heat, and Biofeedback  PLAN FOR NEXT SESSION: internal if needed and pt consents, breathing mechanics, voiding mechanics, urge drill, coordination of pelvic floor and breathing, pelvic floor strengthening, hip and core strengthening   Otelia Sergeant, PT, DPT 11/13/243:28 PM

## 2023-01-12 NOTE — Telephone Encounter (Signed)
Called pt per Np left Vm with good news. Breast MRI negative for malignancy. Advise pt to call back if she has any questions or concerns and ask to speak to the nurse.

## 2023-01-17 ENCOUNTER — Encounter: Payer: Self-pay | Admitting: Physical Therapy

## 2023-01-19 ENCOUNTER — Ambulatory Visit: Payer: Commercial Managed Care - HMO | Admitting: Physical Therapy

## 2023-01-19 ENCOUNTER — Telehealth (INDEPENDENT_AMBULATORY_CARE_PROVIDER_SITE_OTHER): Payer: 59 | Admitting: Behavioral Health

## 2023-01-19 ENCOUNTER — Encounter: Payer: Self-pay | Admitting: Behavioral Health

## 2023-01-19 DIAGNOSIS — F411 Generalized anxiety disorder: Secondary | ICD-10-CM | POA: Diagnosis not present

## 2023-01-19 DIAGNOSIS — F331 Major depressive disorder, recurrent, moderate: Secondary | ICD-10-CM

## 2023-01-19 DIAGNOSIS — R293 Abnormal posture: Secondary | ICD-10-CM

## 2023-01-19 DIAGNOSIS — M6281 Muscle weakness (generalized): Secondary | ICD-10-CM | POA: Diagnosis not present

## 2023-01-19 DIAGNOSIS — R454 Irritability and anger: Secondary | ICD-10-CM

## 2023-01-19 DIAGNOSIS — R279 Unspecified lack of coordination: Secondary | ICD-10-CM

## 2023-01-19 MED ORDER — VILAZODONE HCL 20 MG PO TABS: 20.0000 mg | ORAL_TABLET | Freq: Every day | ORAL | 1 refills | Status: DC

## 2023-01-19 NOTE — Therapy (Signed)
OUTPATIENT PHYSICAL THERAPY FEMALE PELVIC TREATMENT   Patient Name: Michaela Roman MRN: [id number] DOB:26-Mar-1970, 52 y.o., female Today's Date: 01/19/2023  END OF SESSION:  PT End of Session - 01/19/23 1150     Visit Number 4    Date for PT Re-Evaluation 04/24/23    Authorization Type [employer]    PT Start Time 1147    PT Stop Time 1226    PT Time Calculation (min) 39 min             Past Medical History:  Diagnosis Date   Anxiety    Arthritis    OP   Breast cancer (HCC)    Depression    Family history of breast cancer 02/13/2020   History of radiation therapy 04/17/20-06/03/20   IMRT to Left breast Dr. 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   Personal history of radiation therapy    Premature ovarian failure    Dx'd age 15   Thyroid disease    hypothyroid   Past Surgical History:  Procedure Laterality Date   BREAST BIOPSY Left 02/04/2020   breast and lymphnode   BREAST LUMPECTOMY Left 03/12/2020   BREAST LUMPECTOMY WITH RADIOACTIVE SEED AND SENTINEL LYMPH NODE BIOPSY Left 03/12/2020   Procedure: LEFT BREAST LUMPECTOMY WITH RADIOACTIVE SEED AND LEFT TARGETED RADIOACTIVE SEED LYMPH NODE BIOPSY AND LEFT SENTINEL LYMPH NODE MAPPING;  Surgeon: Harriette Bouillon, MD;  Location: [facility];  Service: General;  Laterality: Left;   DILATATION & CURETTAGE/HYSTEROSCOPY WITH MYOSURE N/A 10/07/2015   Procedure: DILATATION & CURETTAGE/HYSTEROSCOPY WITH MYOSURE;  Surgeon: Patton Salles, MD;  Location: [facility];  Service: Gynecology;  Laterality: N/A;  endometrial polyp   SCAR REVISION Left 02/05/2021   Procedure: left breast SCAR REVISION;  Surgeon: Harriette Bouillon, MD;  Location: [facility];  Service: General;  Laterality: Left;  60   WISDOM TOOTH EXTRACTION     Patient Active Problem List   Diagnosis Date Noted   Bilateral carpal tunnel syndrome 07/22/2020   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   Genetic  testing 02/21/2020   Family history of prostate cancer 02/13/2020   Family history of breast cancer 02/13/2020   Family history of colon cancer 02/13/2020   Malignant neoplasm of upper-outer quadrant of left breast in female, estrogen receptor positive (HCC) 02/06/2020    PCP: Shon Hale, MD  REFERRING PROVIDER: Shon Hale, MD   REFERRING DIAG: R32 (ICD-10-CM) - Unspecified urinary incontinence  THERAPY DIAG:  Muscle weakness (generalized)  Abnormal posture  Unspecified lack of coordination  Rationale for Evaluation and Treatment: Rehabilitation  ONSET DATE: 2 months ago  SUBJECTIVE:  SUBJECTIVE STATEMENT: Did wake with dampness in underwear once this weekend, unsure why. No change in pattern before pattern. 50/50 waking 1x or 0 during the night. No leakage with weight lifting or activity.   Fluid intake: Yes: water - 64-70 oz; coffee in the morning, herbal tea in the evening sometimes.     PAIN:  Are you having pain? No   PRECAUTIONS: Other: history of breast cancer  RED FLAGS: None   WEIGHT BEARING RESTRICTIONS: No  FALLS:  Has patient fallen in last 6 months? No  LIVING ENVIRONMENT: Lives with: lives with their family Lives in: House/apartment   OCCUPATION: Clinical research associate   PLOF: Independent  PATIENT GOALS: to do kegels correctly and have stronger pelvic floor   PERTINENT HISTORY:  breast cancer - 03/2020, on tamoxifen, radiation, breast CA,  Depression,  Sexual abuse: No  BOWEL MOVEMENT: Pain with bowel movement: No Type of bowel movement:Type (Bristol Stool Scale) 4, Frequency daily, and Strain No Fully empty rectum: Yes:   Leakage: No Pads: No Fiber supplement: No  URINATION: Pain with urination: No Fully empty bladder: Yes:   Stream:  Strong Urgency: No Frequency: not quicker than every 2 hours, wakes 1x for urine but sometimes is just awake and then urinates Leakage: Coughing and Laughing Pads: Yes: ultra thin liners, just sometimes  INTERCOURSE: Pain with intercourse: Initial Penetration Ability to have vaginal penetration:  Yes: dryness Climax: not painful  Marinoff Scale: 0/3  PREGNANCY: Vaginal deliveries 0 Tearing No C-section deliveries 0 Currently pregnant No  PROLAPSE: Sometimes vaginally with waiting to urinate and has very full bladder   OBJECTIVE:  Note: Objective measures were completed at Evaluation unless otherwise noted.  DIAGNOSTIC FINDINGS:   COGNITION: Overall cognitive status: Within functional limits for tasks assessed     SENSATION: Light touch: Appears intact Proprioception: Appears intact  MUSCLE LENGTH: Bil hamstrings and adductors limited by 25%  LUMBAR SPECIAL TESTS:  WFL  FUNCTIONAL TESTS:  WFL  GAIT: WFL  POSTURE: rounded shoulders  PELVIC ALIGNMENT:WFL  LUMBARAROM/PROM:  A/PROM A/PROM  eval  Flexion WFL  Extension   Right lateral flexion WFL  Left lateral flexion WFL  Right rotation Limited by 25%  Left rotation Limited by 25%   (Blank rows = not tested)  LOWER EXTREMITY ROM:  WFL  LOWER EXTREMITY MMT:  Bil hips grossly 4+/5; knees 5/5  PALPATION:   General  no TTP, mild tightness in lumbar spine                External Perineal Exam WFL, mild clitoral hood adhesion with mild dryness                             Internal Pelvic Floor mild TTP with downward palpation at vaginal opening at 4-6 on clock face  Patient confirms identification and approves PT to assess internal pelvic floor and treatment Yes No emotional/communication barriers or cognitive limitation. Patient is motivated to learn. Patient understands and agrees with treatment goals and plan. PT explains patient will be examined in standing, sitting, and lying down to see how  their muscles and joints work. When they are ready, they will be asked to remove their underwear so PT can examine their perineum. The patient is also given the option of providing their own chaperone as one is not provided in our facility. The patient also has the right and is explained the right to defer or refuse any part of  the evaluation or treatment including the internal exam. With the patient's consent, PT will use one gloved finger to gently assess the muscles of the pelvic floor, seeing how well it contracts and relaxes and if there is muscle symmetry. After, the patient will get dressed and PT and patient will discuss exam findings and plan of care. PT and patient discuss plan of care, schedule, attendance policy and HEP activities.  PELVIC MMT:   MMT eval 01/12/23   Vaginal 4/5 with cues for technique, initially 3/5 but improved; 10s; 7 reps 4/5, 10s, 10x  Internal Anal Sphincter    External Anal Sphincter    Puborectalis    Diastasis Recti    (Blank rows = not tested)        TONE:  WFL  PROLAPSE: Not seen in hooklying   TODAY'S TREATMENT:                                                                                                                              DATE:   12/28/22  NMRE: all exercises for exhale and pelvic floor contraction with activity for improved coordination of muscle activation and decreased stress at pelvic floor for decreased leakage.  2x10 squats 25# Bear plank 3x30s Lateral lunge 2x10 each X10 jumps in place  Ball sitting pelvic drops 2x10 with diaphragmatic breathing 2x30s happy baby sitting on ball Pelvic tilts on pull noodle x10 each Therapeutic activity - pt educated on urge drill and handout provided.   01/12/23  Patient consented to internal pelvic floor assessment vaginally this date and found to have improved strength, endurance, and coordination. Pt did have a small area of vaginal opening (at 6 on clock face) with TTP. Pt consented to  gentle stretching at bil bulbocavernosus and noted improved mobility and decreased TTP.    01/19/23:  NMRE: all exercises for exhale and pelvic floor contraction with activity for improved coordination of muscle activation and decreased stress at pelvic floor for decreased leakage.  2x10 squats 30# 2x10 lunges 20# Bear plank 3x30s Single leg Sit to stand 2x10 body weight  Quad blue band resistant trunk rotation x10 each Bird dogs 2x10 with elbow/knee meeting Ball sitting happy baby 2x45s Pelvic tilts on ball x10 each  PATIENT EDUCATION:  Education details: [id number] Person educated: Patient Education method: Programmer, multimedia, Demonstration, Tactile cues, Verbal cues, and Handouts Education comprehension: verbalized understanding and returned demonstration  HOME EXERCISE PROGRAM: [id number]  ASSESSMENT:  CLINICAL IMPRESSION: Patient presents for treatment, focus on coordination of pelvic floor and breathing with increased challenge of exercises for improved pelvic floor mobility with activity and decreased leakage. Pt tolerated well and denied pain/leakage at end of session. Pt would benefit from additional PT to further address deficits.    OBJECTIVE IMPAIRMENTS: decreased coordination, decreased strength, increased fascial restrictions, impaired flexibility, improper body mechanics, postural dysfunction, and pain.   ACTIVITY LIMITATIONS: continence  PARTICIPATION LIMITATIONS: interpersonal relationship and community activity  PERSONAL FACTORS: Time  since onset of injury/illness/exacerbation and 1 comorbidity: breast cancer history with hormone (+)  are also affecting patient's functional outcome.   REHAB POTENTIAL: Good  CLINICAL DECISION MAKING: Stable/uncomplicated  EVALUATION COMPLEXITY: Low   GOALS: Goals reviewed with patient? Yes  SHORT TERM GOALS: Target date: 01/19/23  Pt to be I with HEP.  Baseline: Goal status: INITIAL  2.  Pt to demonstrate improved coordination  of pelvic floor and breathing mechanics with 20# squat with appropriate synergistic patterns to decrease pain and leakage at least 75% of the time.    Baseline:  Goal status: INITIAL  3.  Pt to be I with urge drill and bladder irritants to decreased urinary leakage.  Baseline:  Goal status: INITIAL   LONG TERM GOALS: Target date: 04/24/23  Pt to be I with advanced HEP.  Baseline:  Goal status: INITIAL  2.  Pt to demonstrate improved coordination of pelvic floor and breathing mechanics with 30# squat with appropriate synergistic patterns to decrease pain and leakage at least 75% of the time.  Baseline:  Goal status: INITIAL  3.  Pt to demonstrate at least 4/5 pelvic floor strength for improved pelvic stability and decreased strain at pelvic floor/ decrease leakage.  Baseline:  Goal status: INITIAL  4.  Pt to report no more than one urinary leakage instance in a week for decreased symptoms of leakage and improved QOL. Baseline:  Goal status: INITIAL    PLAN:  PT FREQUENCY: 1x/week  PT DURATION:  4 sessions  PLANNED INTERVENTIONS: 97110-Therapeutic exercises, 97530- Therapeutic activity, 97112- Neuromuscular re-education, 97535- Self Care, [id number]- Manual therapy, Patient/Family education, Taping, Dry Needling, Joint mobilization, Spinal mobilization, Scar mobilization, Cryotherapy, Moist heat, and Biofeedback  PLAN FOR NEXT SESSION: internal if needed and pt consents, breathing mechanics, voiding mechanics, urge drill, coordination of pelvic floor and breathing, pelvic floor strengthening, hip and core strengthening   Otelia Sergeant, PT, DPT 01/19/2411:26 PM

## 2023-01-19 NOTE — Progress Notes (Signed)
Michaela Roman [id number] August 04, 1970 52 y.o.  Virtual Visit via Video Note  I connected with pt @ on 01/19/23 at  1:00 PM EST by a video enabled telemedicine application and verified that I am speaking with the correct person using two identifiers.   I discussed the limitations of evaluation and management by telemedicine and the availability of in person appointments. The patient expressed understanding and agreed to proceed.  I discussed the assessment and treatment plan with the patient. The patient was provided an opportunity to ask questions and all were answered. The patient agreed with the plan and demonstrated an understanding of the instructions.   The patient was advised to call back or seek an in-person evaluation if the symptoms worsen or if the condition fails to improve as anticipated.  I provided 30 minutes of non-face-to-face time during this encounter.  The patient was located at home.  The provider was located at [facility].   Joan Flores, NP   Subjective:   Patient ID:  Michaela Roman is a 52 y.o. (DOB [birth year]) female.  Chief Complaint:  Chief Complaint  Patient presents with   Anxiety   Depression   Follow-up   Medication Refill   Medication Problem   Patient Education   Agitation    HPI  52  year old female presents to this office via video visit for  follow up and medication management. She has been off all psychiatric medication since last visit. Says that she had psychological break after the election. Said she became angry and was throwing things, started having feelings of self harm. Her mother came to stay with her and she has resumed psychotherapy. She is requesting to go back on Viibryd at this time.  She feels like she is better this week. She feels safe and verbally contracts for safety with this Clinical research associate. Acknowledges strong family support. Report anxiety 6/10 but says she is worried about the election. Depression is  2/10.  She is getting about 5-6 hours of sleep per night.  No mania, no psychosis. No SI/HI.    Wellbutrin- Insomnia, racing thoughts Stopped after 2 weeks.   Effexor-pt said was not controlling depression Zoloft- Took for a few weeks. Had insomnia. Developed hives.  Lexapro Lorazepam- Had a calming effect. Not effective with decreasing middle of the night awakenings. Melatonin- Vivid dreams Trazodone- Felt sedated but did not sleep.  Lunesta 1mg - said was not effective  Review of Systems:  Review of Systems  Constitutional: Negative.   Allergic/Immunologic: Negative.   Psychiatric/Behavioral:  Positive for agitation and dysphoric mood. The patient is nervous/anxious.     Medications: I have reviewed the patient's current medications.  Current Outpatient Medications  Medication Sig Dispense Refill   Vilazodone HCl 20 MG TABS Take 1 tablet (20 mg total) by mouth daily after breakfast. 90 tablet 1   calcium-vitamin D (OSCAL WITH D) 500-200 MG-UNIT tablet Take 1 tablet by mouth.     Estradiol 10 MCG TABS vaginal tablet Place 1 tablet (10 mcg total) vaginally 2 (two) times a week. 24 tablet 3   gabapentin (NEURONTIN) 300 MG capsule Take 1 capsule (300 mg total) by mouth 3 (three) times daily. 90 capsule 12   hydrOXYzine (ATARAX) 25 MG tablet Take 1 tablet (25 mg total) by mouth 3 (three) times daily as needed. 90 tablet 1   Levomefolate Glucosamine (METHYL-FOLATE PO) Take by mouth.     levothyroxine (SYNTHROID) 112 MCG tablet Take 1 tablet by mouth daily  before breakfast. 90 tablet 0   Magnesium Glycinate 120 MG CAPS 325 mg.     tamoxifen (NOLVADEX) 20 MG tablet TAKE 1 TABLET(20 MG) BY MOUTH DAILY 90 tablet 3   VTAMA 1 % CREA Apply topically.     No current facility-administered medications for this visit.    Medication Side Effects: None  Allergies:  Allergies  Allergen Reactions   Sertraline Hives    Past Medical History:  Diagnosis Date   Anxiety    Arthritis    OP    Breast cancer (HCC)    Depression    Family history of breast cancer 02/13/2020   History of radiation therapy 04/17/20-06/03/20   IMRT to Left breast Dr. 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   Personal history of radiation therapy    Premature ovarian failure    Dx'd age 48   Thyroid disease    hypothyroid    Family History  Problem Relation Age of Onset   Cancer Mother 19       endometrial cancer   Depression Mother    Multiple sclerosis Father    Other Father 67       right leg amputated below knee   Thyroid disease Sister        hypothyroidism   Hypertension Maternal Grandmother    Heart disease Maternal Grandmother    Depression Maternal Grandmother    Diabetes Maternal Grandfather    Other Maternal Grandfather        benign brain tumor   Dementia Paternal Grandmother    Alcohol abuse Paternal Grandfather    Prostate cancer Cousin 55       maternal cousin   Colon cancer Other        MGF's brother; dx 69s   Colon polyps Neg Hx    Esophageal cancer Neg Hx    Rectal cancer Neg Hx    Stomach cancer Neg Hx     Social History   Socioeconomic History   Marital status: Married    Spouse name: Not on file   Number of children: Not on file   Years of education: Not on file   Highest education level: Not on file  Occupational History   Not on file  Tobacco Use   Smoking status: Never   Smokeless tobacco: Never  Vaping Use   Vaping status: Never Used  Substance and Sexual Activity   Alcohol use: Yes    Alcohol/week: 1.0 standard drink of alcohol    Types: 1 Glasses of wine per week    Comment: one drink a week   Drug use: No   Sexual activity: Yes    Partners: Female, Female    Birth control/protection: Post-menopausal, None  Other Topics Concern   Not on file  Social History Narrative   Not on file   Social Determinants of Health   Financial Resource Strain: Not on file  Food Insecurity: Not on file   Transportation Needs: Not on file  Physical Activity: Not on file  Stress: Not on file  Social Connections: Unknown (08/23/2022)   Received from [facility]   Social Network    Social Network: Not on file  Intimate Partner Violence: Unknown (08/23/2022)   Received from [facility]   HITS    Physically Hurt: Not on file    Insult or Talk Down To: Not on file    Threaten Physical Harm: Not  on file    Scream or Curse: Not on file    Past Medical History, Surgical history, Social history, and Family history were reviewed and updated as appropriate.   Please see review of systems for further details on the patient's review from today.   Objective:   Physical Exam:  LMP 08/07/2015 (Approximate)   Physical Exam  Lab Review:     Component Value Date/Time   NA 140 10/01/2021 1501   NA 140 10/17/2018 0922   K 3.9 10/01/2021 1501   CL 106 10/01/2021 1501   CO2 29 10/01/2021 1501   GLUCOSE 101 (H) 10/01/2021 1501   BUN 15 10/01/2021 1501   BUN 12 10/17/2018 0922   CREATININE 1.05 (H) 10/01/2021 1501   CREATININE 0.95 08/22/2015 1653   CALCIUM 8.7 (L) 10/01/2021 1501   PROT 6.7 10/01/2021 1501   PROT 6.5 10/17/2018 0922   ALBUMIN 4.3 10/01/2021 1501   ALBUMIN 4.5 10/17/2018 0922   AST 18 10/01/2021 1501   ALT 14 10/01/2021 1501   ALKPHOS 38 10/01/2021 1501   BILITOT 0.4 10/01/2021 1501   GFRNONAA >60 10/01/2021 1501   GFRAA 81 10/17/2018 0922       Component Value Date/Time   WBC 4.5 10/01/2021 1501   WBC 5.3 03/07/2020 1050   RBC 3.99 10/01/2021 1501   HGB 12.5 10/01/2021 1501   HGB 13.5 10/17/2018 0922   HCT 35.7 (L) 10/01/2021 1501   HCT 40.9 10/17/2018 0922   PLT 163 10/01/2021 1501   PLT 188 10/17/2018 0922   MCV 89.5 10/01/2021 1501   MCV 93 10/17/2018 0922   MCH 31.3 10/01/2021 1501   MCHC 35.0 10/01/2021 1501   RDW 12.2 10/01/2021 1501   RDW 13.1 10/17/2018 0922   LYMPHSABS 1.4 10/01/2021 1501   MONOABS 0.4 10/01/2021 1501   EOSABS 0.1 10/01/2021  1501   BASOSABS 0.0 10/01/2021 1501    No results found for: "POCLITH", "LITHIUM"   No results found for: "PHENYTOIN", "PHENOBARB", "VALPROATE", "CBMZ"   .res Assessment: Plan:    Recommendations   Greater than 50% of 30  min video visit  time with patient was spent on counseling and coordination of care.  We discussed her stability with anxiety and depression. We talked about her mental break  after the election. She feels like she is safe. No self harm. She understands her resources such as [employer], [place], Suicide hotline, after hours number.  To restart Viibryd 20 mg daily. Must take with food.  Will start hydroxyzine 25 mg three times daily for anxiety and sleep at bedtime. May take 50 mg at bedtime if 25 mg not effective.  To report and side effects or worsening symptoms To follow up in 4 weeks to reassess Provided emergency contact information Reviewed PDMP      Mitchelle was seen today for anxiety, depression, follow-up, medication refill, medication problem, patient education and agitation.  Diagnoses and all orders for this visit:  Generalized anxiety disorder -     Vilazodone HCl 20 MG TABS; Take 1 tablet (20 mg total) by mouth daily after breakfast.  Major depressive disorder, recurrent episode, moderate (HCC) -     Vilazodone HCl 20 MG TABS; Take 1 tablet (20 mg total) by mouth daily after breakfast.  Anger reaction -     Vilazodone HCl 20 MG TABS; Take 1 tablet (20 mg total) by mouth daily after breakfast.     Please see After Visit Summary for patient specific instructions.  Future Appointments  Date Time Provider Department Center  01/24/2023  8:30 AM Berna Spare A, PTA [facility] None  02/09/2023 11:45 AM Barbaraann Faster, PT [facility] None  03/16/2023  8:00 AM Patton Salles, MD [facility] None  03/17/2023 11:45 AM Barbaraann Faster, PT [facility] None  03/24/2023 11:45 AM Barbaraann Faster, PT [facility] None  03/31/2023 11:45 AM  Barbaraann Faster, PT [facility] None  04/07/2023 11:45 AM Barbaraann Faster, PT [facility] None  10/04/2023  2:45 PM Causey, Larna Daughters, NP [facility] None    No orders of the defined types were placed in this encounter.     -------------------------------

## 2023-01-20 ENCOUNTER — Encounter: Payer: Self-pay | Admitting: Physical Therapy

## 2023-01-21 ENCOUNTER — Telehealth: Payer: 59 | Admitting: Behavioral Health

## 2023-01-21 ENCOUNTER — Other Ambulatory Visit: Payer: Self-pay | Admitting: Obstetrics and Gynecology

## 2023-01-21 DIAGNOSIS — N952 Postmenopausal atrophic vaginitis: Secondary | ICD-10-CM

## 2023-01-21 NOTE — Telephone Encounter (Signed)
Medication refill request: estradiol vaginal tabs  Last AEX:  10/07/21 Next AEX: 03/16/23 Last MMG (if hormonal medication request): MRI 01/11/23 Bi-rads 2 benign  Refill authorized: please advise

## 2023-01-24 ENCOUNTER — Ambulatory Visit: Payer: Commercial Managed Care - HMO

## 2023-01-24 VITALS — Wt 139.1 lb

## 2023-01-24 DIAGNOSIS — Z483 Aftercare following surgery for neoplasm: Secondary | ICD-10-CM

## 2023-01-24 NOTE — Therapy (Signed)
OUTPATIENT PHYSICAL THERAPY SOZO SCREENING NOTE   Patient Name: Michaela Roman MRN: [id number] DOB:10/18/1970, 52 y.o., female Today's Date: 01/24/2023  PCP: Shon Hale, MD REFERRING PROVIDER: Shon Hale, *   PT End of Session - 01/24/23 0831     Visit Number 4   # unchanged due to screen only   PT Start Time 0829    PT Stop Time 0833    PT Time Calculation (min) 4 min    Activity Tolerance Patient tolerated treatment well    Behavior During Therapy [facility] for tasks assessed/performed             Past Medical History:  Diagnosis Date   Anxiety    Arthritis    OP   Breast cancer (HCC)    Depression    Family history of breast cancer 02/13/2020   History of radiation therapy 04/17/20-06/03/20   IMRT to Left breast Dr. 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   Personal history of radiation therapy    Premature ovarian failure    Dx'd age 40   Thyroid disease    hypothyroid   Past Surgical History:  Procedure Laterality Date   BREAST BIOPSY Left 02/04/2020   breast and lymphnode   BREAST LUMPECTOMY Left 03/12/2020   BREAST LUMPECTOMY WITH RADIOACTIVE SEED AND SENTINEL LYMPH NODE BIOPSY Left 03/12/2020   Procedure: LEFT BREAST LUMPECTOMY WITH RADIOACTIVE SEED AND LEFT TARGETED RADIOACTIVE SEED LYMPH NODE BIOPSY AND LEFT SENTINEL LYMPH NODE MAPPING;  Surgeon: Harriette Bouillon, MD;  Location: [facility];  Service: General;  Laterality: Left;   DILATATION & CURETTAGE/HYSTEROSCOPY WITH MYOSURE N/A 10/07/2015   Procedure: DILATATION & CURETTAGE/HYSTEROSCOPY WITH MYOSURE;  Surgeon: Patton Salles, MD;  Location: [facility];  Service: Gynecology;  Laterality: N/A;  endometrial polyp   SCAR REVISION Left 02/05/2021   Procedure: left breast SCAR REVISION;  Surgeon: Harriette Bouillon, MD;  Location: [facility];  Service: General;  Laterality: Left;  61   WISDOM TOOTH EXTRACTION     Patient  Active Problem List   Diagnosis Date Noted   Bilateral carpal tunnel syndrome 07/22/2020   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 Patient underwent a left lumpectomy and sentinel node biopsy (1/4 positive) on 03/12/2020. It is ER/PR positive and HER2 negative with a Ki67 of 30%.   PRECAUTIONS: left UE Lymphedema risk, None  SUBJECTIVE: Pt returns for her 6 month L-Dex screen.   PAIN:  Are you having pain? No  SOZO SCREENING: Patient was assessed today using the SOZO machine to determine the lymphedema index score. This was compared to her baseline score. It was determined that she is within the recommended range when compared to her baseline and no further action is needed at this time. She will continue SOZO screenings. These are done every 3 months for 2 years post operatively followed by every 6 months for 2 years, and then annually.   L-DEX  FLOWSHEETS - 01/24/23 0800       L-DEX LYMPHEDEMA SCREENING   Measurement Type Unilateral    L-DEX MEASUREMENT EXTREMITY Upper Extremity    POSITION  Standing    DOMINANT SIDE Right    At Risk Side Left    BASELINE SCORE (UNILATERAL) -0.7    L-DEX SCORE (UNILATERAL) -2.4    VALUE CHANGE (UNILAT) -1.7               Hermenia Bers, PTA 01/24/2023, 8:34 AM

## 2023-01-25 ENCOUNTER — Other Ambulatory Visit: Payer: Self-pay | Admitting: Obstetrics and Gynecology

## 2023-01-25 DIAGNOSIS — N952 Postmenopausal atrophic vaginitis: Secondary | ICD-10-CM

## 2023-01-25 NOTE — Telephone Encounter (Signed)
Med refill request: estradiol 10 mcg vaginal tablet Last AEX: 10/27/21 Next AEX: 03/16/23 Last MMG (if hormonal med) 09/11/21 Refill authorized: Last Rx was sent on 01/24/23.  This has been denied as Rx was already sent.

## 2023-02-09 ENCOUNTER — Encounter: Payer: Self-pay | Admitting: Genetic Counselor

## 2023-02-09 ENCOUNTER — Ambulatory Visit: Payer: Commercial Managed Care - HMO | Attending: Family Medicine | Admitting: Physical Therapy

## 2023-02-09 DIAGNOSIS — M6281 Muscle weakness (generalized): Secondary | ICD-10-CM | POA: Insufficient documentation

## 2023-02-09 DIAGNOSIS — R279 Unspecified lack of coordination: Secondary | ICD-10-CM | POA: Diagnosis present

## 2023-02-09 DIAGNOSIS — R293 Abnormal posture: Secondary | ICD-10-CM | POA: Diagnosis present

## 2023-02-09 NOTE — Therapy (Signed)
OUTPATIENT PHYSICAL THERAPY FEMALE PELVIC TREATMENT   Patient Name: Michaela Roman MRN: [id number] DOB:07/18/1970, 52 y.o., female Today's Date: 02/09/2023  END OF SESSION:  PT End of Session - 02/09/23 1151     Visit Number 5    Date for PT Re-Evaluation 04/24/23    Authorization Type [employer]    PT Start Time 1148    PT Stop Time 1228    PT Time Calculation (min) 40 min    Activity Tolerance Patient tolerated treatment well    Behavior During Therapy [facility] for tasks assessed/performed             Past Medical History:  Diagnosis Date   Anxiety    Arthritis    OP   Breast cancer (HCC)    Depression    Family history of breast cancer 02/13/2020   History of radiation therapy 04/17/20-06/03/20   IMRT to Left breast Dr. 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   Personal history of radiation therapy    Premature ovarian failure    Dx'd age 39   Thyroid disease    hypothyroid   Past Surgical History:  Procedure Laterality Date   BREAST BIOPSY Left 02/04/2020   breast and lymphnode   BREAST LUMPECTOMY Left 03/12/2020   BREAST LUMPECTOMY WITH RADIOACTIVE SEED AND SENTINEL LYMPH NODE BIOPSY Left 03/12/2020   Procedure: LEFT BREAST LUMPECTOMY WITH RADIOACTIVE SEED AND LEFT TARGETED RADIOACTIVE SEED LYMPH NODE BIOPSY AND LEFT SENTINEL LYMPH NODE MAPPING;  Surgeon: Harriette Bouillon, MD;  Location: [facility];  Service: General;  Laterality: Left;   DILATATION & CURETTAGE/HYSTEROSCOPY WITH MYOSURE N/A 10/07/2015   Procedure: DILATATION & CURETTAGE/HYSTEROSCOPY WITH MYOSURE;  Surgeon: Patton Salles, MD;  Location: [facility];  Service: Gynecology;  Laterality: N/A;  endometrial polyp   SCAR REVISION Left 02/05/2021   Procedure: left breast SCAR REVISION;  Surgeon: Harriette Bouillon, MD;  Location: [facility];  Service: General;  Laterality: Left;  60   WISDOM TOOTH EXTRACTION     Patient Active Problem List    Diagnosis Date Noted   Bilateral carpal tunnel syndrome 07/22/2020   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   Genetic testing 02/21/2020   Family history of prostate cancer 02/13/2020   Family history of breast cancer 02/13/2020   Family history of colon cancer 02/13/2020   Malignant neoplasm of upper-outer quadrant of left breast in female, estrogen receptor positive (HCC) 02/06/2020    PCP: Shon Hale, MD  REFERRING PROVIDER: Shon Hale, MD   REFERRING DIAG: R32 (ICD-10-CM) - Unspecified urinary incontinence  THERAPY DIAG:  Abnormal posture  Muscle weakness (generalized)  Unspecified lack of coordination  Rationale for Evaluation and Treatment: Rehabilitation  ONSET DATE: 2 months ago  SUBJECTIVE:  SUBJECTIVE STATEMENT: Did wake with dampness in underwear once this weekend, unsure why. No change in pattern before pattern. 50/50 waking 1x or 0 during the night. No leakage with weight lifting or activity.   Fluid intake: Yes: water - 64-70 oz; coffee in the morning, herbal tea in the evening sometimes.     PAIN:  Are you having pain? No   PRECAUTIONS: Other: history of breast cancer  RED FLAGS: None   WEIGHT BEARING RESTRICTIONS: No  FALLS:  Has patient fallen in last 6 months? No  LIVING ENVIRONMENT: Lives with: lives with their family Lives in: House/apartment   OCCUPATION: Clinical research associate   PLOF: Independent  PATIENT GOALS: to do kegels correctly and have stronger pelvic floor   PERTINENT HISTORY:  breast cancer - 03/2020, on tamoxifen, radiation, breast CA,  Depression,  Sexual abuse: No  BOWEL MOVEMENT: Pain with bowel movement: No Type of bowel movement:Type (Bristol Stool Scale) 4, Frequency daily, and Strain No Fully empty rectum: Yes:   Leakage:  No Pads: No Fiber supplement: No  URINATION: Pain with urination: No Fully empty bladder: Yes:   Stream: Strong Urgency: No Frequency: not quicker than every 2 hours, wakes 1x for urine but sometimes is just awake and then urinates Leakage: Coughing and Laughing Pads: Yes: ultra thin liners, just sometimes  INTERCOURSE: Pain with intercourse: Initial Penetration Ability to have vaginal penetration:  Yes: dryness Climax: not painful  Marinoff Scale: 0/3  PREGNANCY: Vaginal deliveries 0 Tearing No C-section deliveries 0 Currently pregnant No  PROLAPSE: Sometimes vaginally with waiting to urinate and has very full bladder   OBJECTIVE:  Note: Objective measures were completed at Evaluation unless otherwise noted.  DIAGNOSTIC FINDINGS:   COGNITION: Overall cognitive status: Within functional limits for tasks assessed     SENSATION: Light touch: Appears intact Proprioception: Appears intact  MUSCLE LENGTH: Bil hamstrings and adductors limited by 25%  LUMBAR SPECIAL TESTS:  WFL  FUNCTIONAL TESTS:  WFL  GAIT: WFL  POSTURE: rounded shoulders  PELVIC ALIGNMENT:WFL  LUMBARAROM/PROM:  A/PROM A/PROM  eval  Flexion WFL  Extension   Right lateral flexion WFL  Left lateral flexion WFL  Right rotation Limited by 25%  Left rotation Limited by 25%   (Blank rows = not tested)  LOWER EXTREMITY ROM:  WFL  LOWER EXTREMITY MMT:  Bil hips grossly 4+/5; knees 5/5  PALPATION:   General  no TTP, mild tightness in lumbar spine                External Perineal Exam WFL, mild clitoral hood adhesion with mild dryness                             Internal Pelvic Floor mild TTP with downward palpation at vaginal opening at 4-6 on clock face  Patient confirms identification and approves PT to assess internal pelvic floor and treatment Yes No emotional/communication barriers or cognitive limitation. Patient is motivated to learn. Patient understands and agrees with  treatment goals and plan. PT explains patient will be examined in standing, sitting, and lying down to see how their muscles and joints work. When they are ready, they will be asked to remove their underwear so PT can examine their perineum. The patient is also given the option of providing their own chaperone as one is not provided in our facility. The patient also has the right and is explained the right to defer or refuse any part of  the evaluation or treatment including the internal exam. With the patient's consent, PT will use one gloved finger to gently assess the muscles of the pelvic floor, seeing how well it contracts and relaxes and if there is muscle symmetry. After, the patient will get dressed and PT and patient will discuss exam findings and plan of care. PT and patient discuss plan of care, schedule, attendance policy and HEP activities.  PELVIC MMT:   MMT eval 01/12/23   Vaginal 4/5 with cues for technique, initially 3/5 but improved; 10s; 7 reps 4/5, 10s, 10x  Internal Anal Sphincter    External Anal Sphincter    Puborectalis    Diastasis Recti    (Blank rows = not tested)        TONE:  WFL  PROLAPSE: Not seen in hooklying   TODAY'S TREATMENT:                                                                                                                              DATE:   01/12/23  Patient consented to internal pelvic floor assessment vaginally this date and found to have improved strength, endurance, and coordination. Pt did have a small area of vaginal opening (at 6 on clock face) with TTP. Pt consented to gentle stretching at bil bulbocavernosus and noted improved mobility and decreased TTP.    01/19/23:  NMRE: all exercises for exhale and pelvic floor contraction with activity for improved coordination of muscle activation and decreased stress at pelvic floor for decreased leakage.  2x10 squats 30# 2x10 lunges 20# Bear plank 3x30s Single leg Sit to stand 2x10 body  weight  Quad blue band resistant trunk rotation x10 each Bird dogs 2x10 with elbow/knee meeting Ball sitting happy baby 2x45s Pelvic tilts on ball x10 each  02/09/23: NMRE: all exercises for exhale and pelvic floor contraction with activity for improved coordination of muscle activation and decreased stress at pelvic floor for decreased leakage.  25' forward and backward x2 jumping Quick lateral hops x10 each agility ladder - 2 lengths quick steps, 2 lengths lateral quick steps each way, 180 hips 2 lengths Bear plank + alt leg extension x10 each Single leg Sit to stand x10 body weight  Pelvic tilts on ball and circles x10 each    PATIENT EDUCATION:  Education details: [id number] Person educated: Patient Education method: Programmer, multimedia, Demonstration, Tactile cues, Verbal cues, and Handouts Education comprehension: verbalized understanding and returned demonstration  HOME EXERCISE PROGRAM: [id number]  ASSESSMENT:  CLINICAL IMPRESSION: Patient presents for treatment, focus on coordination of pelvic floor and breathing with increased challenge of exercises for improved pelvic floor mobility with activity and decreased leakage.progressed with quicker pace activities today. Demonstrated improved pelvic stability but with single leg activity does continue to demonstrate slight pelvic twist but with cues for improved coordination of pelvic floor activation and exhale demonstrated improved control and stability. Pt tolerated well and denied pain/leakage at end of session. Pt would benefit  from additional PT to further address deficits.    OBJECTIVE IMPAIRMENTS: decreased coordination, decreased strength, increased fascial restrictions, impaired flexibility, improper body mechanics, postural dysfunction, and pain.   ACTIVITY LIMITATIONS: continence  PARTICIPATION LIMITATIONS: interpersonal relationship and community activity  PERSONAL FACTORS: Time since onset of injury/illness/exacerbation and  1 comorbidity: breast cancer history with hormone (+)  are also affecting patient's functional outcome.   REHAB POTENTIAL: Good  CLINICAL DECISION MAKING: Stable/uncomplicated  EVALUATION COMPLEXITY: Low   GOALS: Goals reviewed with patient? Yes  SHORT TERM GOALS: Target date: 01/19/23  Pt to be I with HEP.  Baseline: Goal status: MET  2.  Pt to demonstrate improved coordination of pelvic floor and breathing mechanics with 20# squat with appropriate synergistic patterns to decrease pain and leakage at least 75% of the time.    Baseline:  Goal status: MET  3.  Pt to be I with urge drill and bladder irritants to decreased urinary leakage.  Baseline:  Goal status: MET   LONG TERM GOALS: Target date: 04/24/23  Pt to be I with advanced HEP.  Baseline:  Goal status: MET  2.  Pt to demonstrate improved coordination of pelvic floor and breathing mechanics with 30# squat with appropriate synergistic patterns to decrease pain and leakage at least 75% of the time.  Baseline:  Goal status: MET  3.  Pt to demonstrate at least 4/5 pelvic floor strength for improved pelvic stability and decreased strain at pelvic floor/ decrease leakage.  Baseline:  Goal status: MET  4.  Pt to report no more than one urinary leakage instance in a week for decreased symptoms of leakage and improved QOL. Baseline:  Goal status: on going    PLAN:  PT FREQUENCY: 1x/week  PT DURATION:  4 sessions  PLANNED INTERVENTIONS: 97110-Therapeutic exercises, 97530- Therapeutic activity, 97112- Neuromuscular re-education, 97535- Self Care, 86578- Manual therapy, Patient/Family education, Taping, Dry Needling, Joint mobilization, Spinal mobilization, Scar mobilization, Cryotherapy, Moist heat, and Biofeedback  PLAN FOR NEXT SESSION: internal if needed and pt consents, breathing mechanics, voiding mechanics, urge drill, coordination of pelvic floor and breathing, pelvic floor strengthening, hip and core  strengthening   Otelia Sergeant, PT, DPT 02/09/2411:28 PM

## 2023-02-09 NOTE — Progress Notes (Signed)
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

## 2023-03-03 NOTE — Progress Notes (Signed)
 53 y.o. G24P0000 Married Caucasian female here for annual exam.    On Tamoxifen  and using Vagifem .  Vagifem  helps with dryness and discomfort.   PHQ-9 score. Has a therapist and a psychiatric nurse who manages her medication.  Has experienced post election depression.  She had stopped her medication and is now feeling better as she has started treating again.  She denies suicidal ideation at this time and has a safety plan that she created with her therapist.  Doing urinary incontinence and doing pelvic floor therapy through [facility].   Having night time urinary incontinence.  Has returned from a trip to [place].  Has a restaurant business.  PCP: Ransom Byers, MD   Patient's last menstrual period was 08/07/2015 (approximate).           Sexually active: Yes.    The current method of family planning is post menopausal status.    Menopausal hormone therapy:  estradiol  Exercising: Yes.     Strength training, HIIT, yoga, walking Smoker:  no  OB History  Gravida Para Term Preterm AB Living  0 0 0 0 0 0  SAB IAB Ectopic Multiple Live Births  0 0 0 0      HEALTH MAINTENANCE: Last 2 paps:  10/07/21 neg: HR HPV neg, 06/24/17 neg: HR HPV neg History of abnormal Pap or positive HPV:  no Mammogram: 01-11-23  BI-RADS CAT 2 benign MRI Colonoscopy:  12/15/21 - due in 2033. Bone Density:  06/03/21  Result  osteopenic   Immunization History  Administered Date(s) Administered   Influenza, Quadrivalent, Recombinant, Inj, Pf 11/19/2017   Influenza,inj,Quad PF,6+ Mos 12/18/2016   Tdap 08/22/2015      reports that she has never smoked. She has never used smokeless tobacco. She reports current alcohol use of about 1.0 standard drink of alcohol per week. She reports that she does not use drugs.  Past Medical History:  Diagnosis Date   Anxiety    Arthritis    OP   Breast cancer (HCC)    Depression    Family history of breast cancer 02/13/2020   History of radiation therapy  04/17/20-06/03/20   IMRT to Left breast Dr. 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   Personal history of radiation therapy    Premature ovarian failure    Dx'd age 75   Thyroid  disease    hypothyroid    Past Surgical History:  Procedure Laterality Date   BREAST BIOPSY Left 02/04/2020   breast and lymphnode   BREAST LUMPECTOMY Left 03/12/2020   BREAST LUMPECTOMY WITH RADIOACTIVE SEED AND SENTINEL LYMPH NODE BIOPSY Left 03/12/2020   Procedure: LEFT BREAST LUMPECTOMY WITH RADIOACTIVE SEED AND LEFT TARGETED RADIOACTIVE SEED LYMPH NODE BIOPSY AND LEFT SENTINEL LYMPH NODE MAPPING;  Surgeon: Sim Dryer, MD;  Location: [facility] OR;  Service: General;  Laterality: Left;   DILATATION & CURETTAGE/HYSTEROSCOPY WITH MYOSURE N/A 10/07/2015   Procedure: DILATATION & CURETTAGE/HYSTEROSCOPY WITH MYOSURE;  Surgeon: Greta Leatherwood, MD;  Location: [facility];  Service: Gynecology;  Laterality: N/A;  endometrial polyp   SCAR REVISION Left 02/05/2021   Procedure: left breast SCAR REVISION;  Surgeon: Sim Dryer, MD;  Location: [facility];  Service: General;  Laterality: Left;  61   WISDOM TOOTH EXTRACTION      Current Outpatient Medications  Medication Sig Dispense Refill   calcium-vitamin D  (OSCAL WITH D) 500-200 MG-UNIT tablet Take  1 tablet by mouth.     Estradiol  10 MCG TABS vaginal tablet Place one tablet (10 mcg) in the vagina at bedtime twice weekly. 12 tablet 0   gabapentin  (NEURONTIN ) 300 MG capsule Take 1 capsule (300 mg total) by mouth 3 (three) times daily. 90 capsule 12   hydrOXYzine  (ATARAX ) 25 MG tablet Take 1 tablet (25 mg total) by mouth 3 (three) times daily as needed. 90 tablet 1   Levomefolate Glucosamine (METHYL-FOLATE PO) Take by mouth.     levothyroxine  (SYNTHROID ) 100 MCG tablet Take 100 mcg by mouth every morning.     Magnesium Glycinate 120 MG CAPS 325 mg.     RETIN-A 0.05 % cream SMARTSIG:sparingly Topical  Every Night     tamoxifen  (NOLVADEX ) 20 MG tablet TAKE 1 TABLET(20 MG) BY MOUTH DAILY 90 tablet 3   Vilazodone  HCl 20 MG TABS Take 1 tablet (20 mg total) by mouth daily after breakfast. 90 tablet 1   VTAMA 1 % CREA Apply topically.     No current facility-administered medications for this visit.    ALLERGIES: Sertraline   Family History  Problem Relation Age of Onset   Cancer Mother 81       endometrial cancer   Depression Mother    Multiple sclerosis Father    Other Father 32       right leg amputated below knee   Thyroid  disease Sister        hypothyroidism   Hypertension Maternal Grandmother    Heart disease Maternal Grandmother    Depression Maternal Grandmother    Diabetes Maternal Grandfather    Other Maternal Grandfather        benign brain tumor   Dementia Paternal Grandmother    Alcohol abuse Paternal Grandfather    Prostate cancer Cousin 28       maternal cousin   Colon cancer Other        MGF's brother; dx 45s   Colon polyps Neg Hx    Esophageal cancer Neg Hx    Rectal cancer Neg Hx    Stomach cancer Neg Hx     Review of Systems  Genitourinary:        Incontinence- being seen by PT- Dr. Kurt Phi  All other systems reviewed and are negative.   PHYSICAL EXAM:  BP 108/64   Pulse 78   Resp 14   Ht 5' 2.75" (1.594 m)   Wt 145 lb (65.8 kg)   LMP 08/07/2015 (Approximate)   BMI 25.89 kg/m     General appearance: alert, cooperative and appears stated age Head: normocephalic, without obvious abnormality, atraumatic Neck: no adenopathy, supple, symmetrical, trachea midline and thyroid  normal to inspection and palpation Lungs: clear to auscultation bilaterally Breasts: right - normal appearance, no masses or tenderness, No nipple retraction or dimpling, No nipple discharge or bleeding, No axillary adenopathy Left - scarring around the left areola and nipple, no masses or tenderness, No nipple discharge or bleeding, No axillary adenopathy Heart: regular rate and  rhythm Abdomen: soft, non-tender; no masses, no organomegaly Extremities: extremities normal, atraumatic, no cyanosis or edema Skin: skin color, texture, turgor normal. No rashes or lesions Lymph nodes: cervical, supraclavicular, and axillary nodes normal. Neurologic: grossly normal  Pelvic: External genitalia:  no lesions              No abnormal inguinal nodes palpated.              Urethra:  normal appearing urethra with no masses, tenderness or  lesions              Bartholins and Skenes: normal                 Vagina: normal appearing vagina with normal color and discharge, no lesions              Cervix: no lesions              Pap taken: No. Bimanual Exam:  Uterus:  normal size, contour, position, consistency, mobility, non-tender              Adnexa: no mass, fullness, tenderness              Rectal exam: Yes.  .  Confirms.              Anus:  normal sphincter tone, no lesions  Chaperone was present for exam:  Emmaline Haring, CMA  ASSESSMENT: Well woman visit with gynecologic exam Hx ovarian insufficiency.  Menopause. Vaginal atrophy.  Left breast cancer.  Status post lumpectomy and XRT.  On Tamoxifen .  Genetic testing:  increased risk of prostate cancer and has genetic variant of undetermined significance.  Osteopenia.  Hypothyroidism.  PHQ-9 score of 9.  Currently under care of mental health professional.  PLAN: Mammogram screening discussed. Self breast awareness reviewed. Pap and HRV collected:  No.  Due in 2028. Guidelines for Calcium, Vitamin D , regular exercise program including cardiovascular and weight bearing exercise. Medication refills:  Vagifem  refills for one year.  Patient is aware that she will need to stop Vagifem  if she stops taking tamoxifen .  Labs with PCP and oncology.  [facility].  Patient declines to do this in [place] due to the distance. Follow up:  yearly and prn.

## 2023-03-15 ENCOUNTER — Encounter: Payer: Self-pay | Admitting: Behavioral Health

## 2023-03-15 ENCOUNTER — Telehealth (INDEPENDENT_AMBULATORY_CARE_PROVIDER_SITE_OTHER): Payer: 59 | Admitting: Behavioral Health

## 2023-03-15 DIAGNOSIS — F331 Major depressive disorder, recurrent, moderate: Secondary | ICD-10-CM

## 2023-03-15 DIAGNOSIS — F411 Generalized anxiety disorder: Secondary | ICD-10-CM

## 2023-03-15 DIAGNOSIS — F330 Major depressive disorder, recurrent, mild: Secondary | ICD-10-CM | POA: Diagnosis not present

## 2023-03-15 DIAGNOSIS — F419 Anxiety disorder, unspecified: Secondary | ICD-10-CM | POA: Diagnosis not present

## 2023-03-15 NOTE — Progress Notes (Signed)
 Michaela Roman [id number] September 20, 1970 53 y.o.  Virtual Visit via Video Note  I connected with pt @ on 03/15/23 at  8:00 AM EST by a video enabled telemedicine application and verified that I am speaking with the correct person using two identifiers.   I discussed the limitations of evaluation and management by telemedicine and the availability of in person appointments. The patient expressed understanding and agreed to proceed.  I discussed the assessment and treatment plan with the patient. The patient was provided an opportunity to ask questions and all were answered. The patient agreed with the plan and demonstrated an understanding of the instructions.   The patient was advised to call back or seek an in-person evaluation if the symptoms worsen or if the condition fails to improve as anticipated.  I provided 30 minutes of non-face-to-face time during this encounter.  The patient was located at home.  The provider was located at [facility].   Redell DELENA Pizza, NP   Subjective:   Patient ID:  Michaela Roman is a 53 y.o. (DOB 1970-05-30) female.  Chief Complaint:  Chief Complaint  Patient presents with   Anxiety   Depression   Follow-up   Medication Refill   Patient Education   Stress    HPI 53  year old female presents to this office via video visit for  follow up and medication management. Says she is feeling much better since restarting Viibryd . Her anxiety and depression levels are manageable.  Has questions about long term use. She and her husband just got back from a trip to [place] recently. She feels safe and verbally contracts for safety with this clinical research associate. Acknowledges strong family support. Report anxiety 6/10 but says she is worried about the election. Depression is 2/10.  She is getting about 5-6 hours of sleep per night.  No mania, no psychosis. No SI/HI.    Wellbutrin - Insomnia, racing thoughts Stopped after 2 weeks.   Effexor -pt said was not  controlling depression Zoloft - Took for a few weeks. Had insomnia. Developed hives.  Lexapro  Lorazepam - Had a calming effect. Not effective with decreasing middle of the night awakenings. Melatonin- Vivid dreams Trazodone- Felt sedated but did not sleep.  Lunesta  1mg - said was not effective   Review of Systems:  Review of Systems  Constitutional: Negative.   Allergic/Immunologic: Negative.   Psychiatric/Behavioral:  Positive for dysphoric mood. The patient is nervous/anxious.     Medications: I have reviewed the patient's current medications.  Current Outpatient Medications  Medication Sig Dispense Refill   calcium-vitamin D  (OSCAL WITH D) 500-200 MG-UNIT tablet Take 1 tablet by mouth.     Estradiol  10 MCG TABS vaginal tablet Place one tablet (10 mcg) in the vagina at bedtime twice weekly. 12 tablet 0   gabapentin  (NEURONTIN ) 300 MG capsule Take 1 capsule (300 mg total) by mouth 3 (three) times daily. 90 capsule 12   hydrOXYzine  (ATARAX ) 25 MG tablet Take 1 tablet (25 mg total) by mouth 3 (three) times daily as needed. 90 tablet 1   Levomefolate Glucosamine (METHYL-FOLATE PO) Take by mouth.     levothyroxine  (SYNTHROID ) 112 MCG tablet Take 1 tablet by mouth daily before breakfast. 90 tablet 0   Magnesium Glycinate 120 MG CAPS 325 mg.     tamoxifen  (NOLVADEX ) 20 MG tablet TAKE 1 TABLET(20 MG) BY MOUTH DAILY 90 tablet 3   Vilazodone  HCl 20 MG TABS Take 1 tablet (20 mg total) by mouth daily after breakfast. 90 tablet 1  VTAMA 1 % CREA Apply topically.     No current facility-administered medications for this visit.    Medication Side Effects: None  Allergies:  Allergies  Allergen Reactions   Sertraline  Hives    Past Medical History:  Diagnosis Date   Anxiety    Arthritis    OP   Breast cancer (HCC)    Depression    Family history of breast cancer 02/13/2020   History of radiation therapy 04/17/20-06/03/20   IMRT to Left breast Dr. 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   Personal history of radiation therapy    Premature ovarian failure    Dx'd age 2   Thyroid  disease    hypothyroid    Family History  Problem Relation Age of Onset   Cancer Mother 44       endometrial cancer   Depression Mother    Multiple sclerosis Father    Other Father 68       right leg amputated below knee   Thyroid  disease Sister        hypothyroidism   Hypertension Maternal Grandmother    Heart disease Maternal Grandmother    Depression Maternal Grandmother    Diabetes Maternal Grandfather    Other Maternal Grandfather        benign brain tumor   Dementia Paternal Grandmother    Alcohol abuse Paternal Grandfather    Prostate cancer Cousin 40       maternal cousin   Colon cancer Other        MGF's brother; dx 53s   Colon polyps Neg Hx    Esophageal cancer Neg Hx    Rectal cancer Neg Hx    Stomach cancer Neg Hx     Social History   Socioeconomic History   Marital status: Married    Spouse name: Not on file   Number of children: Not on file   Years of education: Not on file   Highest education level: Not on file  Occupational History   Not on file  Tobacco Use   Smoking status: Never   Smokeless tobacco: Never  Vaping Use   Vaping status: Never Used  Substance and Sexual Activity   Alcohol use: Yes    Alcohol/week: 1.0 standard drink of alcohol    Types: 1 Glasses of wine per week    Comment: one drink a week   Drug use: No   Sexual activity: Yes    Partners: Female, Female    Birth control/protection: Post-menopausal, None  Other Topics Concern   Not on file  Social History Narrative   Not on file   Social Drivers of Health   Financial Resource Strain: Not on file  Food Insecurity: Not on file  Transportation Needs: Not on file  Physical Activity: Not on file  Stress: Not on file  Social Connections: Unknown (08/23/2022)   [facility]   Social Network    Social Network:  Not on file  Intimate Partner Violence: Unknown (08/23/2022)   Received from [facility]   HITS    Physically Hurt: Not on file    Insult or Talk Down To: Not on file    Threaten Physical Harm: Not on file    Scream or Curse: Not on file    Past Medical History, Surgical history, Social history, and Family history were reviewed and updated as appropriate.   Please see review  of systems for further details on the patient's review from today.   Objective:   Physical Exam:  LMP 08/07/2015 (Approximate)   Physical Exam Neurological:     Mental Status: She is alert and oriented to person, place, and time.  Psychiatric:        Attention and Perception: Attention and perception normal.        Mood and Affect: Mood normal.        Speech: Speech normal.        Behavior: Behavior normal. Behavior is cooperative.        Cognition and Memory: Cognition and memory normal.        Judgment: Judgment normal.     Comments: Insight intact     Lab Review:     Component Value Date/Time   NA 140 10/01/2021 1501   NA 140 10/17/2018 0922   K 3.9 10/01/2021 1501   CL 106 10/01/2021 1501   CO2 29 10/01/2021 1501   GLUCOSE 101 (H) 10/01/2021 1501   BUN 15 10/01/2021 1501   BUN 12 10/17/2018 0922   CREATININE 1.05 (H) 10/01/2021 1501   CREATININE 0.95 08/22/2015 1653   CALCIUM 8.7 (L) 10/01/2021 1501   PROT 6.7 10/01/2021 1501   PROT 6.5 10/17/2018 0922   ALBUMIN 4.3 10/01/2021 1501   ALBUMIN 4.5 10/17/2018 0922   AST 18 10/01/2021 1501   ALT 14 10/01/2021 1501   ALKPHOS 38 10/01/2021 1501   BILITOT 0.4 10/01/2021 1501   GFRNONAA >60 10/01/2021 1501   GFRAA 81 10/17/2018 0922       Component Value Date/Time   WBC 4.5 10/01/2021 1501   WBC 5.3 03/07/2020 1050   RBC 3.99 10/01/2021 1501   HGB 12.5 10/01/2021 1501   HGB 13.5 10/17/2018 0922   HCT 35.7 (L) 10/01/2021 1501   HCT 40.9 10/17/2018 0922   PLT 163 10/01/2021 1501   PLT 188 10/17/2018 0922   MCV 89.5 10/01/2021 1501    MCV 93 10/17/2018 0922   MCH 31.3 10/01/2021 1501   MCHC 35.0 10/01/2021 1501   RDW 12.2 10/01/2021 1501   RDW 13.1 10/17/2018 0922   LYMPHSABS 1.4 10/01/2021 1501   MONOABS 0.4 10/01/2021 1501   EOSABS 0.1 10/01/2021 1501   BASOSABS 0.0 10/01/2021 1501    No results found for: POCLITH, LITHIUM   No results found for: PHENYTOIN, PHENOBARB, VALPROATE, CBMZ   .res Assessment: Plan:    Recommendations   Greater than 50% of 30  min video visit  time with patient was spent on counseling and coordination of care.  We discussed her stability with anxiety and depression. Much improvement since last visit. She is utilizing healthy coping mechanisms. Continuing in therapy. NO current SI.  She understands her resources such as [facility], [place], [place], Suicide hotline, after hours number.  To continue Viibryd  20 mg daily. Must take with food.  Will start hydroxyzine  25 mg three times daily for anxiety and sleep at bedtime. May take 50 mg at bedtime if 25 mg not effective.  To report and side effects or worsening symptoms To follow up in 8 weeks to reassess Provided emergency contact information Reviewed PDMP          Nessie was seen today for anxiety, depression, follow-up, medication refill, patient education and stress.  Diagnoses and all orders for this visit:  Major depressive disorder, recurrent episode, moderate (HCC)  Generalized anxiety disorder     Please see After Visit Summary for patient  specific instructions.  Future Appointments  Date Time Provider Department Center  03/16/2023  8:00 AM Cathlyn JAYSON Nikki Bobie FORBES, MD [facility] None  03/17/2023 11:45 AM Donah Darryle RAMAN, PT [facility] None  03/24/2023 11:45 AM Donah Darryle RAMAN, PT [facility] None  03/31/2023 11:45 AM Donah Darryle RAMAN, PT [facility] None  04/07/2023 11:45 AM Donah Darryle RAMAN, PT [facility] None  07/11/2023  8:30 AM Aden Berwyn LABOR, PTA [facility] None  10/04/2023  2:45 PM Causey, Morna Pickle, NP [facility] None    No orders of the defined types were placed in this encounter.     -------------------------------

## 2023-03-16 ENCOUNTER — Encounter: Payer: Self-pay | Admitting: Obstetrics and Gynecology

## 2023-03-16 ENCOUNTER — Ambulatory Visit (INDEPENDENT_AMBULATORY_CARE_PROVIDER_SITE_OTHER): Payer: 59 | Admitting: Obstetrics and Gynecology

## 2023-03-16 ENCOUNTER — Other Ambulatory Visit: Payer: Self-pay

## 2023-03-16 VITALS — BP 108/64 | HR 78 | Resp 14 | Ht 62.75 in | Wt 145.0 lb

## 2023-03-16 DIAGNOSIS — Z780 Asymptomatic menopausal state: Secondary | ICD-10-CM

## 2023-03-16 DIAGNOSIS — R454 Irritability and anger: Secondary | ICD-10-CM

## 2023-03-16 DIAGNOSIS — F5105 Insomnia due to other mental disorder: Secondary | ICD-10-CM

## 2023-03-16 DIAGNOSIS — N952 Postmenopausal atrophic vaginitis: Secondary | ICD-10-CM | POA: Diagnosis not present

## 2023-03-16 DIAGNOSIS — Z01419 Encounter for gynecological examination (general) (routine) without abnormal findings: Secondary | ICD-10-CM | POA: Diagnosis not present

## 2023-03-16 DIAGNOSIS — F411 Generalized anxiety disorder: Secondary | ICD-10-CM

## 2023-03-16 DIAGNOSIS — F331 Major depressive disorder, recurrent, moderate: Secondary | ICD-10-CM

## 2023-03-16 DIAGNOSIS — M8580 Other specified disorders of bone density and structure, unspecified site: Secondary | ICD-10-CM | POA: Diagnosis not present

## 2023-03-16 MED ORDER — VILAZODONE HCL 20 MG PO TABS: 20.0000 mg | ORAL_TABLET | Freq: Every day | ORAL | 0 refills | Status: DC

## 2023-03-16 MED ORDER — ESTRADIOL 10 MCG VA TABS: ORAL_TABLET | VAGINAL | 3 refills | Status: DC

## 2023-03-16 MED ORDER — HYDROXYZINE HCL 25 MG PO TABS: 25.0000 mg | ORAL_TABLET | Freq: Three times a day (TID) | ORAL | 1 refills | Status: DC | PRN

## 2023-03-16 NOTE — Patient Instructions (Signed)

## 2023-03-17 ENCOUNTER — Ambulatory Visit: Payer: 59 | Attending: Family Medicine | Admitting: Physical Therapy

## 2023-03-17 DIAGNOSIS — R293 Abnormal posture: Secondary | ICD-10-CM | POA: Diagnosis not present

## 2023-03-17 DIAGNOSIS — M6281 Muscle weakness (generalized): Secondary | ICD-10-CM | POA: Diagnosis not present

## 2023-03-17 DIAGNOSIS — R279 Unspecified lack of coordination: Secondary | ICD-10-CM | POA: Insufficient documentation

## 2023-03-17 NOTE — Therapy (Signed)
OUTPATIENT PHYSICAL THERAPY FEMALE PELVIC TREATMENT   Patient Name: Michaela Roman MRN: [id number] DOB:Jun 14, 1970, 53 y.o., female Today's Date: 03/17/2023  END OF SESSION:  PT End of Session - 03/17/23 1151     Visit Number 6    Date for PT Re-Evaluation 04/24/23    Authorization Type [employer]    PT Start Time 1146    PT Stop Time 1228    PT Time Calculation (min) 42 min    Activity Tolerance Patient tolerated treatment well    Behavior During Therapy [facility] for tasks assessed/performed             Past Medical History:  Diagnosis Date   Anxiety    Arthritis    OP   Breast cancer (HCC)    Depression    Family history of breast cancer 02/13/2020   History of radiation therapy 04/17/20-06/03/20   IMRT to Left breast Dr. 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   Personal history of radiation therapy    Premature ovarian failure    Dx'd age 29   Thyroid disease    hypothyroid   Past Surgical History:  Procedure Laterality Date   BREAST BIOPSY Left 02/04/2020   breast and lymphnode   BREAST LUMPECTOMY Left 03/12/2020   BREAST LUMPECTOMY WITH RADIOACTIVE SEED AND SENTINEL LYMPH NODE BIOPSY Left 03/12/2020   Procedure: LEFT BREAST LUMPECTOMY WITH RADIOACTIVE SEED AND LEFT TARGETED RADIOACTIVE SEED LYMPH NODE BIOPSY AND LEFT SENTINEL LYMPH NODE MAPPING;  Surgeon: Harriette Bouillon, MD;  Location: [facility];  Service: General;  Laterality: Left;   DILATATION & CURETTAGE/HYSTEROSCOPY WITH MYOSURE N/A 10/07/2015   Procedure: DILATATION & CURETTAGE/HYSTEROSCOPY WITH MYOSURE;  Surgeon: Patton Salles, MD;  Location: [facility];  Service: Gynecology;  Laterality: N/A;  endometrial polyp   SCAR REVISION Left 02/05/2021   Procedure: left breast SCAR REVISION;  Surgeon: Harriette Bouillon, MD;  Location: [facility];  Service: General;  Laterality: Left;  61   WISDOM TOOTH EXTRACTION     Patient Active Problem List    Diagnosis Date Noted   Bilateral carpal tunnel syndrome 07/22/2020   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   Genetic testing 02/21/2020   Family history of prostate cancer 02/13/2020   Family history of breast cancer 02/13/2020   Family history of colon cancer 02/13/2020   Malignant neoplasm of upper-outer quadrant of left breast in female, estrogen receptor positive (HCC) 02/06/2020    PCP: Shon Hale, MD  REFERRING PROVIDER: Shon Hale, MD   REFERRING DIAG: R32 (ICD-10-CM) - Unspecified urinary incontinence  THERAPY DIAG:  Abnormal posture  Muscle weakness (generalized)  Unspecified lack of coordination  Rationale for Evaluation and Treatment: Rehabilitation  ONSET DATE: 2 months ago  SUBJECTIVE:  SUBJECTIVE STATEMENT: Leakage has been good overall, only one small instance of urinary incontinence after a lot of fluid intake before bed while traveling and did have wetness.    Fluid intake: Yes: water - 64-70 oz; coffee in the morning, herbal tea in the evening sometimes.     PAIN:  Are you having pain? No   PRECAUTIONS: Other: history of breast cancer  RED FLAGS: None   WEIGHT BEARING RESTRICTIONS: No  FALLS:  Has patient fallen in last 6 months? No  LIVING ENVIRONMENT: Lives with: lives with their family Lives in: House/apartment   OCCUPATION: Clinical research associate   PLOF: Independent  PATIENT GOALS: to do kegels correctly and have stronger pelvic floor   PERTINENT HISTORY:  breast cancer - 03/2020, on tamoxifen, radiation, breast CA,  Depression,  Sexual abuse: No  BOWEL MOVEMENT: Pain with bowel movement: No Type of bowel movement:Type (Bristol Stool Scale) 4, Frequency daily, and Strain No Fully empty rectum: Yes:   Leakage: No Pads: No Fiber supplement:  No  URINATION: Pain with urination: No Fully empty bladder: Yes:   Stream: Strong Urgency: No Frequency: not quicker than every 2 hours, wakes 1x for urine but sometimes is just awake and then urinates Leakage: Coughing and Laughing Pads: Yes: ultra thin liners, just sometimes  INTERCOURSE: Pain with intercourse: Initial Penetration Ability to have vaginal penetration:  Yes: dryness Climax: not painful  Marinoff Scale: 0/3  PREGNANCY: Vaginal deliveries 0 Tearing No C-section deliveries 0 Currently pregnant No  PROLAPSE: Sometimes vaginally with waiting to urinate and has very full bladder   OBJECTIVE:  Note: Objective measures were completed at Evaluation unless otherwise noted.  DIAGNOSTIC FINDINGS:   COGNITION: Overall cognitive status: Within functional limits for tasks assessed     SENSATION: Light touch: Appears intact Proprioception: Appears intact  MUSCLE LENGTH: Bil hamstrings and adductors limited by 25%  LUMBAR SPECIAL TESTS:  WFL  FUNCTIONAL TESTS:  WFL  GAIT: WFL  POSTURE: rounded shoulders  PELVIC ALIGNMENT:WFL  LUMBARAROM/PROM:  A/PROM A/PROM  eval  Flexion WFL  Extension   Right lateral flexion WFL  Left lateral flexion WFL  Right rotation Limited by 25%  Left rotation Limited by 25%   (Blank rows = not tested)  LOWER EXTREMITY ROM:  WFL  LOWER EXTREMITY MMT:  Bil hips grossly 4+/5; knees 5/5  PALPATION:   General  no TTP, mild tightness in lumbar spine                External Perineal Exam WFL, mild clitoral hood adhesion with mild dryness                             Internal Pelvic Floor mild TTP with downward palpation at vaginal opening at 4-6 on clock face  Patient confirms identification and approves PT to assess internal pelvic floor and treatment Yes No emotional/communication barriers or cognitive limitation. Patient is motivated to learn. Patient understands and agrees with treatment goals and plan. PT  explains patient will be examined in standing, sitting, and lying down to see how their muscles and joints work. When they are ready, they will be asked to remove their underwear so PT can examine their perineum. The patient is also given the option of providing their own chaperone as one is not provided in our facility. The patient also has the right and is explained the right to defer or refuse any part of the evaluation or treatment including  the internal exam. With the patient's consent, PT will use one gloved finger to gently assess the muscles of the pelvic floor, seeing how well it contracts and relaxes and if there is muscle symmetry. After, the patient will get dressed and PT and patient will discuss exam findings and plan of care. PT and patient discuss plan of care, schedule, attendance policy and HEP activities.  PELVIC MMT:   MMT eval 01/12/23   Vaginal 4/5 with cues for technique, initially 3/5 but improved; 10s; 7 reps 4/5, 10s, 10x  Internal Anal Sphincter    External Anal Sphincter    Puborectalis    Diastasis Recti    (Blank rows = not tested)        TONE:  WFL  PROLAPSE: Not seen in hooklying   TODAY'S TREATMENT:                                                                                                                              DATE:   01/19/23:  NMRE: all exercises for exhale and pelvic floor contraction with activity for improved coordination of muscle activation and decreased stress at pelvic floor for decreased leakage.  2x10 squats 30# 2x10 lunges 20# Bear plank 3x30s Single leg Sit to stand 2x10 body weight  Quad blue band resistant trunk rotation x10 each Bird dogs 2x10 with elbow/knee meeting Ball sitting happy baby 2x45s Pelvic tilts on ball x10 each  02/09/23: NMRE: all exercises for exhale and pelvic floor contraction with activity for improved coordination of muscle activation and decreased stress at pelvic floor for decreased leakage.  25'  forward and backward x2 jumping Quick lateral hops x10 each agility ladder - 2 lengths quick steps, 2 lengths lateral quick steps each way, 180 hips 2 lengths Bear plank + alt leg extension x10 each Single leg Sit to stand x10 body weight  Pelvic tilts on ball and circles x10 each   03/17/23: NMRE: all exercises for exhale and pelvic floor contraction with activity for improved coordination of muscle activation and decreased stress at pelvic floor for decreased leakage.  Opp hand/knee ball press 2x10 each Quick lateral hops x10 each Bear plank + ball press x10 each Bosu ball x10 body weight squats, x10 lateral lunges each (one leg on and one on ground) Single leg Sit to stand x10 body weight  Pelvic tilts on ball and circles x10 each  PATIENT EDUCATION:  Education details: [id number] Person educated: Patient Education method: Programmer, multimedia, Demonstration, Tactile cues, Verbal cues, and Handouts Education comprehension: verbalized understanding and returned demonstration  HOME EXERCISE PROGRAM: [id number]  ASSESSMENT:  CLINICAL IMPRESSION: Patient presents for treatment, focus on coordination of pelvic floor and breathing with increased challenge of exercises for improved pelvic floor mobility with activity and decreased leakage. Reviewed all goals and pt has met all goals. Pt tolerated well and denied pain/leakage at end of session. Pt understands she will need a new referral for any future PT  needs.   OBJECTIVE IMPAIRMENTS: decreased coordination, decreased strength, increased fascial restrictions, impaired flexibility, improper body mechanics, postural dysfunction, and pain.   ACTIVITY LIMITATIONS: continence  PARTICIPATION LIMITATIONS: interpersonal relationship and community activity  PERSONAL FACTORS: Time since onset of injury/illness/exacerbation and 1 comorbidity: breast cancer history with hormone (+)  are also affecting patient's functional outcome.   REHAB POTENTIAL:  Good  CLINICAL DECISION MAKING: Stable/uncomplicated  EVALUATION COMPLEXITY: Low   GOALS: Goals reviewed with patient? Yes  SHORT TERM GOALS: Target date: 01/19/23  Pt to be I with HEP.  Baseline: Goal status: MET  2.  Pt to demonstrate improved coordination of pelvic floor and breathing mechanics with 20# squat with appropriate synergistic patterns to decrease pain and leakage at least 75% of the time.    Baseline:  Goal status: MET  3.  Pt to be I with urge drill and bladder irritants to decreased urinary leakage.  Baseline:  Goal status: MET   LONG TERM GOALS: Target date: 04/24/23  Pt to be I with advanced HEP.  Baseline:  Goal status: MET  2.  Pt to demonstrate improved coordination of pelvic floor and breathing mechanics with 30# squat with appropriate synergistic patterns to decrease pain and leakage at least 75% of the time.  Baseline:  Goal status: MET  3.  Pt to demonstrate at least 4/5 pelvic floor strength for improved pelvic stability and decreased strain at pelvic floor/ decrease leakage.  Baseline:  Goal status: MET  4.  Pt to report no more than one urinary leakage instance in a week for decreased symptoms of leakage and improved QOL. Baseline:  Goal status: MET    PLAN:  PT FREQUENCY: 1x/week  PT DURATION:  4 sessions  PLANNED INTERVENTIONS: 97110-Therapeutic exercises, 97530- Therapeutic activity, 97112- Neuromuscular re-education, 97535- Self Care, 16109- Manual therapy, Patient/Family education, Taping, Dry Needling, Joint mobilization, Spinal mobilization, Scar mobilization, Cryotherapy, Moist heat, and Biofeedback  PLAN FOR NEXT SESSION:   PHYSICAL THERAPY DISCHARGE SUMMARY  Visits from Start of Care: 6  Current functional level related to goals / functional outcomes: All goals met.   Remaining deficits: does report rare instance of urinary leakage at night without waking with urge.    Education / Equipment: HEP   Patient agrees  to discharge. Patient goals were met. Patient is being discharged due to meeting the stated rehab goals.   Otelia Sergeant, PT, DPT 03/16/2510:26 PM

## 2023-03-24 ENCOUNTER — Encounter: Payer: Commercial Managed Care - HMO | Admitting: Physical Therapy

## 2023-03-29 DIAGNOSIS — F419 Anxiety disorder, unspecified: Secondary | ICD-10-CM | POA: Diagnosis not present

## 2023-03-29 DIAGNOSIS — F330 Major depressive disorder, recurrent, mild: Secondary | ICD-10-CM | POA: Diagnosis not present

## 2023-03-31 ENCOUNTER — Encounter: Payer: Commercial Managed Care - HMO | Admitting: Physical Therapy

## 2023-04-01 ENCOUNTER — Encounter: Payer: Self-pay | Admitting: Obstetrics and Gynecology

## 2023-04-01 DIAGNOSIS — M8580 Other specified disorders of bone density and structure, unspecified site: Secondary | ICD-10-CM

## 2023-04-01 DIAGNOSIS — Z780 Asymptomatic menopausal state: Secondary | ICD-10-CM

## 2023-04-04 NOTE — Telephone Encounter (Signed)
BMD order pended for [employer].   Previous order placed 03/16/23   Last BMD 06/03/21 AEX 03/16/23 -BS  New BMD order placed. Patient notified.

## 2023-04-07 ENCOUNTER — Encounter: Payer: Commercial Managed Care - HMO | Admitting: Physical Therapy

## 2023-04-12 DIAGNOSIS — F419 Anxiety disorder, unspecified: Secondary | ICD-10-CM | POA: Diagnosis not present

## 2023-04-12 DIAGNOSIS — F330 Major depressive disorder, recurrent, mild: Secondary | ICD-10-CM | POA: Diagnosis not present

## 2023-04-17 ENCOUNTER — Other Ambulatory Visit: Payer: Self-pay | Admitting: Behavioral Health

## 2023-04-17 DIAGNOSIS — F331 Major depressive disorder, recurrent, moderate: Secondary | ICD-10-CM

## 2023-04-17 DIAGNOSIS — R454 Irritability and anger: Secondary | ICD-10-CM

## 2023-04-17 DIAGNOSIS — F411 Generalized anxiety disorder: Secondary | ICD-10-CM

## 2023-04-28 DIAGNOSIS — F330 Major depressive disorder, recurrent, mild: Secondary | ICD-10-CM | POA: Diagnosis not present

## 2023-04-28 DIAGNOSIS — F419 Anxiety disorder, unspecified: Secondary | ICD-10-CM | POA: Diagnosis not present

## 2023-05-06 ENCOUNTER — Other Ambulatory Visit: Payer: Self-pay | Admitting: Behavioral Health

## 2023-05-06 DIAGNOSIS — F411 Generalized anxiety disorder: Secondary | ICD-10-CM

## 2023-05-06 DIAGNOSIS — F5105 Insomnia due to other mental disorder: Secondary | ICD-10-CM

## 2023-05-10 DIAGNOSIS — M25561 Pain in right knee: Secondary | ICD-10-CM | POA: Diagnosis not present

## 2023-05-10 DIAGNOSIS — F330 Major depressive disorder, recurrent, mild: Secondary | ICD-10-CM | POA: Diagnosis not present

## 2023-05-10 DIAGNOSIS — F419 Anxiety disorder, unspecified: Secondary | ICD-10-CM | POA: Diagnosis not present

## 2023-05-11 ENCOUNTER — Telehealth (INDEPENDENT_AMBULATORY_CARE_PROVIDER_SITE_OTHER): Payer: 59 | Admitting: Behavioral Health

## 2023-05-11 ENCOUNTER — Encounter: Payer: Self-pay | Admitting: Behavioral Health

## 2023-05-11 DIAGNOSIS — F5105 Insomnia due to other mental disorder: Secondary | ICD-10-CM

## 2023-05-11 DIAGNOSIS — F99 Mental disorder, not otherwise specified: Secondary | ICD-10-CM

## 2023-05-11 DIAGNOSIS — F331 Major depressive disorder, recurrent, moderate: Secondary | ICD-10-CM

## 2023-05-11 DIAGNOSIS — F411 Generalized anxiety disorder: Secondary | ICD-10-CM

## 2023-05-11 DIAGNOSIS — R454 Irritability and anger: Secondary | ICD-10-CM | POA: Diagnosis not present

## 2023-05-11 MED ORDER — VILAZODONE HCL 20 MG PO TABS: 20.0000 mg | ORAL_TABLET | Freq: Every day | ORAL | 1 refills | Status: DC

## 2023-05-11 MED ORDER — HYDROXYZINE HCL 25 MG PO TABS: 25.0000 mg | ORAL_TABLET | Freq: Three times a day (TID) | ORAL | 1 refills | Status: DC

## 2023-05-11 NOTE — Progress Notes (Signed)
 Michaela Roman [id number] [birth year] 53 y.o.  Virtual Visit via Video Note  I connected with pt @ on 05/11/23 at  8:00 AM EDT by a video enabled telemedicine application and verified that I am speaking with the correct person using two identifiers.   I discussed the limitations of evaluation and management by telemedicine and the availability of in person appointments. The patient expressed understanding and agreed to proceed.  I discussed the assessment and treatment plan with the patient. The patient was provided an opportunity to ask questions and all were answered. The patient agreed with the plan and demonstrated an understanding of the instructions.   The patient was advised to call back or seek an in-person evaluation if the symptoms worsen or if the condition fails to improve as anticipated.  I provided 20 minutes of non-face-to-face time during this encounter.  The patient was located at home.  The provider was located at [facility].   Joan Flores, NP   Subjective:   Patient ID:  Michaela Roman is a 53 y.o. (DOB [birth year]) female.  Chief Complaint: No chief complaint on file.   HPI   53  year old female presents to this office via video visit for  follow up and medication management. Says she is feeling much better since restarting Viibryd. Her anxiety and depression levels are manageable.  Says that she has adapted new coping skills of accepting the things she cannot change. For now she is happy with her medication and would like to continue with current regimen. She feels safe and verbally contracts for safety with this Clinical research associate. Acknowledges strong family support. Report anxiety 3/10 but says she is worried about the election. Depression is 2/10.  She is getting about 7-8 hours of sleep per night.  No mania, no psychosis. No SI/HI.    Wellbutrin- Insomnia, racing thoughts Stopped after 2 weeks.   Effexor-pt said was not controlling  depression Zoloft- Took for a few weeks. Had insomnia. Developed hives.  Lexapro Lorazepam- Had a calming effect. Not effective with decreasing middle of the night awakenings. Melatonin- Vivid dreams Trazodone- Felt sedated but did not sleep.  Lunesta 1mg - said was not effective   Review of Systems:  Review of Systems  Constitutional: Negative.   Allergic/Immunologic: Negative.   Psychiatric/Behavioral:  Positive for dysphoric mood.     Medications: I have reviewed the patient's current medications.  Current Outpatient Medications  Medication Sig Dispense Refill   calcium-vitamin D (OSCAL WITH D) 500-200 MG-UNIT tablet Take 1 tablet by mouth.     Estradiol 10 MCG TABS vaginal tablet Place one tablet (10 mcg) in the vagina at bedtime twice weekly. 12 tablet 3   gabapentin (NEURONTIN) 300 MG capsule Take 1 capsule (300 mg total) by mouth 3 (three) times daily. 90 capsule 12   hydrOXYzine (ATARAX) 25 MG tablet TAKE 1 TABLET 3 TIMES DAILYAS NEEDED 90 tablet 1   Levomefolate Glucosamine (METHYL-FOLATE PO) Take by mouth.     levothyroxine (SYNTHROID) 100 MCG tablet Take 100 mcg by mouth every morning.     Magnesium Glycinate 120 MG CAPS 325 mg.     RETIN-A 0.05 % cream SMARTSIG:sparingly Topical Every Night     tamoxifen (NOLVADEX) 20 MG tablet TAKE 1 TABLET(20 MG) BY MOUTH DAILY 90 tablet 3   Vilazodone HCl 20 MG TABS TAKE 1 TABLET DAILY AFTER  BREAKFAST 90 tablet 0   VTAMA 1 % CREA Apply topically.     No current facility-administered medications  for this visit.    Medication Side Effects: None  Allergies:  Allergies  Allergen Reactions   Sertraline Hives    Past Medical History:  Diagnosis Date   Anxiety    Arthritis    OP   Breast cancer (HCC)    Depression    Family history of breast cancer 02/13/2020   History of radiation therapy 04/17/20-06/03/20   IMRT to Left breast Dr. 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    Personal history of radiation therapy    Premature ovarian failure    Dx'd age 58   Thyroid disease    hypothyroid    Family History  Problem Relation Age of Onset   Cancer Mother 67       endometrial cancer   Depression Mother    Multiple sclerosis Father    Other Father 65       right leg amputated below knee   Thyroid disease Sister        hypothyroidism   Hypertension Maternal Grandmother    Heart disease Maternal Grandmother    Depression Maternal Grandmother    Diabetes Maternal Grandfather    Other Maternal Grandfather        benign brain tumor   Dementia Paternal Grandmother    Alcohol abuse Paternal Grandfather    Prostate cancer Cousin 63       maternal cousin   Colon cancer Other        MGF's brother; dx 68s   Colon polyps Neg Hx    Esophageal cancer Neg Hx    Rectal cancer Neg Hx    Stomach cancer Neg Hx     Social History   Socioeconomic History   Marital status: Married    Spouse name: Not on file   Number of children: Not on file   Years of education: Not on file   Highest education level: Not on file  Occupational History   Not on file  Tobacco Use   Smoking status: Never   Smokeless tobacco: Never  Vaping Use   Vaping status: Never Used  Substance and Sexual Activity   Alcohol use: Yes    Alcohol/week: 1.0 standard drink of alcohol    Types: 1 Glasses of wine per week    Comment: one drink a week   Drug use: No   Sexual activity: Yes    Partners: Female, Female    Birth control/protection: Post-menopausal, None  Other Topics Concern   Not on file  Social History Narrative   Not on file   Social Drivers of Health   Financial Resource Strain: Not on file  Food Insecurity: Not on file  Transportation Needs: Not on file  Physical Activity: Not on file  Stress: Not on file  Social Connections: Unknown (08/23/2022)   Received from [facility]   Social Network    Social Network: Not on file  Intimate Partner Violence: Unknown  (08/23/2022)   Received from [facility]   HITS    Physically Hurt: Not on file    Insult or Talk Down To: Not on file    Threaten Physical Harm: Not on file    Scream or Curse: Not on file    Past Medical History, Surgical history, Social history, and Family history were reviewed and updated as appropriate.   Please see review of systems for further details on the patient's review from today.   Objective:  Physical Exam:  LMP 08/07/2015 (Approximate)   Physical Exam  Lab Review:     Component Value Date/Time   NA 140 10/01/2021 1501   NA 140 10/17/2018 0922   K 3.9 10/01/2021 1501   CL 106 10/01/2021 1501   CO2 29 10/01/2021 1501   GLUCOSE 101 (H) 10/01/2021 1501   BUN 15 10/01/2021 1501   BUN 12 10/17/2018 0922   CREATININE 1.05 (H) 10/01/2021 1501   CREATININE 0.95 08/22/2015 1653   CALCIUM 8.7 (L) 10/01/2021 1501   PROT 6.7 10/01/2021 1501   PROT 6.5 10/17/2018 0922   ALBUMIN 4.3 10/01/2021 1501   ALBUMIN 4.5 10/17/2018 0922   AST 18 10/01/2021 1501   ALT 14 10/01/2021 1501   ALKPHOS 38 10/01/2021 1501   BILITOT 0.4 10/01/2021 1501   GFRNONAA >60 10/01/2021 1501   GFRAA 81 10/17/2018 0922       Component Value Date/Time   WBC 4.5 10/01/2021 1501   WBC 5.3 03/07/2020 1050   RBC 3.99 10/01/2021 1501   HGB 12.5 10/01/2021 1501   HGB 13.5 10/17/2018 0922   HCT 35.7 (L) 10/01/2021 1501   HCT 40.9 10/17/2018 0922   PLT 163 10/01/2021 1501   PLT 188 10/17/2018 0922   MCV 89.5 10/01/2021 1501   MCV 93 10/17/2018 0922   MCH 31.3 10/01/2021 1501   MCHC 35.0 10/01/2021 1501   RDW 12.2 10/01/2021 1501   RDW 13.1 10/17/2018 0922   LYMPHSABS 1.4 10/01/2021 1501   MONOABS 0.4 10/01/2021 1501   EOSABS 0.1 10/01/2021 1501   BASOSABS 0.0 10/01/2021 1501    No results found for: "POCLITH", "LITHIUM"   No results found for: "PHENYTOIN", "PHENOBARB", "VALPROATE", "CBMZ"   .res Assessment: Plan:   Recommendations   Greater than 50% of 20  min video visit   time with patient was spent on counseling and coordination of care.  We discussed her stability with anxiety and depression. Much improvement since last visit. She is utilizing healthy coping mechanisms. Continuing in therapy. NO current SI.  She understands her resources such as [employer], [place], [place], Suicide hotline, after hours number.  To continue Viibryd 20 mg daily. Must take with food.  To continue hydroxyzine 25 mg three times daily for anxiety and sleep at bedtime. May take 50 mg at bedtime if 25 mg not effective.  To report and side effects or worsening symptoms To follow up in 6  months to reassess per pt Provided emergency contact information Reviewed PDMP     There are no diagnoses linked to this encounter.   Please see After Visit Summary for patient specific instructions.  Future Appointments  Date Time Provider Department Center  06/08/2023  9:00 AM [facility]  07/11/2023  8:30 AM Berna Spare A, PTA [facility] None  10/04/2023  2:00 PM Loa Socks, NP [facility] None  03/22/2024  8:00 AM Amundson Illene Regulus, Forrestine Him, MD [facility] None    No orders of the defined types were placed in this encounter.     -------------------------------

## 2023-05-24 DIAGNOSIS — F330 Major depressive disorder, recurrent, mild: Secondary | ICD-10-CM | POA: Diagnosis not present

## 2023-05-24 DIAGNOSIS — F419 Anxiety disorder, unspecified: Secondary | ICD-10-CM | POA: Diagnosis not present

## 2023-06-02 DIAGNOSIS — D2371 Other benign neoplasm of skin of right lower limb, including hip: Secondary | ICD-10-CM | POA: Diagnosis not present

## 2023-06-02 DIAGNOSIS — L814 Other melanin hyperpigmentation: Secondary | ICD-10-CM | POA: Diagnosis not present

## 2023-06-02 DIAGNOSIS — L821 Other seborrheic keratosis: Secondary | ICD-10-CM | POA: Diagnosis not present

## 2023-06-08 ENCOUNTER — Other Ambulatory Visit: Payer: 59

## 2023-06-10 DIAGNOSIS — F330 Major depressive disorder, recurrent, mild: Secondary | ICD-10-CM | POA: Diagnosis not present

## 2023-06-10 DIAGNOSIS — F419 Anxiety disorder, unspecified: Secondary | ICD-10-CM | POA: Diagnosis not present

## 2023-06-12 ENCOUNTER — Other Ambulatory Visit: Payer: Self-pay | Admitting: Hematology and Oncology

## 2023-06-14 LAB — SIGNATERA
SIGNATERA MTM READOUT: 0 MTM/ml
SIGNATERA TEST RESULT: NEGATIVE

## 2023-06-21 DIAGNOSIS — F330 Major depressive disorder, recurrent, mild: Secondary | ICD-10-CM | POA: Diagnosis not present

## 2023-06-21 DIAGNOSIS — F419 Anxiety disorder, unspecified: Secondary | ICD-10-CM | POA: Diagnosis not present

## 2023-06-29 ENCOUNTER — Ambulatory Visit (INDEPENDENT_AMBULATORY_CARE_PROVIDER_SITE_OTHER)

## 2023-06-29 ENCOUNTER — Encounter: Payer: Self-pay | Admitting: Obstetrics and Gynecology

## 2023-06-29 DIAGNOSIS — Z13820 Encounter for screening for osteoporosis: Secondary | ICD-10-CM

## 2023-06-29 DIAGNOSIS — Z780 Asymptomatic menopausal state: Secondary | ICD-10-CM | POA: Diagnosis not present

## 2023-06-29 DIAGNOSIS — M8589 Other specified disorders of bone density and structure, multiple sites: Secondary | ICD-10-CM | POA: Diagnosis not present

## 2023-06-29 DIAGNOSIS — M8580 Other specified disorders of bone density and structure, unspecified site: Secondary | ICD-10-CM

## 2023-07-05 DIAGNOSIS — F330 Major depressive disorder, recurrent, mild: Secondary | ICD-10-CM | POA: Diagnosis not present

## 2023-07-05 DIAGNOSIS — F419 Anxiety disorder, unspecified: Secondary | ICD-10-CM | POA: Diagnosis not present

## 2023-07-11 ENCOUNTER — Ambulatory Visit: Payer: Self-pay | Attending: Family Medicine

## 2023-07-11 VITALS — Wt 146.5 lb

## 2023-07-11 DIAGNOSIS — Z483 Aftercare following surgery for neoplasm: Secondary | ICD-10-CM | POA: Insufficient documentation

## 2023-07-11 NOTE — Therapy (Signed)
 OUTPATIENT PHYSICAL THERAPY SOZO SCREENING NOTE   Patient Name: Michaela Roman MRN: [id number] DOB:02/16/1971, 53 y.o., female Today's Date: 07/11/2023  PCP: Ransom Byers, MD REFERRING PROVIDER: Ransom Byers, *   PT End of Session - 07/11/23 0856     Visit Number 6   # unchanged due to screen only   PT Start Time 0855    PT Stop Time 0859    PT Time Calculation (min) 4 min    Activity Tolerance Patient tolerated treatment well    Behavior During Therapy [facility] for tasks assessed/performed              Past Medical History:  Diagnosis Date   Anxiety    Arthritis    OP   Breast cancer (HCC)    Depression    Family history of breast cancer 02/13/2020   History of radiation therapy 04/17/20-06/03/20   IMRT to Left breast Dr. 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   Personal history of radiation therapy    Premature ovarian failure    Dx'd age 30   Thyroid  disease    hypothyroid   Past Surgical History:  Procedure Laterality Date   BREAST BIOPSY Left 02/04/2020   breast and lymphnode   BREAST LUMPECTOMY Left 03/12/2020   BREAST LUMPECTOMY WITH RADIOACTIVE SEED AND SENTINEL LYMPH NODE BIOPSY Left 03/12/2020   Procedure: LEFT BREAST LUMPECTOMY WITH RADIOACTIVE SEED AND LEFT TARGETED RADIOACTIVE SEED LYMPH NODE BIOPSY AND LEFT SENTINEL LYMPH NODE MAPPING;  Surgeon: Sim Dryer, MD;  Location: [facility];  Service: General;  Laterality: Left;   DILATATION & CURETTAGE/HYSTEROSCOPY WITH MYOSURE N/A 10/07/2015   Procedure: DILATATION & CURETTAGE/HYSTEROSCOPY WITH MYOSURE;  Surgeon: Greta Leatherwood, MD;  Location: [facility];  Service: Gynecology;  Laterality: N/A;  endometrial polyp   SCAR REVISION Left 02/05/2021   Procedure: left breast SCAR REVISION;  Surgeon: Sim Dryer, MD;  Location: [facility];  Service: General;  Laterality: Left;  61   WISDOM TOOTH EXTRACTION     Patient  Active Problem List   Diagnosis Date Noted   Bilateral carpal tunnel syndrome 07/22/2020   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 Patient underwent a left lumpectomy and sentinel node biopsy (1/4 positive) on 03/12/2020. It is ER/PR positive and HER2 negative with a Ki67 of 30%.     PRECAUTIONS: left UE Lymphedema risk,   SUBJECTIVE: here for 6 month SOZO  PAIN:  Are you having pain? No  SOZO SCREENING: Patient was assessed today using the SOZO machine to determine the lymphedema index score. This was compared to her baseline score. It was determined that she is within the recommended range when compared to her baseline and no further action is needed at this time. She will continue SOZO screenings. These are done every 3 months for 2 years post operatively followed by every 6 months for 2 years, and then annually.  L-DEX FLOWSHEETS - 07/11/23 0800       L-DEX LYMPHEDEMA SCREENING   Measurement Type Unilateral    L-DEX MEASUREMENT EXTREMITY Upper Extremity    POSITION  Standing    DOMINANT SIDE Right    At Risk Side Left    BASELINE SCORE (UNILATERAL) -0.7    L-DEX SCORE (UNILATERAL) -1.4    VALUE CHANGE (UNILAT) -0.7                Denyce Flank, PTA 07/11/2023, 8:58 AM

## 2023-07-12 DIAGNOSIS — M25561 Pain in right knee: Secondary | ICD-10-CM | POA: Diagnosis not present

## 2023-07-19 DIAGNOSIS — F419 Anxiety disorder, unspecified: Secondary | ICD-10-CM | POA: Diagnosis not present

## 2023-07-19 DIAGNOSIS — F330 Major depressive disorder, recurrent, mild: Secondary | ICD-10-CM | POA: Diagnosis not present

## 2023-08-09 DIAGNOSIS — F419 Anxiety disorder, unspecified: Secondary | ICD-10-CM | POA: Diagnosis not present

## 2023-08-09 DIAGNOSIS — F330 Major depressive disorder, recurrent, mild: Secondary | ICD-10-CM | POA: Diagnosis not present

## 2023-08-15 DIAGNOSIS — E8889 Other specified metabolic disorders: Secondary | ICD-10-CM | POA: Diagnosis not present

## 2023-08-15 DIAGNOSIS — Z131 Encounter for screening for diabetes mellitus: Secondary | ICD-10-CM | POA: Diagnosis not present

## 2023-08-15 DIAGNOSIS — M8580 Other specified disorders of bone density and structure, unspecified site: Secondary | ICD-10-CM | POA: Diagnosis not present

## 2023-08-16 DIAGNOSIS — F419 Anxiety disorder, unspecified: Secondary | ICD-10-CM | POA: Diagnosis not present

## 2023-08-16 DIAGNOSIS — F330 Major depressive disorder, recurrent, mild: Secondary | ICD-10-CM | POA: Diagnosis not present

## 2023-08-18 ENCOUNTER — Other Ambulatory Visit: Payer: Self-pay | Admitting: Obstetrics and Gynecology

## 2023-08-18 DIAGNOSIS — N952 Postmenopausal atrophic vaginitis: Secondary | ICD-10-CM

## 2023-08-18 NOTE — Telephone Encounter (Signed)
 Med refill request: estradiol  10 mcg vaginal tablet Mail order pharmacy is requesting #24  Last AEX: 03/16/23 Next AEX: 03/22/24 Last MMG (if hormonal med) 01/11/23 BI-RADS 2 benign MRI RX sent 03/16/23 for #12 with 3 refills.   Refill authorized: Please approve or deny as appropriate.

## 2023-08-23 DIAGNOSIS — L239 Allergic contact dermatitis, unspecified cause: Secondary | ICD-10-CM | POA: Diagnosis not present

## 2023-08-30 DIAGNOSIS — F419 Anxiety disorder, unspecified: Secondary | ICD-10-CM | POA: Diagnosis not present

## 2023-08-30 DIAGNOSIS — F330 Major depressive disorder, recurrent, mild: Secondary | ICD-10-CM | POA: Diagnosis not present

## 2023-09-01 DIAGNOSIS — Z6825 Body mass index (BMI) 25.0-25.9, adult: Secondary | ICD-10-CM | POA: Diagnosis not present

## 2023-09-01 DIAGNOSIS — E039 Hypothyroidism, unspecified: Secondary | ICD-10-CM | POA: Diagnosis not present

## 2023-09-01 DIAGNOSIS — F325 Major depressive disorder, single episode, in full remission: Secondary | ICD-10-CM | POA: Diagnosis not present

## 2023-09-01 DIAGNOSIS — G4700 Insomnia, unspecified: Secondary | ICD-10-CM | POA: Diagnosis not present

## 2023-09-01 DIAGNOSIS — E663 Overweight: Secondary | ICD-10-CM | POA: Diagnosis not present

## 2023-09-01 DIAGNOSIS — M8580 Other specified disorders of bone density and structure, unspecified site: Secondary | ICD-10-CM | POA: Diagnosis not present

## 2023-09-05 ENCOUNTER — Other Ambulatory Visit: Payer: Self-pay | Admitting: Obstetrics and Gynecology

## 2023-09-05 DIAGNOSIS — Z1231 Encounter for screening mammogram for malignant neoplasm of breast: Secondary | ICD-10-CM

## 2023-09-13 DIAGNOSIS — F418 Other specified anxiety disorders: Secondary | ICD-10-CM | POA: Diagnosis not present

## 2023-09-13 DIAGNOSIS — F330 Major depressive disorder, recurrent, mild: Secondary | ICD-10-CM | POA: Diagnosis not present

## 2023-09-14 ENCOUNTER — Other Ambulatory Visit: Payer: Self-pay | Admitting: Hematology and Oncology

## 2023-09-15 DIAGNOSIS — G4700 Insomnia, unspecified: Secondary | ICD-10-CM | POA: Diagnosis not present

## 2023-09-15 DIAGNOSIS — F325 Major depressive disorder, single episode, in full remission: Secondary | ICD-10-CM | POA: Diagnosis not present

## 2023-09-15 DIAGNOSIS — E8889 Other specified metabolic disorders: Secondary | ICD-10-CM | POA: Diagnosis not present

## 2023-09-15 DIAGNOSIS — E039 Hypothyroidism, unspecified: Secondary | ICD-10-CM | POA: Diagnosis not present

## 2023-09-15 DIAGNOSIS — M8580 Other specified disorders of bone density and structure, unspecified site: Secondary | ICD-10-CM | POA: Diagnosis not present

## 2023-09-15 DIAGNOSIS — E663 Overweight: Secondary | ICD-10-CM | POA: Diagnosis not present

## 2023-09-15 DIAGNOSIS — Z6825 Body mass index (BMI) 25.0-25.9, adult: Secondary | ICD-10-CM | POA: Diagnosis not present

## 2023-09-16 ENCOUNTER — Ambulatory Visit
Admission: RE | Admit: 2023-09-16 | Discharge: 2023-09-16 | Disposition: A | Discharge status: Home / Self Care | Source: Ambulatory Visit | Attending: Obstetrics and Gynecology | Admitting: Obstetrics and Gynecology

## 2023-09-16 DIAGNOSIS — Z1231 Encounter for screening mammogram for malignant neoplasm of breast: Secondary | ICD-10-CM

## 2023-09-20 DIAGNOSIS — F330 Major depressive disorder, recurrent, mild: Secondary | ICD-10-CM | POA: Diagnosis not present

## 2023-09-20 DIAGNOSIS — F418 Other specified anxiety disorders: Secondary | ICD-10-CM | POA: Diagnosis not present

## 2023-10-04 ENCOUNTER — Inpatient Hospital Stay: Payer: Self-pay | Attending: Adult Health | Admitting: Adult Health

## 2023-10-04 ENCOUNTER — Other Ambulatory Visit: Payer: Self-pay | Admitting: Behavioral Health

## 2023-10-04 VITALS — BP 107/70 | HR 73 | Temp 98.6°F | Resp 18 | Ht 62.75 in | Wt 150.2 lb

## 2023-10-04 DIAGNOSIS — Z79810 Long term (current) use of selective estrogen receptor modulators (SERMs): Secondary | ICD-10-CM | POA: Diagnosis not present

## 2023-10-04 DIAGNOSIS — Z1721 Progesterone receptor positive status: Secondary | ICD-10-CM | POA: Insufficient documentation

## 2023-10-04 DIAGNOSIS — F99 Mental disorder, not otherwise specified: Secondary | ICD-10-CM

## 2023-10-04 DIAGNOSIS — Z923 Personal history of irradiation: Secondary | ICD-10-CM | POA: Insufficient documentation

## 2023-10-04 DIAGNOSIS — M8580 Other specified disorders of bone density and structure, unspecified site: Secondary | ICD-10-CM | POA: Diagnosis not present

## 2023-10-04 DIAGNOSIS — Z803 Family history of malignant neoplasm of breast: Secondary | ICD-10-CM | POA: Diagnosis not present

## 2023-10-04 DIAGNOSIS — Z8042 Family history of malignant neoplasm of prostate: Secondary | ICD-10-CM | POA: Insufficient documentation

## 2023-10-04 DIAGNOSIS — Z1732 Human epidermal growth factor receptor 2 negative status: Secondary | ICD-10-CM | POA: Diagnosis not present

## 2023-10-04 DIAGNOSIS — Z800 Family history of malignant neoplasm of digestive organs: Secondary | ICD-10-CM | POA: Insufficient documentation

## 2023-10-04 DIAGNOSIS — Z170 Estrogen receptor positive status [ER+]: Secondary | ICD-10-CM | POA: Insufficient documentation

## 2023-10-04 DIAGNOSIS — F411 Generalized anxiety disorder: Secondary | ICD-10-CM

## 2023-10-04 DIAGNOSIS — F419 Anxiety disorder, unspecified: Secondary | ICD-10-CM | POA: Insufficient documentation

## 2023-10-04 DIAGNOSIS — C50412 Malignant neoplasm of upper-outer quadrant of left female breast: Secondary | ICD-10-CM | POA: Diagnosis not present

## 2023-10-04 MED ORDER — GABAPENTIN 100 MG PO CAPS: 200.0000 mg | ORAL_CAPSULE | Freq: Every day | ORAL | 3 refills | Status: AC

## 2023-10-04 NOTE — Progress Notes (Signed)
 [facility] Follow up:    Michaela Lamarr RAMAN, MD [address] [place] [address]   DIAGNOSIS:  Cancer Staging  Malignant neoplasm of upper-outer quadrant of left breast in female, estrogen receptor positive (HCC) Staging form: Breast, AJCC 8th Edition - Clinical stage from 02/13/2020: Stage IB (cT1c, cN1, cM0, G2, ER+, PR+, HER2-) - Unsigned Stage prefix: Initial diagnosis    SUMMARY OF ONCOLOGIC HISTORY: Oncology History  Malignant neoplasm of upper-outer quadrant of left breast in female, estrogen receptor positive (HCC)  02/06/2020 Initial Diagnosis   Malignant neoplasm of upper-outer quadrant of left breast in female, estrogen receptor positive (HCC)   02/26/2020 Genetic Testing   Positive hereditary cancer genetic testing: 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  Variant of uncertain significance detected in CDK4 at p.P302L.  No other uncertain or pathogenic variants detected in other genes. The report date is February 26, 2020.    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 Report date is 01/12/2023.   The CancerNext-Expanded gene panel offered by [facility] and includes sequencing, rearrangement, and RNA analysis for the following 77 genes: AIP, ALK, APC, ATM, AXIN2, BAP1, BARD1, BLM, BMPR1A, BRCA1, BRCA2, BRIP1, CDC73, CDH1, CDK4, CDKN1B, CDKN2A, CHEK2, CTNNA1, DICER1, FANCC, FH, FLCN, GALNT12, KIF1B, LZTR1, MAX, MEN1, MET, MLH1, MSH2, MSH3, MSH6, MUTYH, NBN, NF1, NF2, NTHL1, PALB2, PHOX2B, PMS2, POT1, PRKAR1A, PTCH1, PTEN, RAD51C, RAD51D, RB1, RECQL, RET, SDHA, SDHAF2, SDHB, SDHC, SDHD, SMAD4, SMARCA4, SMARCB1, SMARCE1, STK11, SUFU, TMEM127, TP53, TSC1, TSC2, VHL and XRCC2 (sequencing and deletion/duplication); EGFR, EGLN1, HOXB13, KIT, MITF, PDGFRA, POLD1, and POLE (sequencing only); EPCAM and GREM1 (deletion/duplication only).    03/12/2020 Surgery   Left lumpectomy with targeted node dissection:  T1CN1A stage Ib grade 2 IDC with negative margins 1/4 lymph nodes positive, MammaPrint: Low risk    05/2020 -  Anti-estrogen oral therapy   Tamoxifen  daily    - 05/2020 Radiation Therapy       CURRENT THERAPY: Tamoxifen   INTERVAL HISTORY:  Discussed the use of AI scribe software for clinical note transcription with the patient, who gave verbal consent to proceed.  History of Present Illness Michaela Roman is a 53 year old female with breast cancer who presents for a follow-up visit.  She was diagnosed with breast cancer in December 2021 and underwent a lumpectomy followed by adjuvant radiation therapy. She has been on tamoxifen  since March or April 2022. Her most recent mammogram in July 2025 showed no evidence of malignancy, with breast density categorized as B. She feels well and continues to take tamoxifen  without any current breast changes or concerns.  She manages anxiety and sleep issues with sleep hygiene practices and medication. She takes gabapentin  300 mg once daily but is considering tapering off due to potential cognitive side effects. She also uses magnesium for anxiety.  Her bone density shows thinning or osteopenia.     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  MEDICAL HISTORY: Past Medical History:  Diagnosis Date   Anxiety    Arthritis    OP  Breast cancer (HCC)    Depression    Family history of breast cancer 02/13/2020   History of radiation therapy 04/17/20-06/03/20   IMRT to Left breast Dr. 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   Personal history of radiation therapy    Premature ovarian failure    Dx'd age 71   Thyroid  disease    hypothyroid    SURGICAL HISTORY: Past Surgical History:  Procedure Laterality Date   BREAST BIOPSY Left 02/04/2020   breast and lymphnode   BREAST LUMPECTOMY Left 03/12/2020   BREAST LUMPECTOMY WITH RADIOACTIVE SEED AND SENTINEL LYMPH NODE BIOPSY Left 03/12/2020   Procedure: LEFT BREAST LUMPECTOMY WITH RADIOACTIVE SEED AND LEFT TARGETED RADIOACTIVE SEED LYMPH NODE BIOPSY AND LEFT SENTINEL LYMPH NODE MAPPING;  Surgeon: Vanderbilt Ned, MD;  Location: [facility];  Service: General;  Laterality: Left;   DILATATION & CURETTAGE/HYSTEROSCOPY WITH MYOSURE N/A 10/07/2015   Procedure: DILATATION & CURETTAGE/HYSTEROSCOPY WITH MYOSURE;  Surgeon: Bobie FORBES Cathlyn JAYSON Nikki, MD;  Location: [facility];  Service: Gynecology;  Laterality: N/A;  endometrial polyp   SCAR REVISION Left 02/05/2021   Procedure: left breast SCAR REVISION;  Surgeon: Vanderbilt Ned, MD;  [facility];  Service: General;  Laterality: Left;  61   WISDOM TOOTH EXTRACTION      SOCIAL HISTORY: Social History   Socioeconomic History   Marital status: Married    Spouse name: Not on file   Number of children: Not on file   Years of education: Not on file   Highest education level: Not on file  Occupational History   Not on file  Tobacco Use   Smoking status: Never   Smokeless tobacco: Never  Vaping Use   Vaping status: Never Used  Substance and Sexual Activity   Alcohol use: Yes    Alcohol/week: 1.0 standard drink of alcohol    Types: 1 Glasses of wine per week    Comment: one drink a week   Drug use: No   Sexual activity: Yes    Partners: Female, Female    Birth control/protection: Post-menopausal, None  Other Topics Concern   Not on file  Social History Narrative   Not on file   Social Drivers of Health   Financial Resource Strain: Not on file  Food  Insecurity: Not on file  Transportation Needs: Not on file  Physical Activity: Not on file  Stress: Not on file  Social Connections: Unknown (08/23/2022)   [facility]   Social Network    Social Network: Not on file  Intimate Partner Violence: Unknown (08/23/2022)   Received from [facility]   HITS    Physically Hurt: Not on file    Insult or Talk Down To: Not on file    Threaten Physical Harm: Not on file    Scream or Curse: Not on file    FAMILY HISTORY: Family History  Problem Relation Age of Onset   Cancer Mother 37       endometrial cancer   Depression Mother    Multiple sclerosis Father    Other Father 78       right leg amputated below knee   Thyroid  disease Sister        hypothyroidism   Hypertension Maternal Grandmother    Heart disease Maternal Grandmother    Depression Maternal Grandmother    Diabetes Maternal Grandfather    Other Maternal Grandfather  benign brain tumor   Dementia Paternal Grandmother    Alcohol abuse Paternal Grandfather    Prostate cancer Cousin 12       maternal cousin   Colon cancer Other        MGF's brother; dx 78s   Colon polyps Neg Hx    Esophageal cancer Neg Hx    Rectal cancer Neg Hx    Stomach cancer Neg Hx     Review of Systems  Constitutional:  Negative for appetite change, chills, fatigue, fever and unexpected weight change.  HENT:   Negative for hearing loss, lump/mass and trouble swallowing.   Eyes:  Negative for eye problems and icterus.  Respiratory:  Negative for chest tightness, cough and shortness of breath.   Cardiovascular:  Negative for chest pain, leg swelling and palpitations.  Gastrointestinal:  Negative for abdominal distention, abdominal pain, constipation, diarrhea, nausea and vomiting.  Endocrine: Negative for hot flashes.  Genitourinary:  Negative for difficulty urinating.   Musculoskeletal:  Negative for arthralgias.  Skin:  Negative for itching and rash.  Neurological:   Negative for dizziness, extremity weakness, headaches and numbness.  Hematological:  Negative for adenopathy. Does not bruise/bleed easily.  Psychiatric/Behavioral:  Negative for depression. The patient is not nervous/anxious.       PHYSICAL EXAMINATION    Vitals:   10/04/23 1405  BP: 107/70  Pulse: 73  Resp: 18  Temp: 98.6 F (37 C)  SpO2: 96%    Physical Exam Constitutional:      General: She is not in acute distress.    Appearance: Normal appearance. She is not toxic-appearing.  HENT:     Head: Normocephalic and atraumatic.     Mouth/Throat:     Mouth: Mucous membranes are moist.     Pharynx: Oropharynx is clear. No oropharyngeal exudate or posterior oropharyngeal erythema.  Eyes:     General: No scleral icterus. Cardiovascular:     Rate and Rhythm: Normal rate and regular rhythm.     Pulses: Normal pulses.     Heart sounds: Normal heart sounds.  Pulmonary:     Effort: Pulmonary effort is normal.     Breath sounds: Normal breath sounds.  Chest:     Comments: Left breast s/p lumpectomy and radiation, no sign of local recurrence, right breast benign Abdominal:     General: Abdomen is flat. Bowel sounds are normal. There is no distension.     Palpations: Abdomen is soft.     Tenderness: There is no abdominal tenderness.  Musculoskeletal:        General: No swelling.     Cervical back: Neck supple.  Lymphadenopathy:     Cervical: No cervical adenopathy.     Upper Body:     Right upper body: No supraclavicular or axillary adenopathy.     Left upper body: No supraclavicular or axillary adenopathy.  Skin:    General: Skin is warm and dry.     Findings: No rash.  Neurological:     General: No focal deficit present.     Mental Status: She is alert.  Psychiatric:        Mood and Affect: Mood normal.        Behavior: Behavior normal.     LABORATORY DATA:  CBC    Component Value Date/Time   WBC 4.5 10/01/2021 1501   WBC 5.3 03/07/2020 1050   RBC 3.99  10/01/2021 1501   HGB 12.5 10/01/2021 1501   HGB 13.5 10/17/2018 0922  HCT 35.7 (L) 10/01/2021 1501   HCT 40.9 10/17/2018 0922   PLT 163 10/01/2021 1501   PLT 188 10/17/2018 0922   MCV 89.5 10/01/2021 1501   MCV 93 10/17/2018 0922   MCH 31.3 10/01/2021 1501   MCHC 35.0 10/01/2021 1501   RDW 12.2 10/01/2021 1501   RDW 13.1 10/17/2018 0922   LYMPHSABS 1.4 10/01/2021 1501   MONOABS 0.4 10/01/2021 1501   EOSABS 0.1 10/01/2021 1501   BASOSABS 0.0 10/01/2021 1501    CMP     Component Value Date/Time   NA 140 10/01/2021 1501   NA 140 10/17/2018 0922   K 3.9 10/01/2021 1501   CL 106 10/01/2021 1501   CO2 29 10/01/2021 1501   GLUCOSE 101 (H) 10/01/2021 1501   BUN 15 10/01/2021 1501   BUN 12 10/17/2018 0922   CREATININE 1.05 (H) 10/01/2021 1501   CREATININE 0.95 08/22/2015 1653   CALCIUM 8.7 (L) 10/01/2021 1501   PROT 6.7 10/01/2021 1501   PROT 6.5 10/17/2018 0922   ALBUMIN 4.3 10/01/2021 1501   ALBUMIN 4.5 10/17/2018 0922   AST 18 10/01/2021 1501   ALT 14 10/01/2021 1501   ALKPHOS 38 10/01/2021 1501   BILITOT 0.4 10/01/2021 1501   GFRNONAA >60 10/01/2021 1501   GFRAA 81 10/17/2018 0922     ASSESSMENT and THERAPY PLAN:   Assessment and Plan Assessment & Plan History of breast cancer, status post lumpectomy and adjuvant radiation, on tamoxifen  Diagnosed in December 2021, status post lumpectomy and adjuvant radiation. On tamoxifen  since March or April 2020. Recent mammogram in July 2025 showed no malignancy. Discussed continuing tamoxifen  beyond five years based on motivation and side effects. Breast cancer index test to be considered closer to the five-year mark to assess risk for late recurrence and benefit of continued anti-estrogen therapy. - Continue tamoxifen  therapy. - Consider breast cancer index test in 2026. - Perform MRI next year (every other year)  Osteopenia Osteopenia noted on bone density assessment. Tamoxifen  is beneficial for bone health, which is  advantageous given the osteopenia diagnosis.  Anxiety disorder, stable Currently using gabapentin  300 mg once daily, which aids in sleep. Discussed potential cognitive impairment with long-term gabapentin  use. She is considering cycling off gabapentin  and using magnesium and anti-anxiety measures instead. Plan to taper gabapentin  gradually to assess tolerance and determine if discontinuation is feasible. - Prescribe gabapentin  100 mg tablets. - Taper gabapentin  by taking 200 mg nightly for a few weeks, then 100 mg nightly for a few weeks, and assess tolerance.  RTC in 1 year for f/u, or sooner if needed.    All questions were answered. The patient knows to call the clinic with any problems, questions or concerns. We can certainly see the patient much sooner if necessary.  Total encounter time:20 minutes*in face-to-face visit time, chart review, lab review, care coordination, order entry, and documentation of the encounter time.    Morna Kendall, NP 10/05/23 10:52 AM Medical Oncology and Hematology [facility] [address] [place], [place] [address] Tel. [phone]    Fax. [phone]  *Total Encounter Time as defined by the Centers for Medicare and Medicaid Services includes, in addition to the face-to-face time of a patient visit (documented in the note above) non-face-to-face time: obtaining and reviewing outside history, ordering and reviewing medications, tests or procedures, care coordination (communications with other health care professionals or caregivers) and documentation in the medical record.

## 2023-10-05 ENCOUNTER — Encounter: Payer: Self-pay | Admitting: Adult Health

## 2023-10-11 DIAGNOSIS — F330 Major depressive disorder, recurrent, mild: Secondary | ICD-10-CM | POA: Diagnosis not present

## 2023-10-11 DIAGNOSIS — F418 Other specified anxiety disorders: Secondary | ICD-10-CM | POA: Diagnosis not present

## 2023-10-20 DIAGNOSIS — Z6825 Body mass index (BMI) 25.0-25.9, adult: Secondary | ICD-10-CM | POA: Diagnosis not present

## 2023-10-20 DIAGNOSIS — M8580 Other specified disorders of bone density and structure, unspecified site: Secondary | ICD-10-CM | POA: Diagnosis not present

## 2023-10-20 DIAGNOSIS — G4700 Insomnia, unspecified: Secondary | ICD-10-CM | POA: Diagnosis not present

## 2023-10-20 DIAGNOSIS — E039 Hypothyroidism, unspecified: Secondary | ICD-10-CM | POA: Diagnosis not present

## 2023-10-20 DIAGNOSIS — F325 Major depressive disorder, single episode, in full remission: Secondary | ICD-10-CM | POA: Diagnosis not present

## 2023-10-20 DIAGNOSIS — E663 Overweight: Secondary | ICD-10-CM | POA: Diagnosis not present

## 2023-10-25 DIAGNOSIS — F330 Major depressive disorder, recurrent, mild: Secondary | ICD-10-CM | POA: Diagnosis not present

## 2023-10-25 DIAGNOSIS — F418 Other specified anxiety disorders: Secondary | ICD-10-CM | POA: Diagnosis not present

## 2023-11-01 ENCOUNTER — Encounter: Payer: Self-pay | Admitting: Adult Health

## 2023-11-02 ENCOUNTER — Ambulatory Visit: Admitting: Behavioral Health

## 2023-11-08 DIAGNOSIS — F330 Major depressive disorder, recurrent, mild: Secondary | ICD-10-CM | POA: Diagnosis not present

## 2023-11-08 DIAGNOSIS — F418 Other specified anxiety disorders: Secondary | ICD-10-CM | POA: Diagnosis not present

## 2023-11-09 DIAGNOSIS — L501 Idiopathic urticaria: Secondary | ICD-10-CM | POA: Diagnosis not present

## 2023-11-09 DIAGNOSIS — L309 Dermatitis, unspecified: Secondary | ICD-10-CM | POA: Diagnosis not present

## 2023-11-10 ENCOUNTER — Encounter: Payer: Self-pay | Admitting: Behavioral Health

## 2023-11-10 ENCOUNTER — Ambulatory Visit (INDEPENDENT_AMBULATORY_CARE_PROVIDER_SITE_OTHER): Admitting: Behavioral Health

## 2023-11-10 DIAGNOSIS — R454 Irritability and anger: Secondary | ICD-10-CM

## 2023-11-10 DIAGNOSIS — F99 Mental disorder, not otherwise specified: Secondary | ICD-10-CM

## 2023-11-10 DIAGNOSIS — F411 Generalized anxiety disorder: Secondary | ICD-10-CM | POA: Diagnosis not present

## 2023-11-10 DIAGNOSIS — F331 Major depressive disorder, recurrent, moderate: Secondary | ICD-10-CM

## 2023-11-10 DIAGNOSIS — F5105 Insomnia due to other mental disorder: Secondary | ICD-10-CM | POA: Diagnosis not present

## 2023-11-10 MED ORDER — VILAZODONE HCL 20 MG PO TABS: 20.0000 mg | ORAL_TABLET | Freq: Every day | ORAL | 1 refills | Status: DC

## 2023-11-10 MED ORDER — HYDROXYZINE HCL 25 MG PO TABS: 25.0000 mg | ORAL_TABLET | Freq: Three times a day (TID) | ORAL | 1 refills | Status: DC

## 2023-11-10 NOTE — Progress Notes (Signed)
 [facility]  Patient ID: KALEB LINQUIST,  MRN: [id number]  PCP: Chrystal Lamarr RAMAN, MD  Date of Evaluation: 11/10/2023 Time spent:20 minutes  Chief Complaint:  Chief Complaint   Depression; Anxiety; Follow-up; Medication Refill; Patient Education     HISTORY/CURRENT STATUS: HPI  53  year old female presents to this office via video visit for  follow up and medication management. Still doing very well overall with continued use of Viibryd .  Her anxiety and depression levels are manageable. For now she is happy with her medication and would like to continue with current regimen. She feels safe and verbally contracts for safety with this Clinical research associate. Acknowledges strong family support. Report anxiety 3/10 but says she is worried about the election. Depression is 2/10.  She is getting about 7-8 hours of sleep per night.  No mania, no psychosis. No SI/HI.    Wellbutrin - Insomnia, racing thoughts Stopped after 2 weeks.     Individual Medical History/ Review of Systems: Changes? :No   Allergies: Sertraline   Current Medications:  Current Outpatient Medications:    calcium-vitamin D  (OSCAL WITH D) 500-200 MG-UNIT tablet, Take 1 tablet by mouth., Disp: , Rfl:    Estradiol  10 MCG TABS vaginal tablet, INSERT 1 TABLET VAGINALLY 2TIMES A WEEK AT BEDTIME, Disp: 24 tablet, Rfl: 1   gabapentin  (NEURONTIN ) 100 MG capsule, Take 2 capsules (200 mg total) by mouth at bedtime., Disp: 60 capsule, Rfl: 3   gabapentin  (NEURONTIN ) 300 MG capsule, Take 1 capsule (300 mg total) by mouth 3 (three) times daily., Disp: 90 capsule, Rfl: 12   hydrOXYzine  (ATARAX ) 25 MG tablet, Take 1 tablet (25 mg total) by mouth 3 (three) times daily., Disp: 90 tablet, Rfl: 1   Levomefolate Glucosamine (METHYL-FOLATE PO), Take by mouth., Disp: , Rfl:    levothyroxine  (SYNTHROID ) 100 MCG tablet, Take 100 mcg by mouth every morning., Disp: , Rfl:    Magnesium Glycinate 120 MG CAPS, 325 mg., Disp: , Rfl:     RETIN-A 0.05 % cream, SMARTSIG:sparingly Topical Every Night, Disp: , Rfl:    tamoxifen  (NOLVADEX ) 20 MG tablet, TAKE 1 TABLET BY MOUTH ONCE DAILY, Disp: 30 tablet, Rfl: 2   Vilazodone  HCl 20 MG TABS, Take 1 tablet (20 mg total) by mouth daily after breakfast., Disp: 90 tablet, Rfl: 1   VTAMA 1 % CREA, Apply topically., Disp: , Rfl:  Medication Side Effects: none  Family Medical/ Social History: Changes? No  MENTAL HEALTH EXAM:  Last menstrual period 08/07/2015.There is no height or weight on file to calculate BMI.  General Appearance: Casual, Neat, and Well Groomed  Eye Contact:  Good  Speech:  Clear and Coherent  Volume:  Normal  Mood:  NA  Affect:  Appropriate  Thought Process:  Coherent  Orientation:  Full (Time, Place, and Person)  Thought Content: Logical   Suicidal Thoughts:  No  Homicidal Thoughts:  No  Memory:  WNL  Judgement:  Good  Insight:  Good  Psychomotor Activity:  Normal  Concentration:  Concentration: Good  Recall:  Good  Fund of Knowledge: Good  Language: Good  Assets:  Desire for Improvement  ADL's:  Intact  Cognition: WNL  Prognosis:  Good    DIAGNOSES:    ICD-10-CM   1. Generalized anxiety disorder  F41.1 Vilazodone  HCl 20 MG TABS    hydrOXYzine  (ATARAX ) 25 MG tablet    2. Major depressive disorder, recurrent episode, moderate (HCC)  F33.1 Vilazodone  HCl 20 MG TABS    3. Anger  reaction  R45.4 Vilazodone  HCl 20 MG TABS    4. Insomnia due to other mental disorder  F51.05 hydrOXYzine  (ATARAX ) 25 MG tablet   F99       Receiving Psychotherapy: No    RECOMMENDATIONS:  Greater than 50% of 20  min video visit  time with patient was spent on counseling and coordination of care.  We discussed her stability with anxiety and depression. Much improvement since last visit. She is utilizing healthy coping mechanisms. Continuing in therapy. NO current SI.  She understands her resources such as [facility], [place], [place], Suicide hotline, after hours number.   To continue Viibryd  20 mg daily. Must take with food.  To continue hydroxyzine  25 mg three times daily for anxiety and sleep at bedtime. May take 50 mg at bedtime if 25 mg not effective.  To report and side effects or worsening symptoms To follow up in 6  months to reassess per pt Provided emergency contact information Reviewed PDMP      Redell DELENA Pizza, NP

## 2023-11-21 ENCOUNTER — Other Ambulatory Visit: Payer: 59

## 2023-11-21 DIAGNOSIS — Z6825 Body mass index (BMI) 25.0-25.9, adult: Secondary | ICD-10-CM | POA: Diagnosis not present

## 2023-11-21 DIAGNOSIS — F325 Major depressive disorder, single episode, in full remission: Secondary | ICD-10-CM | POA: Diagnosis not present

## 2023-11-21 DIAGNOSIS — M8580 Other specified disorders of bone density and structure, unspecified site: Secondary | ICD-10-CM | POA: Diagnosis not present

## 2023-11-21 DIAGNOSIS — E663 Overweight: Secondary | ICD-10-CM | POA: Diagnosis not present

## 2023-11-21 DIAGNOSIS — G4700 Insomnia, unspecified: Secondary | ICD-10-CM | POA: Diagnosis not present

## 2023-11-21 DIAGNOSIS — E039 Hypothyroidism, unspecified: Secondary | ICD-10-CM | POA: Diagnosis not present

## 2023-11-22 DIAGNOSIS — F418 Other specified anxiety disorders: Secondary | ICD-10-CM | POA: Diagnosis not present

## 2023-11-22 DIAGNOSIS — F330 Major depressive disorder, recurrent, mild: Secondary | ICD-10-CM | POA: Diagnosis not present

## 2023-11-30 ENCOUNTER — Other Ambulatory Visit: Payer: Self-pay | Admitting: *Deleted

## 2023-11-30 DIAGNOSIS — C50412 Malignant neoplasm of upper-outer quadrant of left female breast: Secondary | ICD-10-CM

## 2023-11-30 NOTE — Progress Notes (Signed)
 [employer]atera renewal orders placed per MD request.

## 2023-12-05 ENCOUNTER — Other Ambulatory Visit: Payer: Self-pay | Admitting: Behavioral Health

## 2023-12-05 DIAGNOSIS — F5105 Insomnia due to other mental disorder: Secondary | ICD-10-CM

## 2023-12-05 DIAGNOSIS — F411 Generalized anxiety disorder: Secondary | ICD-10-CM

## 2023-12-08 DIAGNOSIS — F330 Major depressive disorder, recurrent, mild: Secondary | ICD-10-CM | POA: Diagnosis not present

## 2023-12-08 DIAGNOSIS — F418 Other specified anxiety disorders: Secondary | ICD-10-CM | POA: Diagnosis not present

## 2023-12-17 ENCOUNTER — Other Ambulatory Visit: Payer: Self-pay | Admitting: Hematology and Oncology

## 2023-12-20 DIAGNOSIS — F330 Major depressive disorder, recurrent, mild: Secondary | ICD-10-CM | POA: Diagnosis not present

## 2023-12-20 DIAGNOSIS — F418 Other specified anxiety disorders: Secondary | ICD-10-CM | POA: Diagnosis not present

## 2023-12-20 LAB — SIGNATERA ONLY (NATERA MANAGED)
SIGNATERA MTM READOUT: 0 MTM/ml
SIGNATERA TEST RESULT: NEGATIVE

## 2023-12-21 DIAGNOSIS — Z79899 Other long term (current) drug therapy: Secondary | ICD-10-CM | POA: Diagnosis not present

## 2023-12-21 DIAGNOSIS — Z13220 Encounter for screening for lipoid disorders: Secondary | ICD-10-CM | POA: Diagnosis not present

## 2023-12-26 DIAGNOSIS — E663 Overweight: Secondary | ICD-10-CM | POA: Diagnosis not present

## 2023-12-26 DIAGNOSIS — M8580 Other specified disorders of bone density and structure, unspecified site: Secondary | ICD-10-CM | POA: Diagnosis not present

## 2023-12-26 DIAGNOSIS — G4700 Insomnia, unspecified: Secondary | ICD-10-CM | POA: Diagnosis not present

## 2023-12-26 DIAGNOSIS — N289 Disorder of kidney and ureter, unspecified: Secondary | ICD-10-CM | POA: Diagnosis not present

## 2023-12-26 DIAGNOSIS — F325 Major depressive disorder, single episode, in full remission: Secondary | ICD-10-CM | POA: Diagnosis not present

## 2023-12-26 DIAGNOSIS — Z6825 Body mass index (BMI) 25.0-25.9, adult: Secondary | ICD-10-CM | POA: Diagnosis not present

## 2023-12-26 DIAGNOSIS — E039 Hypothyroidism, unspecified: Secondary | ICD-10-CM | POA: Diagnosis not present

## 2024-01-05 DIAGNOSIS — F330 Major depressive disorder, recurrent, mild: Secondary | ICD-10-CM | POA: Diagnosis not present

## 2024-01-05 DIAGNOSIS — F418 Other specified anxiety disorders: Secondary | ICD-10-CM | POA: Diagnosis not present

## 2024-01-09 ENCOUNTER — Ambulatory Visit: Attending: Surgery

## 2024-01-09 VITALS — Wt 146.2 lb

## 2024-01-09 DIAGNOSIS — Z483 Aftercare following surgery for neoplasm: Secondary | ICD-10-CM | POA: Insufficient documentation

## 2024-01-09 NOTE — Therapy (Signed)
 OUTPATIENT PHYSICAL THERAPY SOZO SCREENING NOTE   Patient Name: Michaela Roman MRN: [id number] DOB:05-Mar-1970, 53 y.o., female Today's Date: 01/09/2024  PCP: Chrystal Lamarr RAMAN, MD REFERRING PROVIDER: Vanderbilt Ned, MD   PT End of Session - 01/09/24 [phone]     Visit Number 6   # unchanged due to screen only   PT Start Time 0843    PT Stop Time 0847    PT Time Calculation (min) 4 min    Activity Tolerance Patient tolerated treatment well    Behavior During Therapy [facility] for tasks assessed/performed           Past Medical History:  Diagnosis Date   Anxiety    Arthritis    OP   Breast cancer (HCC)    Depression    Family history of breast cancer 02/13/2020   History of radiation therapy 04/17/20-06/03/20   IMRT to Left breast Dr. 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   Personal history of radiation therapy    Premature ovarian failure    Dx'd age 18   Thyroid  disease    hypothyroid   Past Surgical History:  Procedure Laterality Date   BREAST BIOPSY Left 02/04/2020   breast and lymphnode   BREAST LUMPECTOMY Left 03/12/2020   BREAST LUMPECTOMY WITH RADIOACTIVE SEED AND SENTINEL LYMPH NODE BIOPSY Left 03/12/2020   Procedure: LEFT BREAST LUMPECTOMY WITH RADIOACTIVE SEED AND LEFT TARGETED RADIOACTIVE SEED LYMPH NODE BIOPSY AND LEFT SENTINEL LYMPH NODE MAPPING;  Surgeon: Vanderbilt Ned, MD;  Location: [facility];  Service: General;  Laterality: Left;   DILATATION & CURETTAGE/HYSTEROSCOPY WITH MYOSURE N/A 10/07/2015   Procedure: DILATATION & CURETTAGE/HYSTEROSCOPY WITH MYOSURE;  Surgeon: Bobie FORBES Cathlyn JAYSON Nikki, MD;  Location: [facility];  Service: Gynecology;  Laterality: N/A;  endometrial polyp   SCAR REVISION Left 02/05/2021   Procedure: left breast SCAR REVISION;  Surgeon: Vanderbilt Ned, MD;  Location: [facility];  Service: General;  Laterality: Left;  61   WISDOM TOOTH EXTRACTION     Patient Active  Problem List   Diagnosis Date Noted   Bilateral carpal tunnel syndrome 07/22/2020   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 Patient underwent a left lumpectomy and sentinel node biopsy (1/4 positive) on 03/12/2020. It is ER/PR positive and HER2 negative with a Ki67 of 30%.     PRECAUTIONS: left UE Lymphedema risk,   SUBJECTIVE: here for 6 month SOZO  PAIN:  Are you having pain? No  SOZO SCREENING: Patient was assessed today using the SOZO machine to determine the lymphedema index score. This was compared to her baseline score. It was determined that she is within the recommended range when compared to her baseline and no further action is needed at this time. She will continue SOZO screenings. These are done every 3 months for 2 years post operatively followed by every 6 months for 2 years, and then annually.   L-DEX FLOWSHEETS -  01/09/24 0800       L-DEX LYMPHEDEMA SCREENING   Measurement Type Unilateral    L-DEX MEASUREMENT EXTREMITY Upper Extremity    POSITION  Standing    DOMINANT SIDE Right    At Risk Side Left    BASELINE SCORE (UNILATERAL) -0.7    L-DEX SCORE (UNILATERAL) -2    VALUE CHANGE (UNILAT) -1.3          P: Transition to annual L-Dex screens.    Aden Berwyn Caldron, PTA 01/09/2024, 8:45 AM

## 2024-01-10 DIAGNOSIS — R945 Abnormal results of liver function studies: Secondary | ICD-10-CM | POA: Diagnosis not present

## 2024-01-10 DIAGNOSIS — E039 Hypothyroidism, unspecified: Secondary | ICD-10-CM | POA: Diagnosis not present

## 2024-01-10 DIAGNOSIS — R944 Abnormal results of kidney function studies: Secondary | ICD-10-CM | POA: Diagnosis not present

## 2024-01-17 DIAGNOSIS — F418 Other specified anxiety disorders: Secondary | ICD-10-CM | POA: Diagnosis not present

## 2024-01-17 DIAGNOSIS — F330 Major depressive disorder, recurrent, mild: Secondary | ICD-10-CM | POA: Diagnosis not present

## 2024-01-24 DIAGNOSIS — M8580 Other specified disorders of bone density and structure, unspecified site: Secondary | ICD-10-CM | POA: Diagnosis not present

## 2024-01-24 DIAGNOSIS — E663 Overweight: Secondary | ICD-10-CM | POA: Diagnosis not present

## 2024-01-24 DIAGNOSIS — F325 Major depressive disorder, single episode, in full remission: Secondary | ICD-10-CM | POA: Diagnosis not present

## 2024-01-24 DIAGNOSIS — E039 Hypothyroidism, unspecified: Secondary | ICD-10-CM | POA: Diagnosis not present

## 2024-01-24 DIAGNOSIS — G4700 Insomnia, unspecified: Secondary | ICD-10-CM | POA: Diagnosis not present

## 2024-01-24 DIAGNOSIS — N289 Disorder of kidney and ureter, unspecified: Secondary | ICD-10-CM | POA: Diagnosis not present

## 2024-01-24 DIAGNOSIS — Z6825 Body mass index (BMI) 25.0-25.9, adult: Secondary | ICD-10-CM | POA: Diagnosis not present

## 2024-01-31 DIAGNOSIS — F330 Major depressive disorder, recurrent, mild: Secondary | ICD-10-CM | POA: Diagnosis not present

## 2024-01-31 DIAGNOSIS — F418 Other specified anxiety disorders: Secondary | ICD-10-CM | POA: Diagnosis not present

## 2024-02-03 ENCOUNTER — Other Ambulatory Visit: Payer: Self-pay | Admitting: Obstetrics and Gynecology

## 2024-02-03 DIAGNOSIS — N952 Postmenopausal atrophic vaginitis: Secondary | ICD-10-CM

## 2024-02-03 NOTE — Telephone Encounter (Signed)
 Med refill request: estradiol  10 mcg vaginal tablet Last AEX: 03/16/23 Next AEX: 03/22/24 Last MMG (if hormonal med) 09/16/23 BI-RADS 2 benign Refill authorized: estradiol  10 mcg vaginal tablet #24, zero refills.

## 2024-02-14 DIAGNOSIS — F418 Other specified anxiety disorders: Secondary | ICD-10-CM | POA: Diagnosis not present

## 2024-02-14 DIAGNOSIS — F330 Major depressive disorder, recurrent, mild: Secondary | ICD-10-CM | POA: Diagnosis not present

## 2024-02-21 DIAGNOSIS — Z6824 Body mass index (BMI) 24.0-24.9, adult: Secondary | ICD-10-CM | POA: Diagnosis not present

## 2024-02-21 DIAGNOSIS — F325 Major depressive disorder, single episode, in full remission: Secondary | ICD-10-CM | POA: Diagnosis not present

## 2024-02-21 DIAGNOSIS — E663 Overweight: Secondary | ICD-10-CM | POA: Diagnosis not present

## 2024-02-21 DIAGNOSIS — N289 Disorder of kidney and ureter, unspecified: Secondary | ICD-10-CM | POA: Diagnosis not present

## 2024-02-21 DIAGNOSIS — G4700 Insomnia, unspecified: Secondary | ICD-10-CM | POA: Diagnosis not present

## 2024-02-21 DIAGNOSIS — E039 Hypothyroidism, unspecified: Secondary | ICD-10-CM | POA: Diagnosis not present

## 2024-02-21 DIAGNOSIS — M8580 Other specified disorders of bone density and structure, unspecified site: Secondary | ICD-10-CM | POA: Diagnosis not present

## 2024-02-26 ENCOUNTER — Other Ambulatory Visit: Payer: Self-pay | Admitting: Behavioral Health

## 2024-02-26 DIAGNOSIS — F5105 Insomnia due to other mental disorder: Secondary | ICD-10-CM

## 2024-02-26 DIAGNOSIS — F411 Generalized anxiety disorder: Secondary | ICD-10-CM

## 2024-02-27 ENCOUNTER — Other Ambulatory Visit: Payer: Self-pay | Admitting: Behavioral Health

## 2024-02-27 DIAGNOSIS — F411 Generalized anxiety disorder: Secondary | ICD-10-CM

## 2024-02-27 DIAGNOSIS — R454 Irritability and anger: Secondary | ICD-10-CM

## 2024-02-27 DIAGNOSIS — F331 Major depressive disorder, recurrent, moderate: Secondary | ICD-10-CM

## 2024-03-15 ENCOUNTER — Other Ambulatory Visit: Payer: Self-pay | Admitting: *Deleted

## 2024-03-15 MED ORDER — TAMOXIFEN CITRATE 20 MG PO TABS: 20.0000 mg | ORAL_TABLET | Freq: Every day | ORAL | 2 refills | Status: DC

## 2024-03-17 ENCOUNTER — Other Ambulatory Visit: Payer: Self-pay | Admitting: Hematology and Oncology

## 2024-03-22 ENCOUNTER — Ambulatory Visit: Payer: 59 | Admitting: Obstetrics and Gynecology

## 2024-03-26 ENCOUNTER — Ambulatory Visit: Admitting: Obstetrics and Gynecology

## 2024-05-09 ENCOUNTER — Ambulatory Visit: Admitting: Behavioral Health

## 2024-05-21 ENCOUNTER — Ambulatory Visit: Admitting: Obstetrics and Gynecology

## 2024-10-04 ENCOUNTER — Ambulatory Visit: Admitting: Hematology and Oncology

## 2025-01-07 ENCOUNTER — Ambulatory Visit
